# Patient Record
Sex: Male | Born: 1995 | Race: White | Hispanic: No | Marital: Single | Smoking: Never smoker
Health system: Southern US, Community
[De-identification: ages and names within clinical notes are randomized; demographics above are authoritative.]

## PROBLEM LIST (undated history)

## (undated) DIAGNOSIS — F840 Autistic disorder: Secondary | ICD-10-CM

## (undated) DIAGNOSIS — J309 Allergic rhinitis, unspecified: Secondary | ICD-10-CM

## (undated) DIAGNOSIS — E119 Type 2 diabetes mellitus without complications: Secondary | ICD-10-CM

## (undated) DIAGNOSIS — F909 Attention-deficit hyperactivity disorder, unspecified type: Secondary | ICD-10-CM

## (undated) HISTORY — PX: DENTAL REHABILITATION: SHX1449

## (undated) HISTORY — DX: Type 2 diabetes mellitus without complications: E11.9

---
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
## 2017-01-03 ENCOUNTER — Telehealth (INDEPENDENT_AMBULATORY_CARE_PROVIDER_SITE_OTHER): Payer: Self-pay | Admitting: "Endocrinology

## 2017-01-03 NOTE — Telephone Encounter (Signed)
1. I had asked that Ms. Barnes call me this evening between 8:00-9:30 PM. When she had not called by 9:15 I called her. Unfortunately, she was not available.  2. I left a voicemail message asking her to return my call either this evening or tomorrow evening.  Molli KnockMichael Brennan, MD, CDE

## 2017-02-27 ENCOUNTER — Ambulatory Visit (INDEPENDENT_AMBULATORY_CARE_PROVIDER_SITE_OTHER): Payer: Medicaid Other | Admitting: "Endocrinology

## 2017-03-04 ENCOUNTER — Encounter (INDEPENDENT_AMBULATORY_CARE_PROVIDER_SITE_OTHER): Payer: Self-pay | Admitting: "Endocrinology

## 2017-03-04 ENCOUNTER — Ambulatory Visit (INDEPENDENT_AMBULATORY_CARE_PROVIDER_SITE_OTHER): Payer: Medicaid Other | Admitting: "Endocrinology

## 2017-03-04 VITALS — BP 118/76 | HR 90 | Wt 152.4 lb

## 2017-03-04 DIAGNOSIS — IMO0001 Reserved for inherently not codable concepts without codable children: Secondary | ICD-10-CM

## 2017-03-04 DIAGNOSIS — E1065 Type 1 diabetes mellitus with hyperglycemia: Secondary | ICD-10-CM

## 2017-03-04 DIAGNOSIS — E10649 Type 1 diabetes mellitus with hypoglycemia without coma: Secondary | ICD-10-CM | POA: Diagnosis not present

## 2017-03-04 DIAGNOSIS — M25579 Pain in unspecified ankle and joints of unspecified foot: Secondary | ICD-10-CM

## 2017-03-04 DIAGNOSIS — F4320 Adjustment disorder, unspecified: Secondary | ICD-10-CM

## 2017-03-04 DIAGNOSIS — E063 Autoimmune thyroiditis: Secondary | ICD-10-CM

## 2017-03-04 DIAGNOSIS — F840 Autistic disorder: Secondary | ICD-10-CM

## 2017-03-04 DIAGNOSIS — E049 Nontoxic goiter, unspecified: Secondary | ICD-10-CM | POA: Diagnosis not present

## 2017-03-04 LAB — POCT URINALYSIS DIPSTICK
GLUCOSE UA: 1000
Ketones, UA: NEGATIVE

## 2017-03-04 LAB — POCT GLUCOSE (DEVICE FOR HOME USE): POC Glucose: 494 mg/dl — AB (ref 70–99)

## 2017-03-04 NOTE — Progress Notes (Signed)
Subjective:  Patient Name: Johnny [name] Date of Birth: August 05, 1995  MRN: [id number]  Johnny [name]  presents to the office today for follow-up evaluation and management  of his T1DM, hypoglycemia, autism, ADHD, possible mental retardation, goiter, adjustment reaction, and family history of autoimmune thyroid disease.  HISTORY OF PRESENT ILLNESS:   Johnny [name] is a 21 y.o. Caucasian young man.   Johnny [name] was accompanied by his mother and her husband, Johnny [name].  1. Johnny [name] was seen at [facility] in [place], [place], on 06/26/13 for complaints of weight loss, fatigue, personality change, polyuria, polydipsia, and vomiting. New-onset DM and DKA were diagnosed. 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 Significantly abnormal lab results included: Venous pH 7.257, serum potassium 3.0, serum CO2 15, serum glucose 257, hemoglobin A1c 13.7%, C-peptide 0.19 (normal 0.80-3.09), anti-glutamic acid decarboxylase (GAD) antibody > 30 (normal < 1.0), anti-islet cell antibody 80 (normal < 5). TSH was 2.163, free T4 0.82, free T3 1.8 (normal 2.3-4.2). He was treated initially with infusions of insulin and fluids. When his DKA resolved, he was converted to a multiple daily injection (MDI) insulin plan, using Lantus as a basal insulin and Novolog aspart as a bolus insulin at mealtimes, bedtime, and 2 AM if needed. 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   2. Johnny [name] was discharged from [facility] on 07/04/13. He and his mother had our DSSP education program with Ms. Gearldine Bienenstock. 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 When he went into the honeymoon period his Lantus dose was decreased to 6 units at night.  Since then, however, he has exited the honeymoon period and has had a gradual but progressive increase in his exogenous insulin requirement. He started his Dexcom CGM in early 2016. He was converted to an Omnipod pump on 01/16/16.   3. Johnny [name]'s last [facility] visit was on 12/26/16. At that visit I changed several basal rates. Mom says that those changes did help  A. In the interim he has been healthy. His allergies have been active every day. He has not been bothered by headaches.    B. His Omnipod pump and Dexcom have been working well.   C. BGs have been generally lower.    Johnny [name] has become increasingly more interactive with others and has had noticeable improvements in his alertness and his speech, but he remains completely dependent on his mother for all of his basic needs.    4. [name]:  Constitutional: Johnny [name] has been feeling "1 thumb up".   Eyes: Vision seems to be good for distance vision when he wears his new glasses. He is wearing his glasses more often now. He had his last eye appointment in about January 2018. New glasses were prescribed. The doctor did not indicate any other problems with his eyes.  Neck: Johnny [name] has not been touching his neck or complaining of neck pains. There are no other recognized problems of the anterior neck.  Heart: There are no recognized heart problems. The ability to perform physical activities seems normal for his limited  level of activity.  Gastrointestinal: He is still hungry. Bowel movents seem normal. There are no recognized GI problems. Legs: Muscle mass and strength seem normal. He can  perform his usual ADLs and other physical activities without obvious discomfort. He has no numbness, tingling, burning, or pains. No edema is noted.  Feet: He still complains a lot about his feet hurting when he is active. There are no obvious foot problems. No edema is noted.  Neurologic: There are no recognized problems with muscle movement and  strength, sensation, or coordination. Hypoglycemia: Mom says that he has had a few low BGs when he goes too long without eating or is unusually physically active. Mom says that he had a 57, but she can't remember when.      5. BG meter printout: He checks his BGs 5-8 times daily. Average BG was 182, compared with 232 at his last visit. BG range was 73-397, compared with 30-450 at his last visit. Average morning BG was 156, lunch 190, dinner 194, and bedtime 183. He was on vacation last week, was eating out at restaurants most of the time, and his BGs were higher, especially on 02/28/17.   6. Dexcom printout: The sensor shows that his skin glucose values correlate pretty well with his BGs. His BGs were definitely higher while on vacation, but much lower and less variable before vacation.     PAST MEDICAL, FAMILY, AND SOCIAL HISTORY    Past Medical History:  Diagnosis Date  . ADHD (attention deficit hyperactivity disorder)   . Allergic rhinitis   . Autism   . Diabetes mellitus without complication (HCC)     Family History  Problem Relation Age of Onset  . Hypothyroidism Mother   . Kidney disease Mother        Mom unsure what her kidney issues are  . Autism Sister   . Mental retardation Sister   . Cancer Maternal Grandfather   . Diabetes Paternal Grandmother        Died at age 51.  . Other Other        Paternal great aunt with "hypoglycemia" unsure why     Current Outpatient Prescriptions:  .  ACCU-CHEK FASTCLIX LANCETS MISC, CHECK BLOOD SUGAR 6 TIMES DAILY, Disp: 204 each, Rfl: 6 .  clindamycin-benzoyl peroxide (BENZACLIN) gel, Apply 1 application topically at bedtime., Disp: , Rfl:  .  doxycycline (VIBRA-TABS) 100 MG tablet, Take 100 mg by mouth daily. Continuous for acne, Disp: , Rfl:  .  GLUCAGEN HYPOKIT 1 MG SOLR injection, INJECT 1 SYRINGE IN THE MUSCLE IF UNRESPONSIVE, UNABLE TO SWALLOW, UNCONSCIOUS AND/OR HAS SEIZURE FOR SEVERE HYPOGLYCEMIA, Disp: 2 mg, Rfl: 0 .  insulin  aspart (NOVOLOG) 100 UNIT/ML injection, Use 200 units in insulin pump every 48 hours (Patient taking differently: Inject into the skin See admin instructions. Use 200 units in insulin pump every 48 hours), Disp: 40 mL, Rfl: 6 .  montelukast (SINGULAIR) 10 MG tablet, Take 10 mg by mouth daily., Disp: , Rfl:  .  tretinoin (RETIN-A) 0.025 % cream, Apply topically at bedtime. Apply sparingly dor acne. May start twice a week and then increase to daily in 1-2 weeks, Disp: 45 g, Rfl: 1 .  Urine Glucose-Ketones Test STRP, If urine output and extreme thirst increases, collect urine and check with strip as directed. Disp 1 vial, Disp: 20 each, Rfl: 3 .  ACCU-CHEK SMARTVIEW test strip, TEST BLOOD SUGAR 6 TIMES A DAY (Patient not taking: Reported on 12/26/2016), Disp: 200 each, Rfl: 6 .  BD PEN NEEDLE NANO U/F 32G X 4 MM MISC, INJECT INSULIN 7 TIMES DAILY (Patient not taking: Reported on 12/26/2016), Disp: 300 each,  Rfl: 0 .  diphenhydrAMINE (BENADRYL) 25 MG tablet, Take 50 mg by mouth every 6 (six) hours as needed for itching or allergies., Disp: , Rfl:  .  hydrOXYzine (ATARAX/VISTARIL) 25 MG tablet, Take 50 mg by mouth at bedtime as needed for anxiety (sleep). Reported on 12/21/2015, Disp: , Rfl:  .  LANTUS SOLOSTAR 100 UNIT/ML Solostar Pen, INJECT UP TO 50 UNITS UNDER THE SKIN EVERY DAY AS DIRECTED (Patient not taking: Reported on 06/27/2016), Disp: 15 mL, Rfl: 0 .  NOVOLOG FLEXPEN 100 UNIT/ML FlexPen, INJECT UP TO 50 UNITS UNDER THE SKIN EVERYDAY (Patient not taking: Reported on 09/25/2016), Disp: 15 mL, Rfl: 0  Allergies as of 03/04/2017  . (No Known Allergies)     reports that he has never smoked. He has never used smokeless tobacco. He reports that he does not drink alcohol. Pediatric History  Patient Guardian Status  . Mother:  [name]   Other Topics Concern  . Not on file   Social History Narrative   Lives with Mom and Sister (17y).  Mom quit smoking 18 years ago.  She has 2 other grown children.   They have a dog at home.  No recent travel.   1. School and Family: He will in the 12th grade again in a self-contained special education program. He has health insurance through [facility]. Mom has new health insurance as well. She has also lost 70 pounds. Mr. Zachery Dauer is back living with the family.  2. Activities: His play and video games.  3. Primary Care Provider: His medical home is the [facility]. He has a new PCP now, but mom can't remember her name.  4. Psychiatrist: None at present  REVIEW OF SYSTEMS: There are no other significant problems involving Johnny [name]'s other body systems.   Objective:  Vital Signs:  BP 118/76   Pulse 90   Wt 152 lb 6.4 oz (69.1 kg)   BMI 23.87 kg/m    Ht Readings from Last 3 Encounters:  11/11/16 5\' 7"  (1.702 m) (19 %, Z= -0.88)*  09/25/16 5' 6.73" (1.695 m) (17 %, Z= -0.97)*  06/27/16 5' 6.77" (1.696 m) (18 %, Z= -0.93)*   * Growth percentiles are based on CDC 2-20 Years data.   Wt Readings from Last 3 Encounters:  03/04/17 152 lb 6.4 oz (69.1 kg) (50 %, Z= 0.00)*  12/26/16 148 lb (67.1 kg) (44 %, Z= -0.15)*  11/11/16 145 lb (65.8 kg) (40 %, Z= -0.26)*   * Growth percentiles are based on CDC 2-20 Years data.   HC Readings from Last 3 Encounters:  No data found for [facility]   Body surface area is 1.81 meters squared.  No height on file for this encounter. 50 %ile (Z= 0.00) based on CDC 2-20 Years weight-for-age data using vitals from 03/04/2017. No head circumference on file for this encounter.   PHYSICAL EXAM:  Constitutional: The patient appears healthy and well nourished. His height is plateauing. He gained 4 pounds since last visit. He again played with his video game during most of today's clinic visit. He engaged very well with me today. He was very friendly, upbeat, and polite. He continues to improve in terms of being more interactive with others. His speech is better today. His affect was quite normal today, but his insight  remains fairly limited. Head: The head is normocephalic. Face: His pustular acne has improved. There are no obvious dysmorphic features. He has a grade 2-3 mustache and more of a goatee  today.   Eyes: The eyes appear to be normally formed and spaced. Gaze is conjugate. There is no obvious arcus or proptosis. Moisture appears normal. Ears: The ears are normally placed and appear externally normal. Mouth: The oropharynx and tongue appear normal. Dentition appears to be normal for age. Oral moisture is normal. Neck: The neck appears to be visibly normal. No carotid bruits are noted. His strap muscles are larger. The thyroid gland is again enlarged at about 22-23 grams in size. Both lobes are symmetrically enlarged today. The consistency of the thyroid gland is relatively firm.  The thyroid gland is not tender to palpation. Lungs: The lungs are clear to auscultation. Air movement is good. Heart: Heart rate and rhythm are regular. Heart sounds S1 and S2 are normal. I did not appreciate any pathologic cardiac murmurs. Abdomen: The abdomen is normal in size for the patient's age, but larger. Bowel sounds are normal. There is no obvious hepatomegaly, splenomegaly, or other mass effect.  Arms: Muscle size and bulk are normal for age. Hands: There is no obvious tremor. Phalangeal and metacarpophalangeal joints are normal. Palmar muscles are normal for age. Palmar skin is normal. Palmar moisture is also normal. Legs: Muscles appear normal for age. No edema is present. Feet: Feet are normally formed. Dorsalis pedal pulses are 1+ bilaterally.  I do not see any tinea pedis.  Neurologic: Strength is fairly normal for age in both the upper and lower extremities. Muscle tone is normal. Sensation to touch is normal in both the legs and feet.    LAB DATA: Results for orders placed or performed in visit on 03/04/17 (from the past 504 hour(s))  POCT Glucose (Device for Home Use)   Collection Time: 03/04/17 10:30 AM   Result Value Ref Range   Glucose Fasting, POC  70 - 99 mg/dL   POC Glucose 161 (A) 70 - 99 mg/dl    Labs 0/96/04: CBG 540. He had not bolused after breakfast today due to mom putting in a new pod just before they left home to drive to his appointment today.  Labs 12/26/16: HbA1c 10.2%, CBG 289  Labs 09/25/16: HbA1c 9.5%, CBG 261  Labs 06/27/16: CBG 114; TSH 1.54, free T4 1.1, free T3 3.4; CMP normal; urine microalbumin/creatinine ratio 2; cholesterol 108, triglycerides 153, HDL 43, LDL 34  Labs 04/10/16: HbA1c 9.5%, CBG 145  Labs 02/01/16: CBG 391, TSH 1.72  Labs 12/21/15: HbA1c 8.4%  Labs 10/10/15: HbA1c 8.2%.  Labs 08/09/15: HbA1c 8.5%  Labs 1012/16: HbA1c 8.8%; TSH 2.624, free T4 0.99, free T3 3.4; urine microalbumin/creatinine ratio <0.2; CMP normal  Labs 01/27/15: HbA1c 8.8%  Labs 10/25/14: Hemoglobin A1c 8.2%, compared with 7.9% at the last visit and with 8.4% at the prior visit.    Labs 10/22/13: CMP normal; C-peptide 0.65, increased from 0.19 at diagnosis (normal 0.80-3.90); TSH 1.265, free T4 1.01, free T3 3.4, TPO antibody 10.4    Assessment and Plan:   ASSESSMENT:  1. T1DM:   A. Since exiting the honeymoon period, Johnny [name] has required more and more exogenous insulin than he needed before. His BGs had been much more variable and had increased overall through his last visit.    B. In the past two months, however, his BGs have significantly improved, in part due to increases in basal rates, in part due to increased physical activity, and in part due to him cooperating more with mom in terms of what he eats and how much he eats. Mom is also doing  daily food logs that help her understand why BGs can change more at some times than at others. Mom continues to do a wonderful job of supporting and supervising Johnny [name].  2. Hypoglycemia: He has not had any documented low BGs this month, but mom thinks that he did have a 49 since his last visit.  3-4. Goiter/thyroiditis:   A. He has the  personal history of autoimmune T1DM. He also has the family history of autoimmune thyroid disease.  B. He continues to have waxing and waning of thyroid gland size. At his last visit the thyroid gland was larger overall, but is somewhat smaller today.   C. The pattern of waxing and waning of thyroid gland size and thyroid lobe sizes is c/w evolving Hashimoto's thyroiditis. It is almost a certainty that he will become hypothyroid in the future.   D.  He was euthyroid in April 2015 and in October 2016. His TSH was mid-range euthyroid in both July and December 2017.  5. Adjustment reaction: Things have been going pretty well in the past 3 months. Mom and Johnny [name] are working well together.     6. Autism/ADHD/ mental retardation: Kalven's autism continues to improve in that he is more social and outgoing. However, he remains completely dependent upon mom for his meals, shelter, clothing, recreational activities, physical activities, and T1DM care. 7. Dehydration: He looks well hydrated today. It appears that when Johnny [name]'s BGs are high he sometimes does not drink enough liquid to meet his needs, and so becomes dehydrated.  8. Foot pain: Johnny [name] appears to need better arch support than his sandals are providing now.   PLAN:  1. Diagnostic: Call Dr. Fransico MichaelBrennan on Sunday evening in mid-September to discuss BGs.  2. Therapeutic: Continue Small bedtime snack.  Continue current pump settings, to include his current basal rates: MN: 1.25 4 AM: 1.30 8 AM: 1.10 New Noon: 0.90 6 PM: 1.15 3. Patient education: We discussed his elevated BGs, his hypoglycemia, his goiter, and all of the above at length. Mom will call me if Johnny [name] has any BGs during the night or in the mornings that are <80 and unexplained.  4. Follow-up: two months  Level of Service: This visit lasted in excess of 50 minutes. More than 50% of the visit was devoted to counseling.   Molli KnockMichael Brennan, MD, CDE Adult and Pediatric  Endocrinology

## 2017-03-04 NOTE — Patient Instructions (Addendum)
Follow up visit in 2 months. Call on a Sunday evening in mid-September between 8:00-9:30 PM to discuss BGs.

## 2017-04-09 ENCOUNTER — Other Ambulatory Visit (INDEPENDENT_AMBULATORY_CARE_PROVIDER_SITE_OTHER): Payer: Self-pay | Admitting: "Endocrinology

## 2017-04-09 DIAGNOSIS — IMO0001 Reserved for inherently not codable concepts without codable children: Secondary | ICD-10-CM

## 2017-04-09 DIAGNOSIS — E1065 Type 1 diabetes mellitus with hyperglycemia: Principal | ICD-10-CM

## 2017-04-18 ENCOUNTER — Telehealth (INDEPENDENT_AMBULATORY_CARE_PROVIDER_SITE_OTHER): Payer: Self-pay | Admitting: "Endocrinology

## 2017-04-18 NOTE — Telephone Encounter (Signed)
Returned TC to [place] to check on [place], mom said that he is doing better no ketones but his sugars are still up. Advised to continue to check his bg and give boluses.

## 2017-04-18 NOTE — Telephone Encounter (Signed)
Spoke with Ms. Johnny [name], she said his pod stopped working last night and he did not tell her until this morning. She changed his Pod with new insulin and gave him a 15 unit bolus, but was concern because his sugar was reading Hi and had +80 Ketones. Now his Bg is 484, advise to follow DKA protocol, continue to give him fluids and check his urine ketones. Mom ok with information given. Advised that I will check back with her later today to see how he is doing.

## 2017-04-18 NOTE — Telephone Encounter (Signed)
°  Who's calling (name and relationship to patient) : [name], mother Best contact number: [phone] Provider they see: Fransico Michael Reason for call: Stating patient's ketones are 80 and blood sugar is high. Routing phone call to [place].     PRESCRIPTION REFILL ONLY  Name of prescription:  Pharmacy:

## 2017-05-07 ENCOUNTER — Ambulatory Visit (INDEPENDENT_AMBULATORY_CARE_PROVIDER_SITE_OTHER): Payer: Medicaid Other | Admitting: "Endocrinology

## 2017-05-07 ENCOUNTER — Encounter (INDEPENDENT_AMBULATORY_CARE_PROVIDER_SITE_OTHER): Payer: Self-pay | Admitting: "Endocrinology

## 2017-05-07 VITALS — BP 98/68 | HR 80 | Ht 66.93 in | Wt 153.6 lb

## 2017-05-07 DIAGNOSIS — E049 Nontoxic goiter, unspecified: Secondary | ICD-10-CM

## 2017-05-07 DIAGNOSIS — IMO0001 Reserved for inherently not codable concepts without codable children: Secondary | ICD-10-CM

## 2017-05-07 DIAGNOSIS — F4320 Adjustment disorder, unspecified: Secondary | ICD-10-CM

## 2017-05-07 DIAGNOSIS — E860 Dehydration: Secondary | ICD-10-CM | POA: Diagnosis not present

## 2017-05-07 DIAGNOSIS — E10649 Type 1 diabetes mellitus with hypoglycemia without coma: Secondary | ICD-10-CM

## 2017-05-07 DIAGNOSIS — E1065 Type 1 diabetes mellitus with hyperglycemia: Secondary | ICD-10-CM

## 2017-05-07 LAB — POCT GLUCOSE (DEVICE FOR HOME USE): POC Glucose: 246 mg/dl — AB (ref 70–99)

## 2017-05-07 LAB — POCT GLYCOSYLATED HEMOGLOBIN (HGB A1C): Hemoglobin A1C: 9.3

## 2017-05-07 NOTE — Progress Notes (Signed)
Subjective:  Patient Name: Johnny [name] Date of Birth: 1996/02/08  MRN: [id number]  Johnny [name]  presents to the office today for follow-up evaluation and management  of his T1DM, hypoglycemia, autism, ADHD, possible mental retardation, goiter, adjustment reaction, and family history of autoimmune thyroid disease.  HISTORY OF PRESENT ILLNESS:   Johnny [name] is a 21 y.o. Caucasian young man.   Johnny [name] was accompanied by his mother and her husband, Johnny [name].  1. Johnny [name] was seen at [facility] in [place], [place], on 06/26/13 for complaints of weight loss, fatigue, personality change, polyuria, polydipsia, and vomiting. New-onset DM and DKA were diagnosed. 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 Significantly abnormal lab results included: Johnny [name] 7.257, serum potassium 3.0, serum CO2 15, serum glucose 257, hemoglobin A1c 13.7%, C-peptide 0.19 (normal 0.80-3.09), anti-glutamic acid decarboxylase (GAD) antibody > 30 (normal < 1.0), anti-islet cell antibody 80 (normal < 5). TSH was 2.163, free T4 0.82, free T3 1.8 (normal 2.3-4.2). He was treated initially with infusions of insulin and fluids. When his DKA resolved, he was converted to a multiple daily injection (MDI) insulin plan, using Lantus as a basal insulin and Novolog aspart as a bolus insulin at mealtimes, bedtime, and 2 AM if needed. 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   2. [name] was discharged from [facility] on 07/04/13. He and his mother had our DSSP education program with Ms. Gearldine Bienenstock. 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 When he went into the honeymoon period his Lantus dose was decreased to 6 units at night.  Since then, however, he has exited the honeymoon period and has had a gradual but progressive increase in his exogenous insulin requirement. He started his Dexcom CGM in early 2016. He was converted to an Omnipod pump on 01/16/16.   3. [name]'s last [facility] visit was on 03/04/17. At that visit I continued his current insulin pump settings.   A. In the interim he has been healthy. At school he had a few high BGs and the school wanted to call EMS. BGs were High.   B. His allergies have been active every day. He has not been bothered by headaches.    C. His Omnipod pump has been working well. His Dexcom 5 is not working so well. His SGs and BGs can be 50-100 points off. BGs have been generally good, but he has had more higher BGs than low BGs.  [place] [name] has become increasingly more interactive with others and has had noticeable improvements in his alertness and his speech, but he still remains completely dependent on his mother for all of his basic needs.    4. [name]:  Constitutional: Johnny [name] has been feeling "1 thumb up".   Eyes: Vision seems to be good for distance vision when he wears his new glasses. He is wearing his glasses more often now. He had his last eye appointment in about January 2018. New glasses were prescribed. The doctor did not indicate any other problems with his eyes.  Neck: Johnny [name] has not been touching his neck or complaining of neck pains. There are no other recognized problems of the anterior neck.  Heart: There are no recognized heart problems. The ability  to perform physical activities seems normal for his limited  level of activity.  Gastrointestinal: He is still hungry. Bowel movents seem normal. There are no recognized GI problems. Legs: Muscle mass and strength seem normal. He can perform his usual ADLs and other physical activities without obvious discomfort. He has no numbness, tingling, burning, or pains. No edema is noted.  Feet: There are no obvious  foot problems. No edema is noted.  Neurologic: There are no recognized problems with muscle movement and strength, sensation, or coordination. Hypoglycemia: Johnny [name] says that he has had a few low BGs.        5. BG meter printout: He checks his BGs 5-8 times daily. Average BG was 219, compared with 182 at his last visit and with 232 at his prior visit. BG range was 70-High, compared with 73-397 at the last visit and with 30-450 at his prior visit. Mother has noted that certain foods and drinks cause higher BGs, such as chocolate milk.   6. Dexcom printout: The sensor shows that his skin glucose values correlate pretty well with his BGs. His SGs were often higher during the night and pretty good in the afternoons.   PAST MEDICAL, FAMILY, AND SOCIAL HISTORY    Past Medical History:  Diagnosis Date  . ADHD (attention deficit hyperactivity disorder)   . Allergic rhinitis   . Autism   . Diabetes mellitus without complication (HCC)     Family History  Problem Relation Age of Onset  . Hypothyroidism Mother   . Kidney disease Mother        Johnny [name] unsure what her kidney issues are  . Autism Sister   . Mental retardation Sister   . Cancer Maternal Grandfather   . Diabetes Paternal Grandmother        Died at age 66.  . Other Other        Paternal great aunt with "hypoglycemia" unsure why     Current Outpatient Prescriptions:  .  ACCU-CHEK FASTCLIX LANCETS MISC, CHECK BLOOD SUGAR 6 TIMES DAILY, Disp: 204 each, Rfl: 6 .  clindamycin-benzoyl peroxide (BENZACLIN) gel, Apply 1 application topically at bedtime., Disp: , Rfl:  .  diphenhydrAMINE (BENADRYL) 25 MG tablet, Take 50 mg by mouth every 6 (six) hours as needed for itching or allergies., Disp: , Rfl:  .  GLUCAGEN HYPOKIT 1 MG SOLR injection, INJECT 1 SYRINGE IN THE MUSCLE IF UNRESPONSIVE, UNABLE TO SWALLOW, UNCONSCIOUS AND/OR HAS SEIZURE FOR SEVERE HYPOGLYCEMIA, Disp: 2 mg, Rfl: 0 .  hydrOXYzine (ATARAX/VISTARIL) 25 MG tablet, Take 50 mg by  mouth at bedtime as needed for anxiety (sleep). Reported on 12/21/2015, Disp: , Rfl:  .  NOVOLOG 100 UNIT/ML injection, ADMINISTER 200 UNITS VIA INSULIN PUMP EVERY 48 HOURS, Disp: 40 mL, Rfl: 5 .  tretinoin (RETIN-A) 0.025 % cream, Apply topically at bedtime. Apply sparingly dor acne. May start twice a week and then increase to daily in 1-2 weeks, Disp: 45 g, Rfl: 1 .  Urine Glucose-Ketones Test STRP, If urine output and extreme thirst increases, collect urine and check with strip as directed. Disp 1 vial, Disp: 20 each, Rfl: 3 .  doxycycline (VIBRA-TABS) 100 MG tablet, Take 100 mg by mouth daily. Continuous for acne, Disp: , Rfl:   Allergies as of 05/07/2017  . (No Known Allergies)     reports that he has never smoked. He has never used smokeless tobacco. He reports that he does not drink alcohol. Pediatric History  Patient Guardian Status  . Mother:  Barnes,Elaine   Other Topics Concern  . Not on file   Social History Narrative   Lives with Johnny [name] and Sister (17y).  Johnny [name] quit smoking 18 years ago.  She has 2 other grown children.  They have a dog at home.  No recent travel.   1. 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 He has health insurance through [facility]. Johnny [name] has new health insurance as well. Mr. Zachery Dauer is back living with the family.  2. Activities: His play and video games.  3. Primary Care Provider: His medical home is the [facility]. He has a new PCP now, but Johnny [name] can't remember her name.  4. Psychiatrist: None at present  REVIEW OF SYSTEMS: There are no other significant problems involving Areeb's other body systems.   Objective:  Vital Signs:  BP 98/68   Pulse 80   Ht 5' 6.93" (1.7 m)   Wt 153 lb 9.6 oz (69.7 kg)   BMI 24.11 kg/m    Ht Readings from Last 3 Encounters:  05/07/17 5' 6.93" (1.7 m) (18 %, Z= -0.93)*  11/11/16  (1.702 m) (19 %, Z= -0.88)*  09/25/16 5' 6.73" (1.695 m) (17 %, Z= -0.97)*    * Growth percentiles are based on CDC 2-20 Years data.   Wt Readings from Last 3 Encounters:  05/07/17 153 lb 9.6 oz (69.7 kg) (51 %, Z= 0.02)*  03/04/17 152 lb 6.4 oz (69.1 kg) (50 %, Z= 0.00)*  12/26/16 148 lb (67.1 kg) (44 %, Z= -0.15)*   * Growth percentiles are based on CDC 2-20 Years data.   HC Readings from Last 3 Encounters:  No data found for [facility]   Body surface area is 1.81 meters squared.  18 %ile (Z= -0.93) based on CDC 2-20 Years stature-for-age data using vitals from 05/07/2017. 51 %ile (Z= 0.02) based on CDC 2-20 Years weight-for-age data using vitals from 05/07/2017. No head circumference on file for this encounter.   PHYSICAL EXAM:  Constitutional: The patient appears healthy and well nourished. His height is plateauing. He gained 1 pound since last visit. He again played with his video game during most of today's clinic visit. He engaged very well with me today. He was very friendly, upbeat, and polite. He continues to improve in terms of being more interactive with others. His speech is better today. His affect was quite normal today, but his insight remains fairly limited. Head: The head is normocephalic. Face: His pustular acne has improved. There are no obvious dysmorphic features. He has a grade 2-3 mustache and a goatee today.   Eyes: The eyes appear to be normally formed and spaced. Gaze is conjugate. There is no obvious arcus or proptosis. Moisture appears normal. Ears: The ears are normally placed and appear externally normal. Mouth: The oropharynx and tongue appear normal. Dentition appears to be normal for age. Oral moisture is normal. Neck: The neck appears to be visibly normal. No carotid bruits are noted. His strap muscles are larger. The thyroid gland is again enlarged, but smaller, at about 21-22 grams in size. Both lobes are symmetrically enlarged today. The consistency of the thyroid gland is relatively firm.  The thyroid gland is not tender to  palpation. Lungs: The lungs are clear to auscultation. Air movement is good. Heart: Heart rate and rhythm are regular. Heart sounds S1 and S2 are normal. I did not appreciate any pathologic cardiac murmurs. Abdomen: The abdomen is  normal in size for the patient's age, but larger. Bowel sounds are normal. There is no obvious hepatomegaly, splenomegaly, or other mass effect.  Arms: Muscle size and bulk are normal for age. Hands: There is no obvious tremor. Phalangeal and metacarpophalangeal joints are normal. Palmar muscles are normal for age. Palmar skin is normal. Palmar moisture is also normal. Legs: Muscles appear normal for age. No edema is present. Feet: Feet are normally formed. Dorsalis pedal pulses are 1+ bilaterally.  I do not see any tinea pedis.  Neurologic: Strength is fairly normal for age in both the upper and lower extremities. Muscle tone is normal. Sensation to touch is normal in both the legs and feet.    LAB DATA: Results for orders placed or performed in visit on 05/07/17 (from the past 504 hour(s))  POCT Glucose (Device for Home Use)   Collection Time: 05/07/17  1:39 PM  Result Value Ref Range   Glucose Fasting, POC  70 - 99 mg/dL   POC Glucose 161 (A) 70 - 99 mg/dl  POCT HgB W9U   Collection Time: 05/07/17  1:49 PM  Result Value Ref Range   Hemoglobin A1C 9.3     Labs 05/07/17: HbA1c 9.3%, CBG 246  Labs 03/04/17: CBG 494. He had not bolused after breakfast due to Johnny [name] putting in a new pod just before they left home to drive to his appointment today.  Labs 12/26/16: HbA1c 10.2%, CBG 289  Labs 09/25/16: HbA1c 9.5%, CBG 261  Labs 06/27/16: CBG 114; TSH 1.54, free T4 1.1, free T3 3.4; CMP normal; urine microalbumin/creatinine ratio 2; cholesterol 108, triglycerides 153, HDL 43, LDL 34  Labs 04/10/16: HbA1c 9.5%, CBG 145  Labs 02/01/16: CBG 391, TSH 1.72  Labs 12/21/15: HbA1c 8.4%  Labs 10/10/15: HbA1c 8.2%.  Labs 08/09/15: HbA1c 8.5%  Labs 1012/16: HbA1c 8.8%; TSH  2.624, free T4 0.99, free T3 3.4; urine microalbumin/creatinine ratio <0.2; CMP normal  Labs 01/27/15: HbA1c 8.8%  Labs 10/25/14: Hemoglobin A1c 8.2%, compared with 7.9% at the last visit and with 8.4% at the prior visit.    Labs 10/22/13: CMP normal; C-peptide 0.65, increased from 0.19 at diagnosis (normal 0.80-3.90); TSH 1.265, free T4 1.01, free T3 3.4, TPO antibody 10.4    Assessment and Plan:   ASSESSMENT:  1. T1DM:   A. Since exiting the honeymoon period, [place] has required more and more exogenous insulin than he needed before. His BGs had been much more variable and had increased overall through his last visit.    B. In the past two months, however, his HbA1c has improved, but his BGs have been higher. BGs have been higher in part due to some incorrect carb counts.    C. He appears to need a higher basal rate during the night.  2. Hypoglycemia: He has not had any documented low BGs this month, but Johnny [name] thinks that he did have several since his last visit.  3-4. Goiter/thyroiditis:   A. He has the personal history of autoimmune T1DM. He also has the family history of autoimmune thyroid disease.  B. He continues to have waxing and waning of thyroid gland size. At his last visit the thyroid gland was smaller overall, but is somewhat smaller today.   C. The pattern of waxing and waning of thyroid gland size and thyroid lobe sizes is c/w evolving Hashimoto's thyroiditis. It is almost a certainty that he will become hypothyroid in the future.   D.  He was euthyroid in April 2015 and  in October 2016. His TSH was mid-range euthyroid in both July and December 2017.  5. Adjustment reaction: Johnny [name] had a minor meltdown in the office. She had forgotten how to handle hyperglycemia and ketones. She had also forgotten that she has both the Hyperglycemia Protocol and the DKA Protocol in her binder at home. She was so worked up that it took awhile to get her calmed down. Mr. Zachery Dauer was very helpful in the  process. I honestly do not know what caused her to have so much anxiety.  6. Autism/ADHD/ mental retardation: Mirza's autism continues to improve in that he is more social and outgoing. However, he remains completely dependent upon Johnny [name] for his meals, shelter, clothing, recreational activities, physical activities, and T1DM care. 7. Dehydration: He looks well hydrated today. It appears that when Elson's BGs are high he sometimes does not drink enough liquid to meet his needs, and so becomes dehydrated.  8. Foot pain: Not active today   PLAN:  1. Diagnostic: Continue BG checks as planned.  2. Therapeutic: Continue Small bedtime snack.  New basal rates: MN: 1.25 ->1.30 4 AM: 1.30 -> 1.35 8 AM: 1.10 New Noon: 0.90 6 PM: 1.15 3. Patient education: We discussed his elevated BGs, his hypoglycemia, his goiter, and all of the above at length. Johnny [name] will call me if Baylee has any BGs during the night or in the mornings that are <80 and unexplained. Keep a log of unusual high BGs or lower BGs and the causes. 4. Follow-up: two months  Level of Service: This visit lasted in excess of 50 minutes. More than 50% of the visit was devoted to counseling.   [name], MD, CDE Adult and Pediatric Endocrinology

## 2017-05-07 NOTE — Patient Instructions (Signed)
Follow up visit in 2 months.  

## 2017-05-08 ENCOUNTER — Encounter (INDEPENDENT_AMBULATORY_CARE_PROVIDER_SITE_OTHER): Payer: Self-pay | Admitting: "Endocrinology

## 2017-07-24 ENCOUNTER — Encounter (INDEPENDENT_AMBULATORY_CARE_PROVIDER_SITE_OTHER): Payer: Self-pay | Admitting: "Endocrinology

## 2017-07-24 ENCOUNTER — Ambulatory Visit (INDEPENDENT_AMBULATORY_CARE_PROVIDER_SITE_OTHER): Payer: Medicaid Other | Admitting: "Endocrinology

## 2017-07-24 VITALS — BP 108/72 | HR 84 | Ht 67.0 in | Wt 154.2 lb

## 2017-07-24 DIAGNOSIS — E10649 Type 1 diabetes mellitus with hypoglycemia without coma: Secondary | ICD-10-CM | POA: Diagnosis not present

## 2017-07-24 DIAGNOSIS — IMO0001 Reserved for inherently not codable concepts without codable children: Secondary | ICD-10-CM

## 2017-07-24 DIAGNOSIS — E063 Autoimmune thyroiditis: Secondary | ICD-10-CM | POA: Diagnosis not present

## 2017-07-24 DIAGNOSIS — E1065 Type 1 diabetes mellitus with hyperglycemia: Secondary | ICD-10-CM

## 2017-07-24 DIAGNOSIS — E049 Nontoxic goiter, unspecified: Secondary | ICD-10-CM

## 2017-07-24 DIAGNOSIS — E1042 Type 1 diabetes mellitus with diabetic polyneuropathy: Secondary | ICD-10-CM

## 2017-07-24 DIAGNOSIS — Z23 Encounter for immunization: Secondary | ICD-10-CM

## 2017-07-24 DIAGNOSIS — F840 Autistic disorder: Secondary | ICD-10-CM

## 2017-07-24 LAB — POCT GLUCOSE (DEVICE FOR HOME USE): POC Glucose: 263 mg/dl — AB (ref 70–99)

## 2017-07-24 NOTE — Progress Notes (Signed)
Subjective:  Patient Name: Johnny [name] Date of Birth: March 28, 1996  MRN: [id number]  Johnny [name]  presents to the office today for follow-up evaluation and management  of his T1DM, hypoglycemia, autism, ADHD, possible mental retardation, goiter, thyroiditis, adjustment reaction, and family history of autoimmune thyroid disease.  HISTORY OF PRESENT ILLNESS:   [name] is a 22 y.o. Caucasian young man.   [name] was accompanied by his mother.  1. [name] was seen at [facility] in [place], [place], on 06/26/13 for complaints of weight loss, fatigue, personality change, polyuria, polydipsia, and vomiting. New-onset DM and DKA were diagnosed. He received fluids at the ED at [facility], was transferred to [facility], and was admitted to the PICU for evaluation and management of new-onset T1DM, DKA, dehydration, and ketonuria in the setting of pre-existing autism, ADHD, and possible mental retardation. Significantly abnormal lab results included: Venous pH 7.257, serum potassium 3.0, serum CO2 15, serum glucose 257, hemoglobin A1c 13.7%, C-peptide 0.19 (normal 0.80-3.09), anti-glutamic acid decarboxylase (GAD) antibody > 30 (normal < 1.0), anti-islet cell antibody 80 (normal < 5). TSH was 2.163, free T4 0.82, free T3 1.8 (normal 2.3-4.2). He was treated initially with infusions of insulin and fluids. When his DKA resolved, he was converted to a multiple daily injection (MDI) insulin plan, using Lantus as a basal insulin and Novolog aspart as a bolus insulin at mealtimes, bedtime, and 2 AM if needed. 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   2. [name] was discharged from [facility] on 07/04/13. He and his mother participated in our DSSP education program with Ms. Gearldine Bienenstock, RN.. 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 When he went into the honeymoon period his Lantus dose was decreased to 6 units at night.  Since then, however, he has exited the honeymoon period and has had a gradual but progressive increase in his exogenous insulin requirement. He started his Dexcom G5 CGM in early 2016. He was converted to an Omnipod pump on 01/16/16.   3. [name]'s last [facility] visit was on 05/07/17. At that visit I increased his basal rates at midnight and 4 AM.    A. In the interim he has been healthy. His BGs have been better.    B. His allergies/nasal sensitivities have been active with every weather change. He has not been bothered by headaches.    C. His Omnipod pump has been working well. His Dexcom G5 is working well. His SGs and BGs correlate better.   [place] [name] has become increasingly more interactive with others. His speech has improved very much. Unfortunately, however, he still remains completely dependent on his mother for all of his basic needs.    4. [name]:  Constitutional: Kamin feels "good".   Eyes: Vision seems to be good for distance vision when he wears his new glasses. He is wearing his glasses more often now. He had his last eye appointment in about January 2018. The eye doctor did not indicate any other problems with his eyes.  Neck: [name] has not been touching his neck or complaining of neck pains. There are no other recognized problems of the anterior neck.  Heart: There are no recognized heart problems. The ability to perform physical activities seems normal for his limited  level of physical activity.  Gastrointestinal: He is still very hungry. Bowel movents seem normal. There are no recognized GI problems. Legs: Muscle mass and strength  seem normal. He can perform his usual ADLs and other physical activities without obvious discomfort. He has no numbness, tingling, burning, or pains. No edema is noted.  Feet: There are no obvious foot problems. No edema is noted.  Neurologic: There are no recognized problems with muscle movement and strength, sensation, or  coordination. Hypoglycemia: Mom says that he has had a few low BGs.       5. BG meter printout: He checks his BGs 3-7 times daily. Average BG was 180, compared with 219 at his last visit and with 182 at his prior visit. BG range was 73-430, compared with 70-High at his last visit and with 73-397 at his prior visit. His highest BGs occur at lunchtime. His lowest  BGs occur in the evenings after dinner.   6. Dexcom printout: The sensor shows that his skin glucose values correlate well with his BGs. His average SG was also 180. His SGs were often higher during the night and good in the afternoons. Mom says that he often eats late at night. He also sometimes eats without taking insulin in the mornings, so if mom was sleeping in when he ate, his SGs at lunch have been higher.   PAST MEDICAL, FAMILY, AND SOCIAL HISTORY    Past Medical History:  Diagnosis Date  . ADHD (attention deficit hyperactivity disorder)   . Allergic rhinitis   . Autism   . Diabetes mellitus without complication (HCC)     Family History  Problem Relation Age of Onset  . Hypothyroidism Mother   . Kidney disease Mother        Mom unsure what her kidney issues are  . Autism Sister   . Mental retardation Sister   . Cancer Maternal Grandfather   . Diabetes Paternal Grandmother        Died at age 20.  . Other Other        Paternal great aunt with "hypoglycemia" unsure why     Current Outpatient Medications:  .  ACCU-CHEK FASTCLIX LANCETS MISC, CHECK BLOOD SUGAR 6 TIMES DAILY, Disp: 204 each, Rfl: 6 .  clindamycin-benzoyl peroxide (BENZACLIN) gel, Apply 1 application topically at bedtime., Disp: , Rfl:  .  diphenhydrAMINE (BENADRYL) 25 MG tablet, Take 50 mg by mouth every 6 (six) hours as needed for itching or allergies., Disp: , Rfl:  .  doxycycline (VIBRA-TABS) 100 MG tablet, Take 100 mg by mouth daily. Continuous for acne, Disp: , Rfl:  .  GLUCAGEN HYPOKIT 1 MG SOLR injection, INJECT 1 SYRINGE IN THE MUSCLE IF  UNRESPONSIVE, UNABLE TO SWALLOW, UNCONSCIOUS AND/OR HAS SEIZURE FOR SEVERE HYPOGLYCEMIA, Disp: 2 mg, Rfl: 0 .  hydrOXYzine (ATARAX/VISTARIL) 25 MG tablet, Take 50 mg by mouth at bedtime as needed for anxiety (sleep). Reported on 12/21/2015, Disp: , Rfl:  .  NOVOLOG 100 UNIT/ML injection, ADMINISTER 200 UNITS VIA INSULIN PUMP EVERY 48 HOURS, Disp: 40 mL, Rfl: 5 .  tretinoin (RETIN-A) 0.025 % cream, Apply topically at bedtime. Apply sparingly dor acne. May start twice a week and then increase to daily in 1-2 weeks, Disp: 45 g, Rfl: 1 .  Urine Glucose-Ketones Test STRP, If urine output and extreme thirst increases, collect urine and check with strip as directed. Disp 1 vial, Disp: 20 each, Rfl: 3  Allergies as of 07/24/2017  . (No Known Allergies)     reports that  has never smoked. he has never used smokeless tobacco. He reports that he does not drink alcohol. Pediatric History  Patient  Guardian Status  . Mother:  [name]   Other Topics Concern  . Not on file  Social History Narrative   Lives with Mom and Sister (17y).  Mom quit smoking 18 years ago.  She has 2 other grown children.  They have a dog at home.  No recent travel.   1. 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 He has health insurance through [facility]. Mom has new health insurance as well. Mr. Zachery Dauer has had a prolonged bronchitis episode recently.   2. Activities: His play and video games.  3. Primary Care Provider: His new PCP is an NP named Herbert Seta, at the [facility].  4. Psychiatrist: None at present  REVIEW OF SYSTEMS: There are no other significant problems involving Daeshaun's other body systems.   Objective:  Vital Signs:  BP 108/72   Pulse 84   Ht 5\' 7"  (1.702 m)   Wt 154 lb 3.2 oz (69.9 kg)   BMI 24.15 kg/m    Ht Readings from Last 3 Encounters:  07/24/17 5\' 7"  (1.702 m) (18 %, Z= -0.91)*  05/07/17 5' 6.93" (1.7 m) (18 %, Z= -0.93)*   11/11/16 5\' 7"  (1.702 m) (19 %, Z= -0.88)*   * Growth percentiles are based on CDC (Boys, 2-20 Years) data.   Wt Readings from Last 3 Encounters:  07/24/17 154 lb 3.2 oz (69.9 kg) (51 %, Z= 0.02)*  05/07/17 153 lb 9.6 oz (69.7 kg) (51 %, Z= 0.02)*  03/04/17 152 lb 6.4 oz (69.1 kg) (50 %, Z= 0.00)*   * Growth percentiles are based on CDC (Boys, 2-20 Years) data.   HC Readings from Last 3 Encounters:  No data found for [facility]   Body surface area is 1.82 meters squared.  18 %ile (Z= -0.91) based on CDC (Boys, 2-20 Years) Stature-for-age data based on Stature recorded on 07/24/2017. 51 %ile (Z= 0.02) based on CDC (Boys, 2-20 Years) weight-for-age data using vitals from 07/24/2017. No head circumference on file for this encounter.   PHYSICAL EXAM:  Constitutional: The patient appears healthy and well nourished. His height has plateaued. He gained 3/4 pound since last visit. He again played with his video game during most of today's clinic visit. He engaged very well with me today. He was very friendly and polite. He continues to improve in terms of being more interactive with others. His speech was much better today. He talked a lot today. His affect was quite normal today, but his insight remains fairly limited. Head: The head is normocephalic. Face: His pustular acne has improved, but he has many small lesions. There are no obvious dysmorphic features. He has a grade 2-3 mustache and a goatee today.   Eyes: The eyes appear to be normally formed and spaced. Gaze is conjugate. There is no obvious arcus or proptosis. Moisture appears normal. Ears: The ears are normally placed and appear externally normal. Mouth: The oropharynx and tongue appear normal. Dentition appears to be normal for age. Oral moisture is normal. Neck: The neck appears to be visibly normal. No carotid bruits are noted. His strap muscles are large. The thyroid gland is again enlarged at about 21-22 grams in size. Both lobes are  symmetrically enlarged today. The consistency of the thyroid gland is relatively firm.  The thyroid gland is not tender to palpation. Lungs: The lungs are clear to auscultation. Air movement is good. Heart: Heart rate and rhythm are regular. Heart sounds  S1 and S2 are normal. I did not appreciate any pathologic cardiac murmurs. Abdomen: The abdomen is normal in size for the patient's age, but larger. Bowel sounds are normal. There is no obvious hepatomegaly, splenomegaly, or other mass effect.  Arms: Muscle size and bulk are normal for age. Hands: There is no obvious tremor. Phalangeal and metacarpophalangeal joints are normal. Palmar muscles are normal for age. Palmar skin is normal. Palmar moisture is also normal. Legs: Muscles appear normal for age. No edema is present. Feet: Feet are normally formed. Dorsalis pedal pulses are 1+ bilaterally.  I do not see any tinea pedis.  Neurologic: Strength is fairly normal for age in both the upper and lower extremities. Muscle tone is normal. Sensation to touch is normal in both the legs, but slightly decreased in the right heel and left ball.     LAB DATA: Results for orders placed or performed in visit on 07/24/17 (from the past 504 hour(s))  POCT Glucose (Device for Home Use)   Collection Time: 07/24/17  1:43 PM  Result Value Ref Range   Glucose Fasting, POC  70 - 99 mg/dL   POC Glucose 161 (A) 70 - 99 mg/dl     Labs 0/96/04: CBG 540  Labs 05/07/17: HbA1c 9.3%, CBG 246  Labs 03/04/17: CBG 494. He had not bolused after breakfast due to mom putting in a new pod just before they left home to drive to his appointment today.  Labs 12/26/16: HbA1c 10.2%, CBG 289  Labs 09/25/16: HbA1c 9.5%, CBG 261  Labs 06/27/16: CBG 114; TSH 1.54, free T4 1.1, free T3 3.4; CMP normal; urine microalbumin/creatinine ratio 2; cholesterol 108, triglycerides 153, HDL 43, LDL 34  Labs 04/10/16: HbA1c 9.5%, CBG 145  Labs 02/01/16: CBG 391, TSH 1.72  Labs 12/21/15: HbA1c  8.4%  Labs 10/10/15: HbA1c 8.2%.  Labs 08/09/15: HbA1c 8.5%  Labs 1012/16: HbA1c 8.8%; TSH 2.624, free T4 0.99, free T3 3.4; urine microalbumin/creatinine ratio <0.2; CMP normal  Labs 01/27/15: HbA1c 8.8%  Labs 10/25/14: Hemoglobin A1c 8.2%, compared with 7.9% at the last visit and with 8.4% at the prior visit.    Labs 10/22/13: CMP normal; C-peptide 0.65, increased from 0.19 at diagnosis (normal 0.80-3.90); TSH 1.265, free T4 1.01, free T3 3.4, TPO antibody 10.4    Assessment and Plan:   ASSESSMENT:  1. T1DM:   A. Since exiting the honeymoon period, Adric has required more and more exogenous insulin than he needed before. His BGs had been much more variable and had increased overall through his last visit.    B. In the past two months, however, his HbA1c and his BGs have been lower, in part due to the increases in basal rates that we instituted at his last visit. He is still having higher BGs during the night than I would like to see. Ms. Zachery Dauer is doing an excellent job of taking good care of Monish.     C. He appears to need a higher basal rate during the night.  2. Hypoglycemia: He has not had any documented low BGs this month, but mom thinks that he did have several since his last visit. He did have two SGs in the high 60s.  3-4. Goiter/thyroiditis:   A. He has the personal history of autoimmune T1DM. He also has the family history of autoimmune thyroid disease.  B. He continues to have waxing and waning of thyroid gland size. At his last visit the thyroid gland was smaller overall, but is  the same size today as it was at his last visit.    C. The pattern of waxing and waning of thyroid gland size and thyroid lobe sizes is c/w evolving Hashimoto's thyroiditis. It is almost a certainty that he will become hypothyroid in the future.   D.  He was euthyroid in April 2015 and in October 2016. His TSH was mid-range euthyroid in both July and December 2017. He needs follow up TFTs now.  5.  Adjustment reaction: Mom is feeling much better today. She is trying very hard to take good care of [place].  6. Autism/ADHD/ mental retardation: Tatem's autism continues to improve in that he is more social and outgoing. However, he remains completely dependent upon mom for his meals, shelter, clothing, recreational activities, physical activities, and T1DM care. 7. Dehydration: He looks well hydrated today. It appears that when Treyshaun's BGs are high he sometimes does not drink enough liquid to meet his needs, and so becomes dehydrated.  8. Peripheral neuropathy: His neuropathy is mild, but evident today.   PLAN:  1. Diagnostic: Continue BG checks as planned. TFTs, urine microalbumin/creatinine ratio, and CMP today 2. Therapeutic: Continue Small bedtime snack.  New basal rates: MN: 1.30 ->1.35 4 AM: 1.35 -> 1.40 8 AM: 1.10 New Noon: 0.90 6 PM: 1.15 3. Patient education: We discussed his elevated BGs, his hypoglycemia, his goiter, and all of the above at length. Mom feels good about the fact that his BGs have been better recently. I asked mom to call me if Jobanny has any BGs during the night or in the mornings that are <80 and unexplained. I also asked her to keep a log of unusual high BGs or lower BGs and the causes. 4. Follow-up: two months  Level of Service: This visit lasted in excess of 50 minutes. More than 50% of the visit was devoted to counseling.   [name], MD, CDE Adult and Pediatric Endocrinology

## 2017-07-24 NOTE — Patient Instructions (Signed)
Follow up visit in 2 months.  

## 2017-07-25 ENCOUNTER — Encounter (INDEPENDENT_AMBULATORY_CARE_PROVIDER_SITE_OTHER): Payer: Self-pay | Admitting: *Deleted

## 2017-07-25 LAB — COMPREHENSIVE METABOLIC PANEL
AG Ratio: 1.8 (calc) (ref 1.0–2.5)
ALKALINE PHOSPHATASE (APISO): 98 U/L (ref 48–230)
ALT: 22 U/L (ref 8–46)
AST: 18 U/L (ref 12–32)
Albumin: 4.5 g/dL (ref 3.6–5.1)
BILIRUBIN TOTAL: 1 mg/dL (ref 0.2–1.1)
BUN: 14 mg/dL (ref 7–20)
CALCIUM: 9.5 mg/dL (ref 8.9–10.4)
CO2: 32 mmol/L (ref 20–32)
Chloride: 98 mmol/L (ref 98–110)
Creat: 0.91 mg/dL (ref 0.60–1.26)
Globulin: 2.5 g/dL (calc) (ref 2.1–3.5)
Glucose, Bld: 262 mg/dL — ABNORMAL HIGH (ref 65–99)
Potassium: 4.3 mmol/L (ref 3.8–5.1)
Sodium: 137 mmol/L (ref 135–146)
Total Protein: 7 g/dL (ref 6.3–8.2)

## 2017-07-25 LAB — T4, FREE: Free T4: 1.1 ng/dL (ref 0.8–1.4)

## 2017-07-25 LAB — TSH: TSH: 2.18 m[IU]/L (ref 0.50–4.30)

## 2017-07-25 LAB — MICROALBUMIN / CREATININE URINE RATIO
CREATININE, URINE: 130 mg/dL (ref 20–320)
MICROALB UR: 0.3 mg/dL
Microalb Creat Ratio: 2 mcg/mg creat (ref <30)

## 2017-07-25 LAB — T3, FREE: T3, Free: 3.3 pg/mL (ref 3.0–4.7)

## 2017-09-06 ENCOUNTER — Telehealth (INDEPENDENT_AMBULATORY_CARE_PROVIDER_SITE_OTHER): Payer: Self-pay | Admitting: "Endocrinology

## 2017-09-06 NOTE — Telephone Encounter (Signed)
°  Who's calling (name and relationship to patient) : Johnny Pintoemetra [facility] Best contact number: [phone] Provider they see: Dr. Fransico MichaelBrennan Reason for call: [name] called to follow up on a fax she sent to the office. She wanted to confirm that we received it.

## 2017-09-09 NOTE — Telephone Encounter (Signed)
[place]ed [facility] to [facility], to advised that we have not received paperwork. They will re-fax it.

## 2017-09-13 ENCOUNTER — Ambulatory Visit (INDEPENDENT_AMBULATORY_CARE_PROVIDER_SITE_OTHER): Payer: Medicaid Other | Admitting: Nurse Practitioner

## 2017-09-13 ENCOUNTER — Encounter: Payer: Self-pay | Admitting: Nurse Practitioner

## 2017-09-13 VITALS — BP 117/59 | HR 86 | Resp 16 | Ht 67.0 in | Wt 155.8 lb

## 2017-09-13 DIAGNOSIS — IMO0002 Reserved for concepts with insufficient information to code with codable children: Secondary | ICD-10-CM

## 2017-09-13 DIAGNOSIS — L700 Acne vulgaris: Secondary | ICD-10-CM

## 2017-09-13 DIAGNOSIS — E108 Type 1 diabetes mellitus with unspecified complications: Secondary | ICD-10-CM | POA: Diagnosis not present

## 2017-09-13 DIAGNOSIS — E1065 Type 1 diabetes mellitus with hyperglycemia: Secondary | ICD-10-CM

## 2017-09-13 DIAGNOSIS — F840 Autistic disorder: Secondary | ICD-10-CM

## 2017-09-13 NOTE — Progress Notes (Signed)
[facility] [address] [place], [place] [address]  Internal MEDICINE  Office Visit Note  Patient Name: Johnny Munroendrew [name]  [birth year]  [id number]  Date of Service: 09/22/2017  Chief Complaint  Patient presents with  . Referral    for dermatology    The patient is here for routine follow up exam. Has long history of cystic acne on his face. To a lesser degree, he has this on back, shoulders, and chest. He is using topical treatment and has used on oral antibiotics. Helped for a little while, but no longer responding to these treatmens.     Pt is here for routine follow up.    Current Medication: Outpatient Encounter Medications as of 09/13/2017  Medication Sig  . ACCU-CHEK FASTCLIX LANCETS MISC CHECK BLOOD SUGAR 6 TIMES DAILY  . clindamycin-benzoyl peroxide (BENZACLIN) gel Apply 1 application topically at bedtime.  . diphenhydrAMINE (BENADRYL) 25 MG tablet Take 50 mg by mouth every 6 (six) hours as needed for itching or allergies.  Marland Kitchen. doxycycline (VIBRA-TABS) 100 MG tablet Take 100 mg by mouth daily. Continuous for acne  . GLUCAGEN HYPOKIT 1 MG SOLR injection INJECT 1 SYRINGE IN THE MUSCLE IF UNRESPONSIVE, UNABLE TO SWALLOW, UNCONSCIOUS AND/OR HAS SEIZURE FOR SEVERE HYPOGLYCEMIA  . hydrOXYzine (ATARAX/VISTARIL) 25 MG tablet Take 50 mg by mouth at bedtime as needed for anxiety (sleep). Reported on 12/21/2015  . NOVOLOG 100 UNIT/ML injection ADMINISTER 200 UNITS VIA INSULIN PUMP EVERY 48 HOURS  . tretinoin (RETIN-A) 0.025 % cream Apply topically at bedtime. Apply sparingly dor acne. May start twice a week and then increase to daily in 1-2 weeks  . Urine Glucose-Ketones Test STRP If urine output and extreme thirst increases, collect urine and check with strip as directed. Disp 1 vial   No facility-administered encounter medications on file as of 09/13/2017.     Surgical History: Past Surgical History:  Procedure Laterality Date  . DENTAL REHABILITATION      Medical  History: Past Medical History:  Diagnosis Date  . ADHD (attention deficit hyperactivity disorder)   . Allergic rhinitis   . Autism   . Diabetes mellitus without complication (HCC)     Family History: Family History  Problem Relation Age of Onset  . Hypothyroidism Mother   . Kidney disease Mother        Mom unsure what her kidney issues are  . Autism Sister   . Mental retardation Sister   . Cancer Maternal Grandfather   . Diabetes Paternal Grandmother        Died at age 20.  . Other Other        Paternal great aunt with "hypoglycemia" unsure why    Social History   Socioeconomic History  . Marital status: Single    Spouse name: Not on file  . Number of children: Not on file  . Years of education: Not on file  . Highest education level: Not on file  Social Needs  . Financial resource strain: Not on file  . Food insecurity - worry: Not on file  . Food insecurity - inability: Not on file  . Transportation needs - medical: Not on file  . Transportation needs - non-medical: Not on file  Occupational History  . Not on file  Tobacco Use  . Smoking status: Never Smoker  . Smokeless tobacco: Never Used  Substance and Sexual Activity  . Alcohol use: No  . Drug use: Not on file  . Sexual activity: Not on file  Other Topics Concern  . Not on file  Social History Narrative   Lives with Mom and Sister (17y).  Mom quit smoking 18 years ago.  She has 2 other grown children.  They have a dog at home.  No recent travel.      Review of Systems  Constitutional: Negative for chills, fatigue and unexpected weight change.  HENT: Positive for postnasal drip. Negative for congestion, rhinorrhea, sneezing and sore throat.   Eyes: Negative for redness.  Respiratory: Negative for cough, chest tightness and shortness of breath.   Cardiovascular: Negative for chest pain and palpitations.  Gastrointestinal: Negative for abdominal pain, constipation, diarrhea, nausea and vomiting.   Endocrine:       Patient on insulin pump due to type 1 diabetes. Followed by endocrinology.  Genitourinary: Negative for dysuria and frequency.  Musculoskeletal: Negative for arthralgias, back pain, joint swelling and neck pain.  Skin: Negative for rash.       Cystic acne, mostly on his face. Getting more severe and persistent despite treatment with topical acne treatments and oral antibiotics.   Neurological: Negative.  Negative for tremors and numbness.  Hematological: Negative for adenopathy. Does not bruise/bleed easily.  Psychiatric/Behavioral: Negative for behavioral problems (Depression), sleep disturbance and suicidal ideas. The patient is not nervous/anxious.     Today's Vitals   09/13/17 0927  BP: (!) 117/59  Pulse: 86  Resp: 16  SpO2: 95%  Weight: 155 lb 12.8 oz (70.7 kg)  Height: 5\' 7"  (1.702 m)    Physical Exam  Constitutional: He is oriented to person, place, and time. He appears well-developed and well-nourished. No distress.  HENT:  Head: Normocephalic and atraumatic.  Mouth/Throat: Oropharynx is clear and moist. No oropharyngeal exudate.  Eyes: EOM are normal. Pupils are equal, round, and reactive to light.  Neck: Normal range of motion. Neck supple. No JVD present. No tracheal deviation present. No thyromegaly present.  Cardiovascular: Normal rate, regular rhythm and normal heart sounds. Exam reveals no gallop and no friction rub.  No murmur heard. Pulmonary/Chest: Effort normal. No respiratory distress. He has no wheezes. He has no rales. He exhibits no tenderness.  Abdominal: Soft. Bowel sounds are normal.  Musculoskeletal: Normal range of motion.  Lymphadenopathy:    He has no cervical adenopathy.  Neurological: He is alert and oriented to person, place, and time. No cranial nerve deficit.  Patient is at his neurological baseline.   Skin: Skin is warm and dry. He is not diaphoretic.  Moderate cystic acne on the face.   Psychiatric: He has a normal mood  and affect. His behavior is normal. Judgment and thought content normal.  Nursing note and vitals reviewed.  Assessment/Plan:  1. Acne vulgaris - Ambulatory referral to Dermatology Continue to use topical treatments for acne as previously prescribed.   2. Autistic disorder, active Continue regular visits with neurology and psychiatry as scheduled.   3. Type I diabetes mellitus with complication, uncontrolled (HCC) Regular visits with endocrinology as scheduled.   General Counseling: Mozes verbalizes understanding of the findings of todays visit and agrees with plan of treatment. I have discussed any further diagnostic evaluation that may be needed or ordered today. We also reviewed his medications today. he has been encouraged to call the office with any questions or concerns that should arise related to todays visit.   This patient was seen by Vincent Gros, FNP- C in Collaboration with Dr Lyndon Code as a part of collaborative care agreement    Orders  Placed This Encounter  Procedures  . Ambulatory referral to Dermatology      Time spent: 45 Minutes     Dr Lyndon Code Internal medicine

## 2017-09-22 DIAGNOSIS — L700 Acne vulgaris: Secondary | ICD-10-CM | POA: Insufficient documentation

## 2017-09-26 ENCOUNTER — Ambulatory Visit (INDEPENDENT_AMBULATORY_CARE_PROVIDER_SITE_OTHER): Payer: Medicaid Other | Admitting: "Endocrinology

## 2017-09-30 ENCOUNTER — Encounter (INDEPENDENT_AMBULATORY_CARE_PROVIDER_SITE_OTHER): Payer: Self-pay | Admitting: "Endocrinology

## 2017-09-30 ENCOUNTER — Ambulatory Visit (INDEPENDENT_AMBULATORY_CARE_PROVIDER_SITE_OTHER): Payer: Medicaid Other | Admitting: "Endocrinology

## 2017-09-30 VITALS — BP 116/74 | HR 68 | Ht 67.13 in | Wt 155.6 lb

## 2017-09-30 DIAGNOSIS — E063 Autoimmune thyroiditis: Secondary | ICD-10-CM

## 2017-09-30 DIAGNOSIS — E1065 Type 1 diabetes mellitus with hyperglycemia: Secondary | ICD-10-CM | POA: Diagnosis not present

## 2017-09-30 DIAGNOSIS — E1042 Type 1 diabetes mellitus with diabetic polyneuropathy: Secondary | ICD-10-CM

## 2017-09-30 DIAGNOSIS — E049 Nontoxic goiter, unspecified: Secondary | ICD-10-CM

## 2017-09-30 DIAGNOSIS — E10649 Type 1 diabetes mellitus with hypoglycemia without coma: Secondary | ICD-10-CM | POA: Diagnosis not present

## 2017-09-30 DIAGNOSIS — E108 Type 1 diabetes mellitus with unspecified complications: Secondary | ICD-10-CM | POA: Diagnosis not present

## 2017-09-30 DIAGNOSIS — F840 Autistic disorder: Secondary | ICD-10-CM

## 2017-09-30 DIAGNOSIS — IMO0002 Reserved for concepts with insufficient information to code with codable children: Secondary | ICD-10-CM

## 2017-09-30 LAB — POCT GLUCOSE (DEVICE FOR HOME USE): POC GLUCOSE: 253 mg/dL — AB (ref 70–99)

## 2017-09-30 LAB — POCT GLYCOSYLATED HEMOGLOBIN (HGB A1C): Hemoglobin A1C: 8.7

## 2017-09-30 NOTE — Progress Notes (Signed)
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  HISTORY OF PRESENT ILLNESS:   Johnny [name] is a 22 y.o. Caucasian young man.   Johnny [name] was accompanied by his mother.  1. Johnny [name] was seen at [facility] in [place], [place], on 06/26/13 for complaints of weight loss, fatigue, personality change, polyuria, polydipsia, and vomiting. New-onset DM and DKA were diagnosed. He received fluids at the ED at [facility], was transferred to [facility], and was admitted to the PICU for evaluation and management of new-onset T1DM, DKA, dehydration, and ketonuria in the setting of pre-existing autism, ADHD, and possible mental retardation. Significantly abnormal lab results included: Venous pH 7.257, serum potassium 3.0, serum CO2 15, serum glucose 257, hemoglobin A1c 13.7%, C-peptide 0.19 (normal 0.80-3.09), anti-glutamic acid decarboxylase (GAD) antibody > 30 (normal < 1.0), anti-islet cell antibody 80 (normal < 5). TSH was 2.163, free T4 0.82, free T3 1.8 (normal 2.3-4.2). He was treated initially with infusions of insulin and fluids. When his DKA resolved, he was converted to a multiple daily injection (MDI) insulin plan, using Lantus as a basal insulin and Novolog aspart as a bolus insulin at mealtimes, bedtime, and 2 AM if needed. 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   2. Johnny [name] was discharged from [facility] on 07/04/13. He and his mother participated in our DSSP education program with Ms. Gearldine Bienenstock, RN. 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 When he went into the honeymoon period his Lantus dose was decreased to 6 units at night.  Since then, however, he has exited the honeymoon period and has had a gradual but progressive increase in his exogenous insulin requirement. He started his Dexcom G5 CGM in early 2016. He was converted to an Omnipod pump on 01/16/16. He was subsequently converted to a Dexcom G6.  3. Johnny [name]'s last [facility] visit was on 07/24/17. At that visit I again increased his basal rates at midnight and 4 AM.    A. In the interim he has been healthy. His BGs have been better.    B. Mom brought him to a dermatologist, who prescribed Claravis, an isoretinoin derivative.   C. His allergies/nasal sensitivities have been active with every weather change. He has not been bothered by headaches.    D. His Omnipod pump has been working well. His Dexcom G6 is working well. His SGs and BGs correlate better.   Johnny [name] has become increasingly more interactive with others. His speech has improved very much. Unfortunately, however, he still remains completely dependent on his mother for all of his basic needs.   F. When he plays hard his BGs can drop rapidly.   4. [name]:  Constitutional: Johnny [name] feels "1 thumb up".   Eyes: Vision seems to be good for distance vision when he wears his new glasses. He is wearing his glasses more often now. He had his last eye appointment in about January 2018. The eye doctor did not indicate any other problems with his eyes.  Neck: Johnny [name] has not been touching his neck or complaining of neck pains. There are no other recognized problems of the anterior neck.  Heart: There are no recognized heart problems. The ability  to perform physical activities seems normal for his limited  level of physical activity.  Gastrointestinal: He is still very hungry. Bowel movents seem normal. There are no recognized GI problems. Legs: Muscle mass and strength seem normal. He can perform his usual ADLs and other physical activities without obvious discomfort. He has no numbness, tingling, burning,  or pains. No edema is noted.  Feet: There are no obvious foot problems. No edema is noted.  Neurologic: There are no recognized problems with muscle movement and strength, sensation, or coordination. Hypoglycemia: Mom says that he has had some low BGs when he is active.       5. BG meter printout: He checks his BGs 4-8 times daily. Average BG was 226, compared with 174 at his last visit and with 180 at his prior visit. BG range was 56-414, compared with 73-430 at his last visit and with 70-High at his prior visit. His highest BGs occur at lunchtime and in the late evening. He had 8 BGs <80, the lowest being 54 and 68 on 09/05/17 and 60 on 09/09/17. His lowest  BGs occur in the afternoon, evening around dinner, and in the morning.   6. Dexcom printout: The new sensor only has data for the past two weeks. His average SG was 226, compared with 180 at his past visit. His SGs were often higher during the night and again at lunch. Mom says that he often eats late at night, but sometimes does not have insulin then. Or, mom sometimes gives him extra snack at night depending upon his BG at bedtime. He also sometimes eats without taking insulin in the mornings, but mom has tried hard to stop this problem.   PAST MEDICAL, FAMILY, AND SOCIAL HISTORY    Past Medical History:  Diagnosis Date  . ADHD (attention deficit hyperactivity disorder)   . Allergic rhinitis   . Autism   . Diabetes mellitus without complication (HCC)     Family History  Problem Relation Age of Onset  . Hypothyroidism Mother   . Kidney disease Mother        Mom unsure what her kidney issues are  . Autism Sister   . Mental retardation Sister   . Cancer Maternal Grandfather   . Diabetes Paternal Grandmother        Died at age 48.  . Other Other        Paternal great aunt with "hypoglycemia" unsure why     Current Outpatient Medications:  .  diphenhydrAMINE (BENADRYL) 25 MG tablet, Take 50 mg by mouth every 6 (six) hours as  needed for itching or allergies., Disp: , Rfl:  .  GLUCAGEN HYPOKIT 1 MG SOLR injection, INJECT 1 SYRINGE IN THE MUSCLE IF UNRESPONSIVE, UNABLE TO SWALLOW, UNCONSCIOUS AND/OR HAS SEIZURE FOR SEVERE HYPOGLYCEMIA, Disp: 2 mg, Rfl: 0 .  hydrOXYzine (ATARAX/VISTARIL) 25 MG tablet, Take 50 mg by mouth at bedtime as needed for anxiety (sleep). Reported on 12/21/2015, Disp: , Rfl:  .  NOVOLOG 100 UNIT/ML injection, ADMINISTER 200 UNITS VIA INSULIN PUMP EVERY 48 HOURS, Disp: 40 mL, Rfl: 5 .  Urine Glucose-Ketones Test STRP, If urine output and extreme thirst increases, collect urine and check with strip as directed. Disp 1 vial, Disp: 20 each, Rfl: 3 .  ACCU-CHEK FASTCLIX LANCETS MISC, CHECK BLOOD SUGAR 6 TIMES DAILY, Disp: 204 each, Rfl: 6 .  clindamycin-benzoyl peroxide (BENZACLIN) gel, Apply 1 application topically at bedtime., Disp: , Rfl:  .  doxycycline (VIBRA-TABS) 100 MG tablet,  Take 100 mg by mouth daily. Continuous for acne, Disp: , Rfl:  .  tretinoin (RETIN-A) 0.025 % cream, Apply topically at bedtime. Apply sparingly dor acne. May start twice a week and then increase to daily in 1-2 weeks (Patient not taking: Reported on 09/30/2017), Disp: 45 g, Rfl: 1  Allergies as of 09/30/2017  . (No Known Allergies)     reports that  has never smoked. he has never used smokeless tobacco. He reports that he does not drink alcohol. Pediatric History  Patient Guardian Status  . Mother:  [name]   Other Topics Concern  . Not on file  Social History Narrative   Lives with Mom and Sister (17y).  Mom quit smoking 18 years ago.  She has 2 other grown children.  They have a dog at home.  No recent travel.   1. 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 He has health insurance through [facility]. Mom has new health insurance as well.  2. Activities: His play and video games.  3. Primary Care Provider: His new PCP is an NP named Herbert Seta, at the [facility].  4. Psychiatrist: None at present  REVIEW OF SYSTEMS: There are no other significant problems involving Izaiah's other body systems.   Objective:  Vital Signs:  BP 116/74 (BP Location: Right Arm, Patient Position: Sitting, Cuff Size: Large)   Pulse 68   Ht 5' 7.13" (1.705 m)   Wt 155 lb 9.6 oz (70.6 kg)   BMI 24.28 kg/m    Ht Readings from Last 3 Encounters:  09/30/17 5' 7.13" (1.705 m) (19 %, Z= -0.87)*  09/13/17 5\' 7"  (1.702 m) (18 %, Z= -0.92)*  07/24/17 5\' 7"  (1.702 m) (18 %, Z= -0.91)*   * Growth percentiles are based on CDC (Boys, 2-20 Years) data.   Wt Readings from Last 3 Encounters:  09/30/17 155 lb 9.6 oz (70.6 kg) (52 %, Z= 0.05)*  09/13/17 155 lb 12.8 oz (70.7 kg) (53 %, Z= 0.06)*  07/24/17 154 lb 3.2 oz (69.9 kg) (51 %, Z= 0.02)*   * Growth percentiles are based on CDC (Boys, 2-20 Years) data.   HC Readings from Last 3 Encounters:  No data found for [facility]   Body surface area is 1.83 meters squared.  19 %ile (Z= -0.87) based on CDC (Boys, 2-20 Years) Stature-for-age data based on Stature recorded on 09/30/2017. 52 %ile (Z= 0.05) based on CDC (Boys, 2-20 Years) weight-for-age data using vitals from 09/30/2017. No head circumference on file for this encounter.   PHYSICAL EXAM:  Constitutional: The patient appears healthy and well nourished. His height has plateaued. He gained 1-1/2 pounds since last visit. He again played with his video game during most of today's clinic visit. He engaged very well with me today. He was very friendly and polite. He continues to improve in terms of being more interactive with others. His speech was fairly good today. He talked some today. His affect was quite normal today, but his insight remains fairly limited. Head: The head is normocephalic. Face: His pustular acne has improved, but he has many small lesions. There are no obvious dysmorphic features. He has a grade 2-3 mustache and a goatee today.   Eyes: The eyes  appear to be normally formed and spaced. Gaze is conjugate. There is no obvious arcus or proptosis. Moisture appears normal. Ears: The ears are normally placed and appear externally normal. Mouth: The oropharynx  and tongue appear normal. Dentition appears to be normal for age. Oral moisture is normal. Neck: The neck appears to be visibly normal. No carotid bruits are noted. His strap muscles are large. The thyroid gland is again enlarged at about 21-22 grams in size. Today the right lobe is smaller and the left larger. The consistency of the thyroid gland is relatively firm.  The thyroid gland is not tender to palpation. Lungs: The lungs are clear to auscultation. Air movement is good. Heart: Heart rate and rhythm are regular. Heart sounds S1 and S2 are normal. I did not appreciate any pathologic cardiac murmurs. Abdomen: The abdomen is normal in size for the patient's age, but larger. Bowel sounds are normal. There is no obvious hepatomegaly, splenomegaly, or other mass effect.  Arms: Muscle size and bulk are normal for age. Hands: There is no obvious tremor. Phalangeal and metacarpophalangeal joints are normal. Palmar muscles are normal for age. Palmar skin is normal. Palmar moisture is also normal. Legs: Muscles appear normal for age. No edema is present. Feet: Feet are normally formed. Dorsalis pedal pulses are 1+ bilaterally.  I do not see any tinea pedis.  Neurologic: Strength is fairly normal for age in both the upper and lower extremities. Muscle tone is normal. Sensation to touch is normal in both legs and both feet.      LAB DATA: Results for orders placed or performed in visit on 09/30/17 (from the past 504 hour(s))  POCT Glucose (Device for Home Use)   Collection Time: 09/30/17  1:04 PM  Result Value Ref Range   Glucose Fasting, POC  70 - 99 mg/dL   POC Glucose 409 (A) 70 - 99 mg/dl  POCT HgB W1X   Collection Time: 09/30/17  1:08 PM  Result Value Ref Range   Hemoglobin A1C 8.7      Labs 09/30/17: HbA1c 8.7%, CBG 254  Labs 07/24/17: CBG 263; TSH 2.18,, free T4 1.1, free T3 3.3; urine microalbumin/creatinine ration 2; CMP normal except glucose 263  Labs 05/07/17: HbA1c 9.3%, CBG 246  Labs 03/04/17: CBG 494. He had not bolused after breakfast due to mom putting in a new pod just before they left home to drive to his appointment today.  Labs 12/26/16: HbA1c 10.2%, CBG 289  Labs 09/25/16: HbA1c 9.5%, CBG 261  Labs 06/27/16: CBG 114; TSH 1.54, free T4 1.1, free T3 3.4; CMP normal; urine microalbumin/creatinine ratio 2; cholesterol 108, triglycerides 153, HDL 43, LDL 34  Labs 04/10/16: HbA1c 9.5%, CBG 145  Labs 02/01/16: CBG 391, TSH 1.72  Labs 12/21/15: HbA1c 8.4%  Labs 10/10/15: HbA1c 8.2%.  Labs 08/09/15: HbA1c 8.5%  Labs 1012/16: HbA1c 8.8%; TSH 2.624, free T4 0.99, free T3 3.4; urine microalbumin/creatinine ratio <0.2; CMP normal  Labs 01/27/15: HbA1c 8.8%  Labs 10/25/14: Hemoglobin A1c 8.2%, compared with 7.9% at the last visit and with 8.4% at the prior visit.    Labs 10/22/13: CMP normal; C-peptide 0.65, increased from 0.19 at diagnosis (normal 0.80-3.90); TSH 1.265, free T4 1.01, free T3 3.4, TPO antibody 10.4    Assessment and Plan:   ASSESSMENT:  1. T1DM:   A. Since exiting the honeymoon period, Johnny [name] has required more and more exogenous insulin than he needed before. His BGs had been much more variable and had increased overall through his last visit.   B. In the past month his BGs and SGs have been higher, but his HbA1c is lower. The most likely reason for this discrepancy is that he  probably had lower BGs and SGs 2-3 months ago. He is still having higher BGs during the night and even higher BGs at lunch than I would like to see. He appears to need a higher ICR at breakfast.   C. Ms. Zachery DauerBarnes is doing an excellent job of taking good care of Johnny [name].    2. Hypoglycemia: He has had 4 BGs <70 and another 4 BGs from 70-79. This amount of hypoglycemia is too high  for a person who has trouble identifying and responding to hypoglycemia, especially during the night.  3-4. Goiter/thyroiditis:   A. He has the personal history of autoimmune T1DM. He also has the family history of autoimmune thyroid disease.  B. He continues to have waxing and waning of thyroid gland size. At his last visit the thyroid gland was smaller overall, but is the same size today as it was at his last visit.    C. The pattern of waxing and waning of thyroid gland size and thyroid lobe sizes is c/w evolving Hashimoto's thyroiditis. It is almost a certainty that he will become hypothyroid in the future.   D.  He was euthyroid in April 2015, in October 2016, and in January 2019. Marland Kitchen. His TSH was mid-range euthyroid in both July and December 2017. He needs follow up TFTs now.  5. Adjustment reaction: Mom is feeling much better today. She is trying very hard to take good care of Johnny [name].  6. Autism/ADHD/ mental retardation: Cadarius's autism continues to improve in that he is more social and outgoing. However, he remains completely dependent upon mom for his meals, shelter, clothing, recreational activities, physical activities, and T1DM care. 7. Dehydration: He looks well hydrated today. It appears that when Shirley's BGs are high he sometimes does not drink enough liquid to meet his needs, and so becomes dehydrated.  8. Peripheral neuropathy: His neuropathy is not evident today, paralleling his decrease in HbA1c.   PLAN:  1. Diagnostic: Continue BG checks as planned. TFTs, urine microalbumin/creatinine ratio, and CMP today  2. Therapeutic: Continue Small bedtime snack.  Continue basal rates: MN: 1.35 4 AM: 1.40 8 AM: 1.10 Noon: 0.90 6 PM: 1.15 Continue ISF: 50 Continue BG targets: MN: 180 6 AM: 150 9 PM: 180 New ICRs: MN: 15: 6 AM:15 ->12 Noon: 15  3. Patient education: We discussed his elevated BGs, his hypoglycemia, his goiter, and all of the above at length. Mom feels good about  the fact that his HbA1c is lower. I asked mom to call me if Johnny [name] has any BGs during the night or in the mornings that are <80 and unexplained. I also asked her to keep a log of unusual high BGs or lower BGs and the causes.  4. Follow-up: two months  Level of Service: This visit lasted in excess of 60 minutes. More than 50% of the visit was devoted to counseling.   Molli KnockMichael Brennan, MD, CDE Adult and Pediatric Endocrinology

## 2017-09-30 NOTE — Patient Instructions (Signed)
Follow up visit in 2 months.  

## 2017-10-01 ENCOUNTER — Encounter (INDEPENDENT_AMBULATORY_CARE_PROVIDER_SITE_OTHER): Payer: Self-pay | Admitting: *Deleted

## 2017-10-15 ENCOUNTER — Encounter: Payer: Self-pay | Admitting: Internal Medicine

## 2017-10-15 ENCOUNTER — Ambulatory Visit: Payer: Medicaid Other | Admitting: Internal Medicine

## 2017-10-15 ENCOUNTER — Ambulatory Visit
Admission: RE | Admit: 2017-10-15 | Discharge: 2017-10-15 | Disposition: A | Discharge status: Home / Self Care | Payer: Medicaid Other | Source: Ambulatory Visit | Attending: Internal Medicine | Admitting: Internal Medicine

## 2017-10-15 VITALS — BP 102/68 | HR 86 | Temp 98.7°F | Resp 16 | Ht 67.0 in | Wt 152.2 lb

## 2017-10-15 DIAGNOSIS — E1065 Type 1 diabetes mellitus with hyperglycemia: Secondary | ICD-10-CM | POA: Diagnosis not present

## 2017-10-15 DIAGNOSIS — R05 Cough: Secondary | ICD-10-CM

## 2017-10-15 DIAGNOSIS — J029 Acute pharyngitis, unspecified: Secondary | ICD-10-CM | POA: Diagnosis not present

## 2017-10-15 DIAGNOSIS — R059 Cough, unspecified: Secondary | ICD-10-CM

## 2017-10-15 LAB — POCT RAPID STREP A (OFFICE): RAPID STREP A SCREEN: NEGATIVE

## 2017-10-15 NOTE — Progress Notes (Addendum)
[facility] [address] [place], [place] [address]  Internal MEDICINE  Office Visit Note  Patient Name: Johnny [name]  [id number]  [id number]  Date of Service: 10/31/2017  Chief Complaint  Patient presents with  . Sore Throat    since last monday , whites spots   . Cough  . Fever    for two days     HPI Pt is here for a sick visit. He was started o  Oral Retin- A for cystic acne. Pt is having sore throat and fever. He is also diabetic with insulin pump. Pt has severe autism and hard to communicate, mom is concerned. OTC meds did improve his sore throat. He has Type 1 DM with insulin pump and follow up with endocrinology .  Current Medication:  Outpatient Encounter Medications as of 10/15/2017  Medication Sig  . ACCU-CHEK FASTCLIX LANCETS MISC CHECK BLOOD SUGAR 6 TIMES DAILY  . CLARAVIS 40 MG capsule TK ONE C PO D  . NOVOLOG 100 UNIT/ML injection ADMINISTER 200 UNITS VIA INSULIN PUMP EVERY 48 HOURS  . clindamycin-benzoyl peroxide (BENZACLIN) gel Apply 1 application topically at bedtime.  . diphenhydrAMINE (BENADRYL) 25 MG tablet Take 50 mg by mouth every 6 (six) hours as needed for itching or allergies.  Marland Kitchen doxycycline (VIBRA-TABS) 100 MG tablet Take 100 mg by mouth daily. Continuous for acne  . GLUCAGEN HYPOKIT 1 MG SOLR injection INJECT 1 SYRINGE IN THE MUSCLE IF UNRESPONSIVE, UNABLE TO SWALLOW, UNCONSCIOUS AND/OR HAS SEIZURE FOR SEVERE HYPOGLYCEMIA (Patient not taking: Reported on 10/15/2017)  . hydrOXYzine (ATARAX/VISTARIL) 25 MG tablet Take 50 mg by mouth at bedtime as needed for anxiety (sleep). Reported on 12/21/2015  . tretinoin (RETIN-A) 0.025 % cream Apply topically at bedtime. Apply sparingly dor acne. May start twice a week and then increase to daily in 1-2 weeks (Patient not taking: Reported on 09/30/2017)  . Urine Glucose-Ketones Test STRP If urine output and extreme thirst increases, collect urine and check with strip as directed. Disp 1 vial   No  facility-administered encounter medications on file as of 10/15/2017.    Medical History: Past Medical History:  Diagnosis Date  . ADHD (attention deficit hyperactivity disorder)   . Allergic rhinitis   . Autism   . Diabetes mellitus without complication (HCC)      Vital Signs: BP 102/68 (BP Location: Right Arm, Patient Position: Sitting, Cuff Size: Small)   Pulse 86   Temp 98.7 F (37.1 C)   Resp 16   Ht 5\' 7"  (1.702 m)   Wt 152 lb 3.2 oz (69 kg)   SpO2 97%   BMI 23.84 kg/m    Review of Systems  Constitutional: Negative for chills, fatigue and unexpected weight change.  HENT: Positive for postnasal drip and sore throat. Negative for congestion, rhinorrhea and sneezing.   Eyes: Negative for redness.  Respiratory: Positive for cough. Negative for chest tightness and shortness of breath.   Cardiovascular: Negative for chest pain and palpitations.  Gastrointestinal: Negative for abdominal pain, constipation, diarrhea, nausea and vomiting.  Genitourinary: Negative for dysuria and frequency.  Musculoskeletal: Negative for arthralgias, back pain, joint swelling and neck pain.  Skin: Negative for rash.  Neurological: Negative.  Negative for tremors and numbness.  Hematological: Negative for adenopathy. Does not bruise/bleed easily.  Psychiatric/Behavioral: Negative for behavioral problems (Depression), sleep disturbance and suicidal ideas. The patient is not nervous/anxious.     Physical Exam  Constitutional: He is oriented to person, place, and time. He appears well-developed  and well-nourished. No distress.  HENT:  Head: Normocephalic and atraumatic.  Mouth/Throat: Oropharynx is clear and moist. No oropharyngeal exudate.  tonsils are enlarged with no erythema  Eyes: Pupils are equal, round, and reactive to light. EOM are normal.  Neck: Normal range of motion. Neck supple. No JVD present. No tracheal deviation present. No thyromegaly present.  Cardiovascular: Normal rate,  regular rhythm and normal heart sounds. Exam reveals no gallop and no friction rub.  No murmur heard. Pulmonary/Chest: Effort normal. No respiratory distress. He has no wheezes. He has no rales. He exhibits no tenderness.  Abdominal: Soft. Bowel sounds are normal.  Musculoskeletal: Normal range of motion.  Lymphadenopathy:    He has no cervical adenopathy.  Neurological: He is alert and oriented to person, place, and time. No cranial nerve deficit.  Skin: Skin is warm and dry. He is not diaphoretic.  Psychiatric: He has a normal mood and affect. His behavior is normal. Judgment and thought content normal.    Assessment/Plan: 1. Sore throat - POCT rapid strep A negative   2. Cough - DG Chest 2 View; Future, seems to be viral in origin, will await to prescribe abx   3. Uncontrolled type 1 diabetes mellitus with hyperglycemia [facility](HCC) - Per Endocrinology - Ambulatory referral to Ophthalmology - Ambulatory referral to Podiatry   General Counseling: Johnny [name] verbalizes understanding of the findings of todays visit and agrees with plan of treatment. I have discussed any further diagnostic evaluation that may be needed or ordered today. We also reviewed his medications today. he has been encouraged to call the office with any questions or concerns that should arise related to todays visit.   Orders Placed This Encounter  Procedures  . DG Chest 2 View  . Ambulatory referral to Ophthalmology  . Ambulatory referral to Podiatry  . POCT rapid strep A    Time spent:20 Minutes

## 2017-10-18 ENCOUNTER — Telehealth: Payer: Self-pay

## 2017-10-18 NOTE — Telephone Encounter (Signed)
PT ADVISED FOR XRAYS RESULT IS NORMAL AND HE DON'T HAVE NO FEVER AS PER DFK HE DON'T NEED ANTIBIOTIC SHE CAN CONSULT WITH DERMALOGY ABOUT HIS MED

## 2017-10-22 ENCOUNTER — Telehealth: Payer: Self-pay

## 2017-10-22 NOTE — Telephone Encounter (Signed)
Sent message to heather 

## 2017-10-23 NOTE — Telephone Encounter (Signed)
Everything I have in visit summary and patient summary shows type 1

## 2017-11-04 ENCOUNTER — Telehealth (INDEPENDENT_AMBULATORY_CARE_PROVIDER_SITE_OTHER): Payer: Self-pay | Admitting: "Endocrinology

## 2017-11-04 NOTE — Telephone Encounter (Signed)
1. Mother had me paged. 2. Mom is in a dilemma with Greig CastillaAndrew. He had a large dinner with over 291 grams of carbs, an unusually high amount. His BG before the meal was 150. Mom gave him his dinner insulin dose of 18.45 units at 9:14 PM. His BG at 10:30 PM was 190. He has another 11. 6 units on board. She gave him 48 grams of carbs of soda at 10:30 PM. Now, at 11 PM he has 8.45 units of insulin still on board. His current BG is 169.  3. Since his ICR is 1 unit for every 15 grams of carbs, he needs to take an additional 130 grams of carbs now to offset the 8.45 units of insulin on board.  Molli KnockMichael Brennan, MD, CDE

## 2017-11-05 ENCOUNTER — Telehealth (INDEPENDENT_AMBULATORY_CARE_PROVIDER_SITE_OTHER): Payer: Self-pay | Admitting: "Endocrinology

## 2017-11-05 NOTE — Telephone Encounter (Signed)
Who's calling (name and relationship to patient) : Mother/Elaine  Best contact number: [phone]  Provider they see: Dr Fransico MichaelBrennan  Reason for call: caller states she is calling her son is due more insulin at bedtime and believes this will throw his blood sugars really low, asking what to do.   Call ID: [phone]  Result/outcome- Doctor opened message, Team[employer] notification received

## 2017-11-12 ENCOUNTER — Telehealth (INDEPENDENT_AMBULATORY_CARE_PROVIDER_SITE_OTHER): Payer: Self-pay | Admitting: "Endocrinology

## 2017-11-12 NOTE — Telephone Encounter (Signed)
Returned TC to Edison Internationalmom Johnny [name]. She states that his Bg's have been low since Sunday.  Sunday Dinner 89 HS-68  Monday am 87  Lunch 234 Dinner 89  Tuesday am 65  Spoke with Dr Fransico MichaelBrennan and suggested to change his Basal settings as listed below and he agreed to change them: Time U/hr New 12 1.35 1.25 4a 1.40 1.30 8a 1.10 1.00 12p 0.90 0.80 6p 1.15 1.05 Total 27.70   25.30   Mom made the changes, advised that if he continues to be low to call me again tomorrow.  Johnny [name] is also requesting a letter of Medical Necessity for Diabetes supplies, which Medicaid is asking to continue to cover the insulin pump, and Dexcom sensors. She will send us a letter form to include all of the requested items.

## 2017-11-12 NOTE — Telephone Encounter (Signed)
Routed to providers

## 2017-11-12 NOTE — Telephone Encounter (Signed)
°  Who's calling (name and relationship to patient) : Consuella Loselaine (Mother) Best contact number: [phone] Provider they see: Dr. Fransico MichaelBrennan Reason for call: Mom stated that pt's blood sugar was 65 at 9:57am this morning. Pt ate 53 carbs and his current blood sugar is 258. Mom stated pt has not had any insulin today. Mom wants to know what she should do. Please advise.

## 2017-11-15 ENCOUNTER — Encounter (INDEPENDENT_AMBULATORY_CARE_PROVIDER_SITE_OTHER): Payer: Self-pay | Admitting: *Deleted

## 2017-11-18 ENCOUNTER — Telehealth (INDEPENDENT_AMBULATORY_CARE_PROVIDER_SITE_OTHER): Payer: Self-pay | Admitting: *Deleted

## 2017-11-18 ENCOUNTER — Encounter (INDEPENDENT_AMBULATORY_CARE_PROVIDER_SITE_OTHER): Payer: Self-pay | Admitting: *Deleted

## 2017-11-18 NOTE — Telephone Encounter (Signed)
LVM to advise that letter is ready to be picked up at the front desk. You can also see on Mychart.

## 2017-11-25 ENCOUNTER — Other Ambulatory Visit: Payer: Self-pay | Admitting: Pediatrics

## 2017-11-25 LAB — HM DIABETES EYE EXAM

## 2017-11-25 MED ORDER — GLUCAGON HCL (RDNA) 1 MG IJ SOLR: INTRAMUSCULAR | 5 refills | Status: DC

## 2017-11-25 NOTE — Telephone Encounter (Signed)
Sent MyChart message stating medication was refilled and sent to pharmacy.

## 2017-12-03 ENCOUNTER — Encounter (INDEPENDENT_AMBULATORY_CARE_PROVIDER_SITE_OTHER): Payer: Self-pay | Admitting: "Endocrinology

## 2017-12-03 ENCOUNTER — Ambulatory Visit (INDEPENDENT_AMBULATORY_CARE_PROVIDER_SITE_OTHER): Payer: Medicaid Other | Admitting: "Endocrinology

## 2017-12-03 VITALS — BP 94/58 | HR 82 | Ht 66.85 in | Wt 161.0 lb

## 2017-12-03 DIAGNOSIS — E063 Autoimmune thyroiditis: Secondary | ICD-10-CM

## 2017-12-03 DIAGNOSIS — E10649 Type 1 diabetes mellitus with hypoglycemia without coma: Secondary | ICD-10-CM

## 2017-12-03 DIAGNOSIS — E1065 Type 1 diabetes mellitus with hyperglycemia: Secondary | ICD-10-CM

## 2017-12-03 DIAGNOSIS — IMO0001 Reserved for inherently not codable concepts without codable children: Secondary | ICD-10-CM

## 2017-12-03 DIAGNOSIS — F4320 Adjustment disorder, unspecified: Secondary | ICD-10-CM

## 2017-12-03 DIAGNOSIS — F840 Autistic disorder: Secondary | ICD-10-CM

## 2017-12-03 DIAGNOSIS — E049 Nontoxic goiter, unspecified: Secondary | ICD-10-CM

## 2017-12-03 LAB — POCT GLUCOSE (DEVICE FOR HOME USE): POC GLUCOSE: 334 mg/dL — AB (ref 70–99)

## 2017-12-03 NOTE — Patient Instructions (Signed)
Follow up visit in 2 months.  

## 2017-12-03 NOTE — Progress Notes (Signed)
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  HISTORY OF PRESENT ILLNESS:   Johnny [name] is a 22 y.o. Caucasian young man.   Johnny [name] was accompanied by his mother.  1. Johnny [name] was seen at [facility] in [place], [place], on 06/26/13 for complaints of weight loss, fatigue, personality change, polyuria, polydipsia, and vomiting. New-onset DM and DKA were diagnosed. 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 Significantly abnormal lab results included: Venous pH 7.257, serum potassium 3.0, serum CO2 15, serum glucose 257, hemoglobin A1c 13.7%, C-peptide 0.19 (normal 0.80-3.09), anti-glutamic acid decarboxylase (GAD) antibody > 30 (normal < 1.0), anti-islet cell antibody 80 (normal < 5). TSH was 2.163, free T4 0.82, free T3 1.8 (normal 2.3-4.2). He was treated initially with infusions of insulin and fluids. When his DKA resolved, he was converted to a multiple daily injection (MDI) insulin plan, using Lantus as a basal insulin and Novolog aspart as a bolus insulin at mealtimes, bedtime, and 2 AM if needed. 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   2. Johnny [name] was discharged from [facility] on 07/04/13. He and his mother participated in our DSSP education program with Ms. Gearldine Bienenstock, RN. 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 When he went into the honeymoon period his Lantus dose was decreased to 6 units at night.  Since then, however, he has exited the honeymoon period and has had a gradual but progressive increase in his exogenous insulin requirement. He started his Dexcom G5 CGM in early 2016. He was converted to an Omnipod pump on 01/16/16. He was subsequently converted to a Dexcom G6.  3. Johnny [name]'s last [facility] visit was on 09/30/17. At that visit I again increased his ICR from 6 AM to noon. When Johnny [name] had more low BGs in April, we changed his basal rates on 11/12/17. Since then he has had more high BGs.   A. In the interim he has been healthy.   B. His pharmacist told mom that Claravis was not available and converted him to Amnesteem, both forms of isoretinoin.    C. His allergies/nasal sensitivities have not been quite as bad this year. Mom still gives him Benadryl when she thinks that he needs it.     D. His Omnipod pump has been working well. His Dexcom G6 is working well. His SGs and BGs correlate better.   Johnny [name] has become increasingly more interactive with others. His speech has improved, but varies from day to day. Unfortunately, however, he still remains completely dependent on his mother for all of his basic needs.   F. When he plays hard his BGs can drop rapidly.  G. His appetite for french fries, chocolate, and other carbs has increased. His BGs are also higher. Mom is "freaked out" by the large amounts of insulin that he takes now. I told her that these are not really large amounts of insulin for an adult, but she still doesn't want her son to need as much insulin as he needs  now. His appetite is larger and his favorite food now are chocolate and french fries.    4. [name]:  Constitutional: Johnny [name] feels "good - 2 thumbs up".   Eyes: Vision seems to be good when he wears his new glasses. He had his last eye appointment earlier this month, May 2019. The eye doctor did not see any signs of DM damage.   Neck: Johnny [name] has not been touching his neck or complaining of neck pains.  There are no other recognized problems of the anterior neck.  Heart: There are no recognized heart problems. The ability to perform physical activities seems normal for his limited  level of physical activity.  Gastrointestinal: He is still very hungry. Bowel movents seem normal. There are no recognized GI problems. Legs: Muscle mass and strength seem normal. He can perform his usual ADLs and other physical activities without obvious discomfort. He has no numbness, tingling, burning, or pains. No edema is noted.  Feet: The skin on his feet is dry. The dermatologist diagnosed psoriasis. He is now applying a prescription lotion. There are no other obvious foot problems. No edema is noted.  Neurologic: There are no recognized problems with muscle movement and strength, sensation, or coordination. Hypoglycemia: Mom says that he has had some low BGs when he is active.   5. BG meter printout: He checks his BGs 4-6 times daily. Average BG was 222, compared with 226 at his last visit and with 174 at his prior visit. BG range was 89-457, compared with 56-414 at his last visit and with 73-430 at his prior visit. His highest BGs occur during the night at 3 AM. His lowest BGs occurred in the evenings. He had no BGs <80.   6. Dexcom printout: The new sensor only has data for the past two weeks. His average SG was 247, compared with 226 at his last visit and with 180 at his prior visit. His SGs were often higher during the night and again in the evenings. The SG pattern from noon to midnight changes almost every day depending upon what he eats, how much he eats, and how active he is.   PAST MEDICAL, FAMILY, AND SOCIAL HISTORY    Past Medical History:  Diagnosis Date  . ADHD (attention deficit hyperactivity disorder)   . Allergic rhinitis   . Autism   . Diabetes mellitus without complication (HCC)     Family History  Problem Relation Age of Onset  . Hypothyroidism Mother   . Kidney disease Mother         Mom unsure what her kidney issues are  . Autism Sister   . Mental retardation Sister   . Cancer Maternal Grandfather   . Diabetes Paternal Grandmother        Died at age 35.  . Other Other        Paternal great aunt with "hypoglycemia" unsure why     Current Outpatient Medications:  .  ACCU-CHEK FASTCLIX LANCETS MISC, CHECK BLOOD SUGAR 6 TIMES DAILY, Disp: 204 each, Rfl: 6 .  CLARAVIS 40 MG capsule, TK ONE C PO D, Disp: , Rfl: 0 .  clindamycin-benzoyl peroxide (BENZACLIN) gel, Apply 1 application topically at bedtime., Disp: , Rfl:  .  diphenhydrAMINE (BENADRYL) 25 MG tablet, Take 50 mg by mouth every 6 (six) hours as needed for itching or allergies., Disp: , Rfl:  .  doxycycline (VIBRA-TABS) 100 MG tablet, Take 100 mg by mouth daily. Continuous for acne, Disp: ,  Rfl:  .  glucagon (GLUCAGEN HYPOKIT) 1 MG SOLR injection, Inject  IM for extreme hypoglycemia., Disp: 2 each, Rfl: 5 .  hydrOXYzine (ATARAX/VISTARIL) 25 MG tablet, Take 50 mg by mouth at bedtime as needed for anxiety (sleep). Reported on 12/21/2015, Disp: , Rfl:  .  NOVOLOG 100 UNIT/ML injection, ADMINISTER 200 UNITS VIA INSULIN PUMP EVERY 48 HOURS, Disp: 40 mL, Rfl: 5 .  tretinoin (RETIN-A) 0.025 % cream, Apply topically at bedtime. Apply sparingly dor acne. May start twice a week and then increase to daily in 1-2 weeks (Patient not taking: Reported on 09/30/2017), Disp: 45 g, Rfl: 1 .  Urine Glucose-Ketones Test STRP, If urine output and extreme thirst increases, collect urine and check with strip as directed. Disp 1 vial, Disp: 20 each, Rfl: 3  Allergies as of 12/03/2017  . (No Known Allergies)     reports that he has never smoked. He has never used smokeless tobacco. He reports that he does not drink alcohol. Pediatric History  Patient Guardian Status  . Mother:  [name]   Other Topics Concern  . Not on file  Social History Narrative   Lives with Mom and Sister (17y).  Mom quit smoking 18 years ago.  She has 2  other grown children.  They have a dog at home.  No recent travel.   1. 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 He has health insurance through [name] [place]. Mom has health insurance as well.  2. Activities: His play and video games.  3. Primary Care Provider: His new PCP is an NP named Johnny [name], at the [name].  4. Psychiatrist: None at present  REVIEW OF SYSTEMS: There are no other significant problems involving Johnny [name]'s other body systems.   Objective:  Vital Signs:  BP (!) 94/58   Pulse 82   Ht 5' 6.85" (1.698 m)   Wt 161 lb (73 kg)   BMI 25.33 kg/m    Ht Readings from Last 3 Encounters:  12/03/17 5' 6.85" (1.698 m) (16 %, Z= -0.98)*  10/15/17  (1.702 m) (18 %, Z= -0.92)*  09/30/17 5' 7.13" (1.705 m) (19 %, Z= -0.87)*   * Growth percentiles are based on CDC (Boys, 2-20 Years) data.   Wt Readings from Last 3 Encounters:  12/03/17 161 lb (73 kg) (59 %, Z= 0.24)*  10/15/17 152 lb 3.2 oz (69 kg) (46 %, Z= -0.10)*  09/30/17 155 lb 9.6 oz (70.6 kg) (52 %, Z= 0.05)*   * Growth percentiles are based on CDC (Boys, 2-20 Years) data.   HC Readings from Last 3 Encounters:  No data found for Johnny [name]   Body surface area is 1.86 meters squared.  16 %ile (Z= -0.98) based on CDC (Boys, 2-20 Years) Stature-for-age data based on Stature recorded on 12/03/2017. 59 %ile (Z= 0.24) based on CDC (Boys, 2-20 Years) weight-for-age data using vitals from 12/03/2017. No head circumference on file for this encounter.   PHYSICAL EXAM:  Constitutional: The patient appears healthy, well nourished, but tired. His height has plateaued at the 16%. He gained 6 pounds since last visit and has increased to the 59.30%. His BMI has increased to the 75.56%. He again played with his video game during most of today's clinic visit. He engaged pretty well with me today. When mom and I began to talk about his food and weight, he sat down on the floor  and looked down. He was otherwise  very friendly and polite. He talked some today. His affect was normal today, but his insight remains fairly limited. Head: The head is normocephalic. Face: His pustular acne has improved, but he has many small lesions. There are no obvious dysmorphic features. He has a grade 2-3 mustache and a goatee today.   Eyes: The eyes appear to be normally formed and spaced. Gaze is conjugate. There is no obvious arcus or proptosis. Moisture appears normal. Ears: The ears are normally placed and appear externally normal. Mouth: The oropharynx and tongue appear normal. Dentition appears to be normal for age. Oral moisture is normal. Neck: The neck appears to be visibly normal. No carotid bruits are noted. His strap muscles are large. The thyroid gland is again enlarged at about 21-22 grams in size. Today the right lobe is smaller and the left larger. The consistency of the thyroid gland is relatively firm.  The thyroid gland is not tender to palpation. Lungs: The lungs are clear to auscultation. Air movement is good. Heart: Heart rate and rhythm are regular. Heart sounds S1 and S2 are normal. I did not appreciate any pathologic cardiac murmurs. Abdomen: The abdomen is normal in size for the patient's age, but larger. Bowel sounds are normal. There is no obvious hepatomegaly, splenomegaly, or other mass effect.  Arms: Muscle size and bulk are normal for age. Hands: There is no obvious tremor. Phalangeal and metacarpophalangeal joints are normal. Palmar muscles are normal for age. Palmar skin is normal. Palmar moisture is also normal. Legs: Muscles appear normal for age. No edema is present. Feet: Feet are normally formed. Dorsalis pedal pulses are faint 1+ on the right and 1+ on the left.  I do not see any tinea pedis.  Neurologic: Strength is fairly normal for age in both the upper and lower extremities. Muscle tone is normal. Sensation to touch is normal in both legs and both  feet.      LAB DATA: No results found for this or any previous visit (from the past 504 hour(s)).   Labs 12/03/17: CBG 334  Labs 09/30/17: HbA1c 8.7%, CBG 254  Labs 07/24/17: CBG 263; TSH 2.18, free T4 1.1, free T3 3.3; urine microalbumin/creatinine ration 2; CMP normal except glucose 263  Labs 05/07/17: HbA1c 9.3%, CBG 246  Labs 03/04/17: CBG 494. He had not bolused after breakfast due to mom putting in a new pod just before they left home to drive to his appointment today.  Labs 12/26/16: HbA1c 10.2%, CBG 289  Labs 09/25/16: HbA1c 9.5%, CBG 261  Labs 06/27/16: CBG 114; TSH 1.54, free T4 1.1, free T3 3.4; CMP normal; urine microalbumin/creatinine ratio 2; cholesterol 108, triglycerides 153, HDL 43, LDL 34  Labs 04/10/16: HbA1c 9.5%, CBG 145  Labs 02/01/16: CBG 391, TSH 1.72  Labs 12/21/15: HbA1c 8.4%  Labs 10/10/15: HbA1c 8.2%.  Labs 08/09/15: HbA1c 8.5%  Labs 1012/16: HbA1c 8.8%; TSH 2.624, free T4 0.99, free T3 3.4; urine microalbumin/creatinine ratio <0.2; CMP normal  Labs 01/27/15: HbA1c 8.8%  Labs 10/25/14: Hemoglobin A1c 8.2%, compared with 7.9% at the last visit and with 8.4% at the prior visit.    Labs 10/22/13: CMP normal; C-peptide 0.65, increased from 0.19 at diagnosis (normal 0.80-3.90); TSH 1.265, free T4 1.01, free T3 3.4, TPO antibody 10.4    Assessment and Plan:   ASSESSMENT:  1. T1DM:   A. Since exiting the honeymoon period, Johnny [name] has required more and more exogenous insulin than he needed before. His BGs had been much more  variable and had increased overall through his last visit.   B. In the past month his BGs and SGs have been higher, c/w him eating more carbs, but not being physically active.   C. He needs a higher basal rate during the night. His carb intake also needs to be reduced.   D. Ms. Zachery Dauer is doing an excellent job of taking care of Johnny [name].    2. Hypoglycemia: He has had no BGs or SGs <80 since adjusting his insulin pump settings.   3-4.  Goiter/thyroiditis:   A. He has the personal history of autoimmune T1DM. He also has the family history of autoimmune thyroid disease.  B. He continues to have waxing and waning of thyroid gland size. At his last visit the thyroid gland was smaller overall, but is the same size today as it was at his last visit.    C. The pattern of waxing and waning of thyroid gland size and thyroid lobe sizes is c/w evolving Hashimoto's thyroiditis. It is almost a certainty that he will become hypothyroid in the future.   D.  He was euthyroid in April 2015, in October 2016, and in January 2019. His TSH was mid-range euthyroid in both July and December 2017, but somewhat higher in January 2019.   5. Adjustment reaction: Mom is feeling frustrated today by Johnny [name]'s increased appetite for carbs and his higher BGs. She is trying very hard to take good care of Johnny [name]. We discussed options for reducing his carb intake. 6. Autism/ADHD/ mental retardation: Johnny [name]'s autism has improved somewhat in that he is more social and outgoing. However, he remains completely dependent upon mom for his meals, shelter, clothing, recreational activities, physical activities, and T1DM care. 7. Dehydration: He looks well hydrated today. It appears that when Johnny [name]'s BGs are high he sometimes does not drink enough liquid to meet his needs, and so becomes dehydrated.  8. Peripheral neuropathy: His neuropathy is not evident today.   PLAN:  1. Diagnostic: Continue BG checks as planned. Call on Sunday, May 26th.  Call earlier if having problems.   2. Therapeutic: Continue Small bedtime snack.   New basal rates: MN: 1.25 -> 1.30 4 AM: 1.30 -> 1.35 8 AM: 1.00 -> 1.05 Noon: 0.800 06 PM: 1.050  Continue ISF: 50  Continue BG targets: MN: 180 6 AM: 150 9 PM: 180  Continue ICRs: MN: 15: 6 AM:12 Noon: 15  3. Patient education: We discussed his elevated BGs, his hypoglycemia, his goiter, and all of the above at length. Mom is  concerned that his BGs and insulin doses are higher.  I told her that the larger he becomes and the less insulin he produces on his own, the more insulin he needs. I asked mom to call me if Johnny [name] has any BGs during the night or in the mornings that are <80 and unexplained. I also asked her to keep a log of unusual high BGs or lower BGs and the causes.  4. Follow-up: two months  Level of Service: This visit lasted in excess of 60 minutes. More than 50% of the visit was devoted to counseling.   [name], MD, CDE Adult and Pediatric Endocrinology

## 2017-12-13 ENCOUNTER — Encounter (INDEPENDENT_AMBULATORY_CARE_PROVIDER_SITE_OTHER): Payer: Self-pay | Admitting: "Endocrinology

## 2017-12-13 NOTE — Telephone Encounter (Signed)
error 

## 2017-12-20 ENCOUNTER — Telehealth (INDEPENDENT_AMBULATORY_CARE_PROVIDER_SITE_OTHER): Payer: Self-pay | Admitting: "Endocrinology

## 2017-12-20 NOTE — Telephone Encounter (Signed)
°  Who's calling (name and relationship to patient) : Shanda BumpsJessica ([facility] [place]) Best contact number: [phone] Provider they see: Dr. Fransico MichaelBrennan Reason for call: Shanda BumpsJessica following up on fax regarding pt's rxs that Dr. Fransico MichaelBrennan would need to sign off on. She stated she sent them last Friday. She called to see if we have received them.

## 2017-12-20 NOTE — Telephone Encounter (Signed)
Left message for Johnny [name] that [facility] staff will call her back on Monday with update on prescriptions.

## 2017-12-23 NOTE — Telephone Encounter (Signed)
Spoke to GunnisonJessica advised we never received paperwork, she advises she will email it.

## 2018-01-29 ENCOUNTER — Other Ambulatory Visit (INDEPENDENT_AMBULATORY_CARE_PROVIDER_SITE_OTHER): Payer: Self-pay | Admitting: "Endocrinology

## 2018-01-29 DIAGNOSIS — E1065 Type 1 diabetes mellitus with hyperglycemia: Principal | ICD-10-CM

## 2018-01-29 DIAGNOSIS — IMO0001 Reserved for inherently not codable concepts without codable children: Secondary | ICD-10-CM

## 2018-02-05 ENCOUNTER — Encounter (INDEPENDENT_AMBULATORY_CARE_PROVIDER_SITE_OTHER): Payer: Self-pay | Admitting: "Endocrinology

## 2018-02-05 ENCOUNTER — Ambulatory Visit (INDEPENDENT_AMBULATORY_CARE_PROVIDER_SITE_OTHER): Payer: Medicaid Other | Admitting: "Endocrinology

## 2018-02-05 VITALS — BP 106/64 | HR 100 | Ht 66.73 in | Wt 153.6 lb

## 2018-02-05 DIAGNOSIS — E063 Autoimmune thyroiditis: Secondary | ICD-10-CM | POA: Diagnosis not present

## 2018-02-05 DIAGNOSIS — E049 Nontoxic goiter, unspecified: Secondary | ICD-10-CM | POA: Diagnosis not present

## 2018-02-05 DIAGNOSIS — E10649 Type 1 diabetes mellitus with hypoglycemia without coma: Secondary | ICD-10-CM

## 2018-02-05 DIAGNOSIS — E1065 Type 1 diabetes mellitus with hyperglycemia: Secondary | ICD-10-CM

## 2018-02-05 DIAGNOSIS — E1042 Type 1 diabetes mellitus with diabetic polyneuropathy: Secondary | ICD-10-CM

## 2018-02-05 DIAGNOSIS — F840 Autistic disorder: Secondary | ICD-10-CM

## 2018-02-05 DIAGNOSIS — IMO0001 Reserved for inherently not codable concepts without codable children: Secondary | ICD-10-CM

## 2018-02-05 DIAGNOSIS — F4320 Adjustment disorder, unspecified: Secondary | ICD-10-CM

## 2018-02-05 LAB — POCT GLUCOSE (DEVICE FOR HOME USE): POC Glucose: 251 mg/dl — AB (ref 70–99)

## 2018-02-05 NOTE — Progress Notes (Signed)
02/05/2018 *This diabetes plan serves as a healthcare provider order, transcribe onto school form.  The nurse will teach school staff procedures as needed for diabetic care in the school.Johnny [name]* Johnny [name]   DOB: December 24, 1995  School: _______________________________________________________________  Parent/Guardian: Johnny LoseElaine Barnes__________________________phone #: __[phone]_________________  Parent/Guardian: ___________________________phone #: _____________________  Diabetes Diagnosis: Type 1 Diabetes  ______________________________________________________________________ Blood Glucose Monitoring  Target range for blood glucose is: 80-180 Times to check blood glucose level: Before meals and As needed for signs/symptoms  Student has an CGM: Yes-Dexcom Patient may not use blood sugar reading from continuous glucose monitoring for correction.  Hypoglycemia Treatment (Low Blood Sugar) Johnny MunroeAndrew [name] usual symptoms of hypoglycemia:  shaky, fast heart beat, sweating, anxious, hungry, weakness/fatigue, headache, dizzy, blurry vision, irritable/grouchy.  Self treats mild hypoglycemia: No   If showing signs of hypoglycemia, OR blood glucose is less than 80 mg/dl, give a quick acting glucose product equal to 15 grams of carbohydrate. Recheck blood sugar in 15 minutes & repeat treatment if blood glucose is less than 80 mg/dl.   If Johnny [name] is hypoglycemic, unconscious, or unable to take glucose by mouth, or is having seizure activity, give 1 MG (1 CC) Glucagon intramuscular (IM) in the buttocks or thigh. Turn Johnny MunroeAndrew [name] on side to prevent choking. Call 911 & the student's parents/guardians. Reference medication authorization form for details.  Hyperglycemia Treatment (High Blood Sugar) Check urine ketones every 3 hours when blood glucose levels are 400 mg/dl or if vomiting. For blood glucose greater than 300 mg/dl AND at least 3 hours since last insulin dose, give correction dose of insulin.    Notify parents of blood glucose if over 300 mg/dl & moderate to large ketones.  Allow  unrestricted access to bathroom. Give extra water or non sugar containing drinks.  If Johnny [name] has symptoms of hyperglycemia emergency, call 911.  Symptoms of hyperglycemia emergency include:  high blood sugar & vomiting, severe abdominal pain, shortness of breath, chest pain, increased sleepiness & or decreased level of consciousness.  Physical Activity & Sports A quick acting source of carbohydrate such as glucose tabs or juice must be available at the site of physical education activities or sports. Johnny [name] is encouraged to participate in all exercise, sports and activities.  Do not withhold exercise for high blood glucose that has no, trace or small ketones. Johnny [name] may participate in sports, exercise if blood glucose is above 100. For blood glucose below 100 before exercise, give 15 grams carbohydrate snack without insulin. Johnny [name] should not exercise if their blood glucose is greater than 300 mg/dl with moderate to large ketones.   Diabetes Medication Plan  Student has an insulin pump:  Yes-Omnipod  When to give insulin Breakfast: 1 unit per 12 grams of carbs  and 1 unit per 50 point above 150 glucose Lunch: 1 unit per 15 grams of carbs  and 1 unit per 50 point above 150 glucose Snack: 1 unit per 15 grams of carbs , 1 unit per 50 point above 150 glucose and see plan  Student's Self Care for Glucose Monitoring: Needs supervision  Student's Self Care Insulin Administration Skills: Needs supervision  Parents/Guardians Authorization to Adjust Insulin Dose Yes:  Parents/guardians are authorized to increase or decrease insulin doses plus or minus 3 units.  SPECIAL INSTRUCTIONS:   I give permission to the school nurse, trained diabetes personnel, and other designated staff members of _________________________school to perform and carry out the diabetes care tasks as outlined by  Johnny [name] Diabetes  Management Plan.  I also consent to the release of the information contained in this Diabetes Medical Management Plan to all staff members and other adults who have custodial care of Johnny [name] and who may need to know this information to maintain [employer] health and safety.    Physician Signature: Johnny Stall, MD,CDE             Date: 02/05/2018

## 2018-02-05 NOTE — Patient Instructions (Signed)
Follow up visit in two months. Please call Dr. Fransico Yuvin Bussiere on Sunday, 02/16/18 between 8:00-9:30 PM to discuss BGs.

## 2018-02-05 NOTE — Progress Notes (Signed)
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  HISTORY OF PRESENT ILLNESS:   [name] is a 22 y.o. Caucasian young man.   Johnny [name] was accompanied by his mother.  1. [name] was seen at [facility] in [place], [place], on 06/26/13 for complaints of weight loss, fatigue, personality change, polyuria, polydipsia, and vomiting. New-onset DM and DKA were diagnosed. 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 Significantly abnormal lab results included: Venous pH 7.257, serum potassium 3.0, serum CO2 15, serum glucose 257, hemoglobin A1c 13.7%, C-peptide 0.19 (normal 0.80-3.09), anti-glutamic acid decarboxylase (GAD) antibody > 30 (normal < 1.0), anti-islet cell antibody 80 (normal < 5). TSH was 2.163, free T4 0.82, free T3 1.8 (normal 2.3-4.2). He was treated initially with infusions of insulin and fluids. When his DKA resolved, he was converted to a multiple daily injection (MDI) insulin plan, using Lantus as a basal insulin and Novolog aspart as a bolus insulin at mealtimes, bedtime, and 2 AM if needed. 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   2. Johnny [name] was discharged from [facility] on 07/04/13. He and his mother participated in our DSSP education program with Ms. Gearldine Bienenstock, RN. 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 When he went into the honeymoon period his Lantus dose was decreased to 6 units at night.  Since then, however, he has exited the honeymoon period and has had a gradual but progressive increase in his exogenous insulin requirement. He started his Dexcom G5 CGM in early 2016. He was converted to an Omnipod pump on 01/16/16. He was subsequently converted to a Dexcom G6.  3. Armstead's last P[facility] visit was on 12/03/17. At that visit I again increased his basal rates from MN through noon.   A. In the interim he has been healthy, but his allergies are really bothering him now.   B. His Omnipod pump has been working well. His Dexcom G6 was working well, but he had several bad sensors, so has been off the sensor for 2-3 weeks. His SGs and BGs correlate well.   Johnny [name] has become increasingly more interactive with others. His speech has improved, but varies from day to day. He is much more socially interactive. Unfortunately, however, he still remains completely dependent on his mother for all of his basic needs.   D. When he plays hard his BGs can drop rapidly.  E. His appetite for french fries, chocolate, and other carbs has increased. Mom has reduced his sugary carb intake.   4. [name]:  Constitutional: Johnny [name] feels "good - 2 thumbs up".   Eyes: Vision seems to be good when he wears his new glasses. He had his last eye appointment in May 2019. The eye doctor did not see any signs of DM damage.   Neck: Johnny [name] has not been touching his neck or complaining of neck pains. There are no other recognized problems of the anterior neck.  Heart: There are no recognized heart problems. The ability to  perform physical activities seems normal for his limited  level of physical activity.  Gastrointestinal: He is still very hungry. Bowel movents seem normal. There are no recognized GI problems. Legs: Muscle mass and strength seem normal. He can perform his usual ADLs and other physical activities without obvious discomfort. He has no numbness, tingling, burning, or pains. No edema is noted.   Feet: The skin on his feet is dry. The dermatologist diagnosed psoriasis. He is now applying a prescription lotion. There are no other obvious foot problems. No edema is noted.  Neurologic: There are no recognized problems with muscle movement and strength, sensation, or coordination. Hypoglycemia: Mom says that he has had some low BGs when he is active.   5. BG meter printout: He checks his BGs 4-6 times daily. Average BG was 227, compared with 222 at his last visit and with 226 at his prior visit. BG range was 120-333, compared with 89-457 at his last visit and with 56-414 at his prior visit. His highest BGs occur in the late evening. His lowest BGs occur in the afternoons.. He had no BGs <80.   6. Dexcom printout: The new sensor only has data for the past two weeks. His average SG was 209, compared with 247 at his last visit and with 226 at his prior visit. His SGs were often higher during the night and again in the evenings. The SG pattern from noon to midnight changes every day depending upon what he eats, how much he eats, and how active he is.   PAST MEDICAL, FAMILY, AND SOCIAL HISTORY    Past Medical History:  Diagnosis Date  . ADHD (attention deficit hyperactivity disorder)   . Allergic rhinitis   . Autism   . Diabetes mellitus without complication (HCC)     Family History  Problem Relation Age of Onset  . Hypothyroidism Mother   . Kidney disease Mother        Mom unsure what her kidney issues are  . Autism Sister   . Mental retardation Sister   . Cancer Maternal Grandfather   . Diabetes Paternal Grandmother        Died at age 104.  . Other Other        Paternal great aunt with "hypoglycemia" unsure why     Current Outpatient Medications:  .  CLARAVIS 40 MG capsule, TK ONE C PO D, Disp: , Rfl: 0 .  hydrOXYzine (ATARAX/VISTARIL) 25 MG tablet, Take 50 mg by mouth at bedtime as needed for anxiety (sleep). Reported on 12/21/2015, Disp: , Rfl:  .  NOVOLOG 100 UNIT/ML  injection, ADMINISTER 200 UNITS VIA INSULIN PUMP EVERY 48 HOURS, Disp: 40 mL, Rfl: 1 .  Urine Glucose-Ketones Test STRP, If urine output and extreme thirst increases, collect urine and check with strip as directed. Disp 1 vial, Disp: 20 each, Rfl: 3 .  ACCU-CHEK FASTCLIX LANCETS MISC, CHECK BLOOD SUGAR 6 TIMES DAILY, Disp: 204 each, Rfl: 6 .  clindamycin-benzoyl peroxide (BENZACLIN) gel, Apply 1 application topically at bedtime., Disp: , Rfl:  .  diphenhydrAMINE (BENADRYL) 25 MG tablet, Take 50 mg by mouth every 6 (six) hours as needed for itching or allergies., Disp: , Rfl:  .  doxycycline (VIBRA-TABS) 100 MG tablet, Take 100 mg by mouth daily. Continuous for acne, Disp: , Rfl:  .  glucagon (GLUCAGEN HYPOKIT) 1 MG SOLR injection, Inject 1mg  IM for extreme hypoglycemia. (Patient not taking: Reported on 02/05/2018), Disp: 2 each, Rfl: 5 .  isotretinoin (ACCUTANE)  10 MG capsule, Take 10 mg by mouth 2 (two) times daily., Disp: , Rfl:  .  tretinoin (RETIN-A) 0.025 % cream, Apply topically at bedtime. Apply sparingly dor acne. May start twice a week and then increase to daily in 1-2 weeks (Patient not taking: Reported on 09/30/2017), Disp: 45 g, Rfl: 1  Allergies as of 02/05/2018  . (No Known Allergies)     reports that he has never smoked. He has never used smokeless tobacco. He reports that he does not drink alcohol. Pediatric History  Patient Guardian Status  . Mother:  [name]   Other Topics Concern  . Not on file  Social History Narrative   Lives with Mom and Sister (17y).  Mom quit smoking 18 years ago.  She has 2 other grown children.  They have a dog at home.  No recent travel.   1. School and Family: He will have another year in the 12th grade in a self-contained special education program. He has health insurance through [facility]. Mom has health insurance as well.  2. Activities: His play and video games.  3. Primary Care Provider: His new PCP is a NP named Herbert Seta, at the [facility].  4. Psychiatrist: None at present  REVIEW OF SYSTEMS: There are no other significant problems involving Edem's other body systems.   Objective:  Vital Signs:  BP 106/64   Pulse 100   Ht 5' 6.73" (1.695 m)   Wt 153 lb 9.6 oz (69.7 kg)   BMI 24.25 kg/m    Ht Readings from Last 3 Encounters:  02/05/18 5' 6.73" (1.695 m) (15 %, Z= -1.02)*  12/03/17 5' 6.85" (1.698 m) (16 %, Z= -0.98)*  10/15/17 5\' 7"  (1.702 m) (18 %, Z= -0.92)*   * Growth percentiles are based on CDC (Boys, 2-20 Years) data.   Wt Readings from Last 3 Encounters:  02/05/18 153 lb 9.6 oz (69.7 kg) (47 %, Z= -0.07)*  12/03/17 161 lb (73 kg) (59 %, Z= 0.24)*  10/15/17 152 lb 3.2 oz (69 kg) (46 %, Z= -0.10)*   * Growth percentiles are based on CDC (Boys, 2-20 Years) data.   HC Readings from Last 3 Encounters:  No data found for [facility]   Body surface area is 1.81 meters squared.  15 %ile (Z= -1.02) based on CDC (Boys, 2-20 Years) Stature-for-age data based on Stature recorded on 02/05/2018. 47 %ile (Z= -0.07) based on CDC (Boys, 2-20 Years) weight-for-age data using vitals from 02/05/2018. No head circumference on file for this encounter.   PHYSICAL EXAM:  Constitutional: The patient appears healthy, well nourished, but tired. His height has plateaued at the 15.30%. He lost 8 pounds since last visit and his weight has decreased to the 47.06%. His BMI has decreased to the 64.91%. He again played with his video game during most of today's clinic visit. He engaged pretty well with me today. He was very friendly and polite. He talked more today and his answers were 1-2 words in length, but fairly appropriate. His affect was normal today, but his insight remains fairly limited. Head: The head is normocephalic. Face: His pustular acne has improved, but he has many small lesions. There are no obvious dysmorphic features. He has a grade 2-3 mustache and a goatee today.   Eyes: The eyes appear to be normally  formed and spaced. Gaze is conjugate. There is no obvious arcus or proptosis. Moisture appears normal. Ears: The ears are normally placed and appear externally normal.  Mouth: The oropharynx and tongue appear normal. Dentition appears to be normal for age. Oral moisture is normal. Neck: The neck appears to be visibly normal. No carotid bruits are noted. His strap muscles are large. The thyroid gland is more enlarged at about 22 grams in size. Today both lobes are enlarged, with the left lobe being larger. The consistency of the thyroid gland is relatively firm.  The thyroid gland is not tender to palpation. Lungs: The lungs are clear to auscultation. Air movement is good. Heart: Heart rate and rhythm are regular. Heart sounds S1 and S2 are normal. I did not appreciate any pathologic cardiac murmurs. Abdomen: The abdomen is normal in size for the patient's age, but larger. Bowel sounds are normal. There is no obvious hepatomegaly, splenomegaly, or other mass effect.  Arms: Muscle size and bulk are normal for age. Hands: There is no obvious tremor. Phalangeal and metacarpophalangeal joints are normal. Palmar muscles are normal for age. Palmar skin is normal. Palmar moisture is also normal. Legs: Muscles appear normal for age. No edema is present. Feet: Feet are normally formed. Dorsalis pedal pulses are faint 1+ on the right and 1+ on the left.  I do not see any tinea pedis.  Neurologic: Strength is fairly normal for age in both the upper and lower extremities. Muscle tone is normal. Sensation to touch is normal in both legs and both feet.      LAB DATA: Results for orders placed or performed in visit on 02/05/18 (from the past 504 hour(s))  POCT Glucose (Device for Home Use)   Collection Time: 02/05/18  2:06 PM  Result Value Ref Range   Glucose Fasting, POC  70 - 99 mg/dL   POC Glucose 161251 (A) 70 - 99 mg/dl    Labs 0/96/047/17/19: [id number] 10.0%, CBG 251  Labs 12/03/17: CBG 334  Labs 09/30/17: HbA1c 8.7%,  CBG 254  Labs 07/24/17: CBG 263; TSH 2.18, free T4 1.1, free T3 3.3; urine microalbumin/creatinine ration 2; CMP normal except glucose 263  Labs 05/07/17: HbA1c 9.3%, CBG 246  Labs 03/04/17: CBG 494. He had not bolused after breakfast due to mom putting in a new pod just before they left home to drive to his appointment today.  Labs 12/26/16: HbA1c 10.2%, CBG 289  Labs 09/25/16: HbA1c 9.5%, CBG 261  Labs 06/27/16: CBG 114; TSH 1.54, free T4 1.1, free T3 3.4; CMP normal; urine microalbumin/creatinine ratio 2; cholesterol 108, triglycerides 153, HDL 43, LDL 34  Labs 04/10/16: HbA1c 9.5%, CBG 145  Labs 02/01/16: CBG 391, TSH 1.72  Labs 12/21/15: HbA1c 8.4%  Labs 10/10/15: HbA1c 8.2%.  Labs 08/09/15: HbA1c 8.5%  Labs 1012/16: HbA1c 8.8%; TSH 2.624, free T4 0.99, free T3 3.4; urine microalbumin/creatinine ratio <0.2; CMP normal  Labs 01/27/15: HbA1c 8.8%  Labs 10/25/14: Hemoglobin A1c 8.2%, compared with 7.9% at the last visit and with 8.4% at the prior visit.    Labs 10/22/13: CMP normal; C-peptide 0.65, increased from 0.19 at diagnosis (normal 0.80-3.90); TSH 1.265, free T4 1.01, free T3 3.4, TPO antibody 10.4    Assessment and Plan:   ASSESSMENT:  1. T1DM:   A. Since exiting the honeymoon period, [name] has required more and more exogenous insulin than he needed before. His BGs had been much more variable and have increased even more since his last visit. His SGs, however, have been a bit lower.   B. He needs a higher basal rate during the night. His carb intake also needs to be  reduced.   C. Ms. Zachery Dauer is doing an excellent job of taking care of Barak.    2. Hypoglycemia: He has had no BGs or SGs <80 since adjusting his insulin pump settings.   3-4. Goiter/thyroiditis:   A. He has the personal history of autoimmune T1DM. He also has the family history of autoimmune thyroid disease.  B. He continues to have waxing and waning of thyroid gland size. At his last visit the thyroid gland  was smaller overall, but is the same size today as it was at his last visit.    C. The pattern of waxing and waning of thyroid gland size and thyroid lobe sizes is c/w evolving Hashimoto's thyroiditis. It is almost a certainty that he will become hypothyroid in the future.   D.  He was euthyroid in April 2015, in October 2016, and in January 2019. His TSH was mid-range euthyroid in both July and December 2017, but somewhat higher in January 2019.   5. Adjustment reaction: Things are going pretty well. Mom is trying very hard to take good care of Ramell. We discussed options for reducing his carb intake. 6. Autism/ADHD/ mental retardation: Dmauri's autism has improved somewhat in that he is more social and outgoing. However, he remains completely dependent upon mom for his meals, shelter, clothing, recreational activities, physical activities, and T1DM care. 7. Dehydration: He looks well hydrated today. It appears that when Tywaun's BGs are high he sometimes does not drink enough liquid to meet his needs, and so becomes dehydrated.  8. Peripheral neuropathy: His neuropathy is not evident today.   PLAN:  1. Diagnostic: Continue BG checks as planned. Call on Sunday, July 28th.  Call earlier if having problems.   2. Therapeutic: Continue Small bedtime snack.   New basal rates: MN: 1.30 -> 1.40 4 AM: 1.35 -> 1.45 8 AM: 1.05 Noon: 0.800 06 PM: 1.050 -> 1.15  Continue ISF: 50  Continue BG targets: MN: 180 6 AM: 150 9 PM: 180  Continue ICRs: MN: 15: 6 AM:12 Noon: 15  3. Patient education: We discussed his elevated BGs, his hypoglycemia, his goiter, and all of the above at length. Mom is concerned that his BGs and insulin doses are higher, but is also worried about his BGs going too low. I told her that we will make tiny changes in his basal rates.  I told her that the larger he becomes and the less insulin he produces on his own, the more insulin he needs. I asked mom to call me if Darnelle  has any BGs during the night or in the mornings that are <80 and unexplained. I also asked her to keep a log of unusual high BGs or lower BGs and the causes.  4. Follow-up: three months  Level of Service: This visit lasted in excess of 70 minutes. More than 50% of the visit was devoted to counseling.   [name], MD, CDE Adult and Pediatric Endocrinology

## 2018-02-07 LAB — POCT GLYCOSYLATED HEMOGLOBIN (HGB A1C): HbA1c, POC (controlled diabetic range): 10 % — AB (ref 0.0–7.0)

## 2018-02-16 ENCOUNTER — Telehealth (INDEPENDENT_AMBULATORY_CARE_PROVIDER_SITE_OTHER): Payer: Self-pay | Admitting: "Endocrinology

## 2018-02-16 NOTE — Telephone Encounter (Signed)
Received telephone call from mother. 1. Overall status: Most BGs are doing fairly well.  2. New problems: He had BGs in the mornings of 84-120 in the mornings and he had an 80 one morning at 2 AM. 3. Last pod change: 02/13/18 4. Rapid-acting insulin: Novolog in his Omnipod pump 5. BG log: 2 AM, Breakfast, Lunch, Supper, Bedtime 7/26 xxx 94 xxx 196 120 - 28 gram snack 7/27 80 159 259 176 xxx - Snack at 2 AM 7/28 xxx 120 244 162 pending 6. Assessment: He needs lower basal rates from dinner to 4 AM. 7. Plan:  New Basal rates: MN: 1.40 -> 1.20 4 AM: 1.45 8 AM: 1.05 12 PM: 0.80 6 PM: 1.15 -> 1.05 8. FU call: Wednesday evening, or earlier if having problems Molli KnockMichael Brennan, MD, CDE

## 2018-02-17 ENCOUNTER — Other Ambulatory Visit (INDEPENDENT_AMBULATORY_CARE_PROVIDER_SITE_OTHER): Payer: Self-pay | Admitting: *Deleted

## 2018-02-19 ENCOUNTER — Telehealth (INDEPENDENT_AMBULATORY_CARE_PROVIDER_SITE_OTHER): Payer: Self-pay | Admitting: "Endocrinology

## 2018-02-19 NOTE — Telephone Encounter (Signed)
Received telephone call from mother. 1. Overall status: Most BGs are doing fairly well.  2. New problems: He has higher BGs when he eats more carbs.  3. Last pod change: 02/13/18 4. Rapid-acting insulin: Novolog in his Omnipod pump 5. BG log: 2 AM, Breakfast, Lunch, Supper, Bedtime 7/26 xxx 94 xxx 196 120 - 28 gram snack 7/27 80 159 259 176 xxx - Snack at 2 AM 7/28 xxx 120 244 162 128 7/29 Xxx 105 191 217 283 7/30 Xxx 163 132 324 239 - He had a large lunch and pizza at supper. 7/31 Xxx 234 186 241 pending 6. Assessment: The reductions in basal rates at MN and at 6 PM corrected the hypoglycemia that he was having. Unfortunately, when [name] eats too many carbs, mom often undercounts the carbs.  7. Plan:  Continuee Basal rates: MN: 1.20 4 AM: 1.45 8 AM: 1.05 12 PM: 0.80 6 PM: 1.05 8. FU call: Sunday evening 03/02/18, or earlier if having problems Molli KnockMichael Brennan, MD, CDE

## 2018-03-02 ENCOUNTER — Telehealth (INDEPENDENT_AMBULATORY_CARE_PROVIDER_SITE_OTHER): Payer: Self-pay | Admitting: "Endocrinology

## 2018-03-02 NOTE — Telephone Encounter (Signed)
Received telephone call from mother. 1. Overall status: Most BGs are doing fairly well.  2. New problems: He has higher BGs when he eats more carbs.  3. Last pod change: 02/13/18 4. Rapid-acting insulin: Novolog in his Omnipod pump 5. BG log: 2 AM, Breakfast, Lunch, Supper, Bedtime 7/26 xxx 94 xxx 196 120 - 28 gram snack 7/27 80 159 259 176 xxx - Snack at 2 AM 7/28 xxx 120 244 162 128 7/29 xxx 105 191 217 283 7/30 Xxx 163 132 324 239 - He had a large lunch and pizza at supper. 7/31 xxx 234 186 241 Pending  8/09 234 158 284 130 159 - High carb cereal for breakfast 8/10 xxx 158 145 196 196 - Beef noodles at lunch 6/11 xxx 135 151 189 Pending - Chicken alfredo at lunch 6. Assessment: Overall Johnny [name]'s BGs are good for him because he does not have the intellectual capacity to recognize and self-treat hypoglycemia. Unfortunately, when Johnny [name] eats too many carbs, mom often undercounts the carbs.  7. Plan:  Continue Basal rates: MN: 1.20 4 AM: 1.45 8 AM: 1.05 12 PM: 0.80 6 PM: 1.05 8. FU call: Next Sunday evening 03/02/18, or earlier if having problems Molli KnockMichael Cambelle Suchecki, MD, CDE

## 2018-03-09 ENCOUNTER — Telehealth (INDEPENDENT_AMBULATORY_CARE_PROVIDER_SITE_OTHER): Payer: Self-pay | Admitting: "Endocrinology

## 2018-03-09 NOTE — Telephone Encounter (Signed)
Received telephone call from mother. 1. Overall status: Most BGs are doing okay, but his schedule has been variable and his BGs have been variable as well. He has been eating more french fries. 2. New problems: He has higher BGs when he eats more carbs.  3. Last pod change: 03/06/18 4. Rapid-acting insulin: Novolog in his Omnipod pump 5. BG log: 2 AM, Breakfast, Lunch, Supper, Bedtime 8/16 xxx 188 200 260 310 8/17 xxx 188 196 233 232 8/18 xxx 192 458/289 437/400 388 - He has been very excited knowing that he was going to have a birthday party at about 3:30 PM. 6. Assessment: Zacariah's BGs are much higher. He has been eating more carbs. Today he was very excited, and had more adrenaline than usual. His pod may have been going bad. Mom does not have any more Novolog Flexpens.  7. Plan:  New Basal rates: MN: 1.20 -> 1.35 4 AM: 1.45 -> 1.60 8 AM: 1.05 -> 1.15 12 PM: 0.80 -> 0.90 6 PM: 1.05 -> 1.15 8. FU call: Call Gearldine BienenstockLorena Ibarra on Wednesday August 28th, or earlier if having problems. Call Evorn GongKassina Wyrick to morrow to order more Novolog Flexpens and Lantus insulin pens Molli KnockMichael Kyonna Frier, MD, CDE

## 2018-03-10 ENCOUNTER — Other Ambulatory Visit (INDEPENDENT_AMBULATORY_CARE_PROVIDER_SITE_OTHER): Payer: Self-pay | Admitting: *Deleted

## 2018-03-10 ENCOUNTER — Telehealth (INDEPENDENT_AMBULATORY_CARE_PROVIDER_SITE_OTHER): Payer: Self-pay | Admitting: *Deleted

## 2018-03-10 ENCOUNTER — Telehealth (INDEPENDENT_AMBULATORY_CARE_PROVIDER_SITE_OTHER): Payer: Self-pay | Admitting: "Endocrinology

## 2018-03-10 MED ORDER — INSULIN ASPART 100 UNIT/ML FLEXPEN: PEN_INJECTOR | SUBCUTANEOUS | 5 refills | Status: DC

## 2018-03-10 MED ORDER — INSULIN GLARGINE 100 UNIT/ML SOLOSTAR PEN: PEN_INJECTOR | SUBCUTANEOUS | 5 refills | Status: DC

## 2018-03-10 NOTE — Telephone Encounter (Signed)
Who's calling (name and relationship to patient) : Mom/Elaine B.  Best contact number: [phone]  Provider they see: Dr Fransico MichaelBrennan   Reason for call: caller states that she is calling in blood sugar numbers 03/09/18 855pm - spoke with on call - general  Call ID:  [id number]

## 2018-03-10 NOTE — Telephone Encounter (Signed)
Per Dr. Juluis MireBrennan's note from last night I placed a script this morning for Lantus and Novolog in case of pump failure.

## 2018-03-18 ENCOUNTER — Ambulatory Visit: Payer: Medicaid Other | Admitting: Nurse Practitioner

## 2018-03-18 ENCOUNTER — Encounter: Payer: Self-pay | Admitting: Nurse Practitioner

## 2018-03-18 ENCOUNTER — Telehealth: Payer: Self-pay

## 2018-03-18 VITALS — BP 96/60 | HR 90 | Resp 16 | Ht 67.0 in | Wt 155.2 lb

## 2018-03-18 DIAGNOSIS — F840 Autistic disorder: Secondary | ICD-10-CM

## 2018-03-18 DIAGNOSIS — L700 Acne vulgaris: Secondary | ICD-10-CM

## 2018-03-18 DIAGNOSIS — J309 Allergic rhinitis, unspecified: Secondary | ICD-10-CM | POA: Diagnosis not present

## 2018-03-18 DIAGNOSIS — E1065 Type 1 diabetes mellitus with hyperglycemia: Secondary | ICD-10-CM

## 2018-03-18 NOTE — Progress Notes (Signed)
[facility] [address] [place], [place] [id number]  Internal MEDICINE  Office Visit Note  Patient Name: Johnny [name]  1997  [id number]  Date of Service: 03/26/2018  Chief Complaint  Patient presents with  . Medical Management of Chronic Issues    6 month follow up  . Letter for School/Work    pt mother has paparwork   . Allergic Rhinitis     The patient is here for routine follow up exam. Has long history of cystic acne on his face. To a lesser degree, he has this on back, shoulders, and chest. He is using topical treatment and has used on oral antibiotics. Helped for a little while, but no longer responding to these treatmens. Recently saw dermatology and was started on new medication which has helped tremendously. He does need new referral to the dermatologist as his insurance requires a new referral every 6 months.  Has history of moderate allergic rhinitis. Very sensitive to environmental triggers as well as foods. His usual medications to help his allergies aren't helping as much as they used to.       Current Medication: Outpatient Encounter Medications as of 03/18/2018  Medication Sig  . ACCU-CHEK FASTCLIX LANCETS MISC CHECK BLOOD SUGAR 6 TIMES DAILY  . CLARAVIS 40 MG capsule TK ONE C PO D  . clindamycin-benzoyl peroxide (BENZACLIN) gel Apply 1 application topically at bedtime.  . diphenhydrAMINE (BENADRYL) 25 MG tablet Take 50 mg by mouth every 6 (six) hours as needed for itching or allergies.  Marland Kitchen. doxycycline (VIBRA-TABS) 100 MG tablet Take 100 mg by mouth daily. Continuous for acne  . hydrOXYzine (ATARAX/VISTARIL) 25 MG tablet Take 50 mg by mouth at bedtime as needed for anxiety (sleep). Reported on 12/21/2015  . insulin aspart (NOVOLOG FLEXPEN) 100 UNIT/ML FlexPen Use up to 50 units daily in the event pump fails  . Insulin Glargine (LANTUS SOLOSTAR) 100 UNIT/ML Solostar Pen Use up to 50 units daily in the event pump fails  . NOVOLOG 100 UNIT/ML injection  ADMINISTER 200 UNITS VIA INSULIN PUMP EVERY 48 HOURS  . Urine Glucose-Ketones Test STRP If urine output and extreme thirst increases, collect urine and check with strip as directed. Disp 1 vial  . [DISCONTINUED] isotretinoin (ACCUTANE) 10 MG capsule Take 10 mg by mouth 2 (two) times daily.  Marland Kitchen. glucagon (GLUCAGEN HYPOKIT) 1 MG SOLR injection Inject 1mg  IM for extreme hypoglycemia.  Marland Kitchen. tretinoin (RETIN-A) 0.025 % cream Apply topically at bedtime. Apply sparingly dor acne. May start twice a week and then increase to daily in 1-2 weeks   No facility-administered encounter medications on file as of 03/18/2018.     Surgical History: Past Surgical History:  Procedure Laterality Date  . DENTAL REHABILITATION      Medical History: Past Medical History:  Diagnosis Date  . ADHD (attention deficit hyperactivity disorder)   . Allergic rhinitis   . Autism   . Diabetes mellitus without complication (HCC)     Family History: Family History  Problem Relation Age of Onset  . Hypothyroidism Mother   . Kidney disease Mother        Mom unsure what her kidney issues are  . Autism Sister   . Mental retardation Sister   . Cancer Maternal Grandfather   . Diabetes Paternal Grandmother        Died at age 734.  . Other Other        Paternal great aunt with "hypoglycemia" unsure why    Social History  Socioeconomic History  . Marital status: Single    Spouse name: Not on file  . Number of children: Not on file  . Years of education: Not on file  . Highest education level: Not on file  Occupational History  . Not on file  Social Needs  . Financial resource strain: Not on file  . Food insecurity:    Worry: Not on file    Inability: Not on file  . Transportation needs:    Medical: Not on file    Non-medical: Not on file  Tobacco Use  . Smoking status: Never Smoker  . Smokeless tobacco: Never Used  Substance and Sexual Activity  . Alcohol use: No  . Drug use: Not on file  . Sexual  activity: Not on file  Lifestyle  . Physical activity:    Days per week: Not on file    Minutes per session: Not on file  . Stress: Not on file  Relationships  . Social connections:    Talks on phone: Not on file    Gets together: Not on file    Attends religious service: Not on file    Active member of club or organization: Not on file    Attends meetings of clubs or organizations: Not on file    Relationship status: Not on file  . Intimate partner violence:    Fear of current or ex partner: Not on file    Emotionally abused: Not on file    Physically abused: Not on file    Forced sexual activity: Not on file  Other Topics Concern  . Not on file  Social History Narrative   Lives with Mom and Sister (17y).  Mom quit smoking 18 years ago.  She has 2 other grown children.  They have a dog at home.  No recent travel.      Review of Systems  Constitutional: Negative for chills, fatigue and unexpected weight change.  HENT: Positive for postnasal drip. Negative for congestion, rhinorrhea, sneezing and sore throat.   Eyes: Negative for redness.  Respiratory: Negative for cough, chest tightness and shortness of breath.   Cardiovascular: Negative for chest pain and palpitations.  Gastrointestinal: Negative for abdominal pain, constipation, diarrhea, nausea and vomiting.  Endocrine:       Patient on insulin pump due to type 1 diabetes. Followed by endocrinology.  Genitourinary: Negative.  Negative for dysuria and frequency.  Musculoskeletal: Negative for arthralgias, back pain, joint swelling and neck pain.  Skin: Negative for rash.       Cystic acne, mostly on his face. Improving with new treatment.   Allergic/Immunologic: Positive for environmental allergies and food allergies.  Neurological: Negative for dizziness, tremors, numbness and headaches.  Hematological: Negative for adenopathy. Does not bruise/bleed easily.  Psychiatric/Behavioral: Negative for behavioral problems  (Depression), sleep disturbance and suicidal ideas. The patient is nervous/anxious.     Today's Vitals   03/18/18 1538  BP: 96/60  Pulse: 90  Resp: 16  SpO2: 98%  Weight: 155 lb 3.2 oz (70.4 kg)  Height: 5\' 7"  (1.702 m)   Physical Exam  Constitutional: He is oriented to person, place, and time. He appears well-developed and well-nourished. No distress.  HENT:  Head: Normocephalic and atraumatic.  Nose: Rhinorrhea present. Right sinus exhibits no maxillary sinus tenderness and no frontal sinus tenderness. Left sinus exhibits no maxillary sinus tenderness and no frontal sinus tenderness.  Mouth/Throat: Oropharynx is clear and moist. No oropharyngeal exudate.  Eyes: Pupils are equal, round,  and reactive to light. EOM are normal.  Neck: Normal range of motion. Neck supple. No JVD present. No tracheal deviation present. No thyromegaly present.  Cardiovascular: Normal rate, regular rhythm and normal heart sounds. Exam reveals no gallop and no friction rub.  No murmur heard. Pulmonary/Chest: Effort normal. No respiratory distress. He has no wheezes. He has no rales. He exhibits no tenderness.  Abdominal: Soft. Bowel sounds are normal.  Musculoskeletal: Normal range of motion.  Lymphadenopathy:    He has no cervical adenopathy.  Neurological: He is alert and oriented to person, place, and time. No cranial nerve deficit.  Patient is at his neurological baseline.   Skin: Skin is warm and dry. He is not diaphoretic.  Improved since recent visit to dermatology.  Psychiatric: His speech is normal and behavior is normal. Judgment and thought content normal. His mood appears anxious.  Nursing note and vitals reviewed.  Assessment/Plan: 1. Chronic allergic rhinitis Continue current allergy medication. Refer to Dr. Freda Munro for further evaluation of chronic allergies.  - Ambulatory referral to Allergy  2. Acne vulgaris Refer back to dermatology for continued treatment. - Ambulatory  referral to Dermatology  3. Uncontrolled type 1 diabetes mellitus with hyperglycemia (HCC) Regular visits with endocrinology as scheduled.   4. Autistic disorder, active Filled out school paperwork and returned to guardian.   General Counseling: Tyrique verbalizes understanding of the findings of todays visit and agrees with plan of treatment. I have discussed any further diagnostic evaluation that may be needed or ordered today. We also reviewed his medications today. he has been encouraged to call the office with any questions or concerns that should arise related to todays visit.  This patient was seen by Vincent Gros FNP Collaboration with Dr Lyndon Code as a part of collaborative care agreement  Orders Placed This Encounter  Procedures  . Ambulatory referral to Allergy  . Ambulatory referral to Dermatology    Time spent: 25 Minutes      Dr Lyndon Code Internal medicine

## 2018-03-18 NOTE — Telephone Encounter (Signed)
FAXED RECENT NOTE AND MEDICATION LIST TO THE [facility] [phone]

## 2018-03-25 ENCOUNTER — Ambulatory Visit (INDEPENDENT_AMBULATORY_CARE_PROVIDER_SITE_OTHER): Payer: Medicaid Other | Admitting: Internal Medicine

## 2018-03-25 ENCOUNTER — Encounter: Payer: Self-pay | Admitting: Internal Medicine

## 2018-03-25 VITALS — BP 104/70 | HR 77 | Resp 16 | Ht 67.0 in | Wt 156.0 lb

## 2018-03-25 DIAGNOSIS — R067 Sneezing: Secondary | ICD-10-CM

## 2018-03-25 DIAGNOSIS — H04209 Unspecified epiphora, unspecified lacrimal gland: Secondary | ICD-10-CM

## 2018-03-25 DIAGNOSIS — J309 Allergic rhinitis, unspecified: Secondary | ICD-10-CM

## 2018-03-25 DIAGNOSIS — F840 Autistic disorder: Secondary | ICD-10-CM

## 2018-03-25 NOTE — Progress Notes (Signed)
[facility] [address] [place], [place] [address]  Pulmonary Sleep Medicine   Office Visit Note  Patient Name: Johnny [name] DOB: Aug 13, 1995 MRN [id number]  Date of Service: 03/25/2018  Complaints/HPI:  Pt is new patient. Mother in exam room.  Pt has terrible sneezing, watery eyes, rhinorhea, and coughing are noted seasonally. She reports she has tried OTC Zyrtec, Flonase, claritin etc with no relief. The only thing that helps is benadryl and that makes him too sleep to take while he's in school. Mom reports they live in an older house.  She describes their yard as lots of trees, and the yard is moist most of the time.  She denies seeing mold in their home. She denies episodes of SOB.  She denies a history of asthma, and only reports a history of DM1. His endocrinologist suggested allergy testing so the patients PCP referred him to [place] for pulmonology/allergy testing.     ROS  General: (-) fever, (-) chills, (-) night sweats, (-) weakness Skin: (-) rashes, (-) itching,. Eyes: (-) visual changes, (-) redness, (-) itching. Nose and Sinuses: (-) nasal stuffiness or itchiness, (-) postnasal drip, (-) nosebleeds, (-) sinus trouble. Mouth and Throat: (-) sore throat, (-) hoarseness. Neck: (-) swollen glands, (-) enlarged thyroid, (-) neck pain. Respiratory: + cough, (-) bloody sputum, + shortness of breath, + wheezing. Cardiovascular: - ankle swelling, (-) chest pain. Lymphatic: (-) lymph node enlargement. Neurologic: (-) numbness, (-) tingling. Psychiatric: (-) anxiety, (-) depression   Current Medication: Outpatient Encounter Medications as of 03/25/2018  Medication Sig  . ACCU-CHEK FASTCLIX LANCETS MISC CHECK BLOOD SUGAR 6 TIMES DAILY  . CLARAVIS 40 MG capsule TK ONE C PO D  . clindamycin-benzoyl peroxide (BENZACLIN) gel Apply 1 application topically at bedtime.  . diphenhydrAMINE (BENADRYL) 25 MG tablet Take 50 mg by mouth every 6 (six) hours as needed for itching or  allergies.  Marland Kitchen doxycycline (VIBRA-TABS) 100 MG tablet Take 100 mg by mouth daily. Continuous for acne  . glucagon (GLUCAGEN HYPOKIT) 1 MG SOLR injection Inject 1mg  IM for extreme hypoglycemia.  . hydrOXYzine (ATARAX/VISTARIL) 25 MG tablet Take 50 mg by mouth at bedtime as needed for anxiety (sleep). Reported on 12/21/2015  . insulin aspart (NOVOLOG FLEXPEN) 100 UNIT/ML FlexPen Use up to 50 units daily in the event pump fails  . Insulin Glargine (LANTUS SOLOSTAR) 100 UNIT/ML Solostar Pen Use up to 50 units daily in the event pump fails  . NOVOLOG 100 UNIT/ML injection ADMINISTER 200 UNITS VIA INSULIN PUMP EVERY 48 HOURS  . tretinoin (RETIN-A) 0.025 % cream Apply topically at bedtime. Apply sparingly dor acne. May start twice a week and then increase to daily in 1-2 weeks  . Urine Glucose-Ketones Test STRP If urine output and extreme thirst increases, collect urine and check with strip as directed. Disp 1 vial   No facility-administered encounter medications on file as of 03/25/2018.     Surgical History: Past Surgical History:  Procedure Laterality Date  . DENTAL REHABILITATION      Medical History: Past Medical History:  Diagnosis Date  . ADHD (attention deficit hyperactivity disorder)   . Allergic rhinitis   . Autism   . Diabetes mellitus without complication (HCC)     Family History: Family History  Problem Relation Age of Onset  . Hypothyroidism Mother   . Kidney disease Mother        Mom unsure what her kidney issues are  . Autism Sister   . Mental retardation Sister   .  Cancer Maternal Grandfather   . Diabetes Paternal Grandmother        Died at age 61.  . Other Other        Paternal great aunt with "hypoglycemia" unsure why    Social History: Social History   Socioeconomic History  . Marital status: Single    Spouse name: Not on file  . Number of children: Not on file  . Years of education: Not on file  . Highest education level: Not on file  Occupational History   . Not on file  Social Needs  . Financial resource strain: Not on file  . Food insecurity:    Worry: Not on file    Inability: Not on file  . Transportation needs:    Medical: Not on file    Non-medical: Not on file  Tobacco Use  . Smoking status: Never Smoker  . Smokeless tobacco: Never Used  Substance and Sexual Activity  . Alcohol use: No  . Drug use: Not on file  . Sexual activity: Not on file  Lifestyle  . Physical activity:    Days per week: Not on file    Minutes per session: Not on file  . Stress: Not on file  Relationships  . Social connections:    Talks on phone: Not on file    Gets together: Not on file    Attends religious service: Not on file    Active member of club or organization: Not on file    Attends meetings of clubs or organizations: Not on file    Relationship status: Not on file  . Intimate partner violence:    Fear of current or ex partner: Not on file    Emotionally abused: Not on file    Physically abused: Not on file    Forced sexual activity: Not on file  Other Topics Concern  . Not on file  Social History Narrative   Lives with Mom and Sister (17y).  Mom quit smoking 18 years ago.  She has 2 other grown children.  They have a dog at home.  No recent travel.    Vital Signs: Blood pressure 104/70, pulse 77, resp. rate 16, height 5\' 7"  (1.702 m), weight 156 lb (70.8 kg), SpO2 98 %.  Examination: General Appearance: The patient is well-developed, well-nourished, and in no distress. Skin: Gross inspection of skin unremarkable. Head: normocephalic, no gross deformities. Eyes: no gross deformities noted. ENT: ears appear grossly normal no exudates. Neck: Supple. No thyromegaly. No LAD. Respiratory: o rhonchi noted. Cardiovascular: Normal S1 and S2 without murmur or rub. Extremities: No cyanosis. pulses are equal. Neurologic: Alert and oriented. No involuntary movements.  LABS: Recent Results (from the past 2160 hour(s))  POCT Glucose  (Device for Home Use)     Status: Abnormal   Collection Time: 02/05/18  2:06 PM  Result Value Ref Range   Glucose Fasting, POC  70 - 99 mg/dL   POC Glucose 031 (A) 70 - 99 mg/dl    Comment: @1045  toast, 2 cups milk, eggs and cheese  POCT glycosylated hemoglobin (Hb A1C)     Status: Abnormal   Collection Time: 02/07/18  8:58 AM  Result Value Ref Range   Hemoglobin A1C  4.0 - 5.6 %   HbA1c POC (<> result, manual entry)  4.0 - 5.6 %   HbA1c, POC (prediabetic range)  5.7 - 6.4 %   HbA1c, POC (controlled diabetic range) 10.0 (A) 0.0 - 7.0 %    Radiology:  Dg Chest 2 View  Result Date: 10/15/2017 CLINICAL DATA:  Cough, fever, sore throat EXAM: CHEST - 2 VIEW COMPARISON:  None. FINDINGS: No active infiltrate or effusion is seen. Mediastinal and hilar contours are unremarkable. The heart is within normal limits in size. No bony abnormality is seen. IMPRESSION: No active cardiopulmonary disease. Electronically Signed   By: Dwyane Dee M.D.   On: 10/15/2017 17:21    No results found.  No results found.    Assessment and Plan: Patient Active Problem List   Diagnosis Date Noted  . Acne vulgaris 09/22/2017  . Diabetic peripheral neuropathy associated with type 1 diabetes mellitus (HCC) 07/24/2017  . Mental retardation 12/21/2015  . Dehydration 04/14/2014  . Hypoglycemia associated with diabetes (HCC) 10/23/2013  . ADHD (attention deficit hyperactivity disorder) 08/24/2013  . Autistic disorder, active 07/04/2013  . Adjustment reaction 07/03/2013  . Goiter 07/03/2013  . Type I diabetes mellitus with complication, uncontrolled (HCC) 06/26/2013    1. Chronic allergic rhinitis Discussed allergy testing with patient and mother.  She is agreeable and testing will be scheduled.   2. Sneezing with watery eyes Rhinorrhea, and watery eyes with sneezing refractory to OTC allergy medications.   3. Autistic disorder, active   General Counseling: I have discussed the findings of the evaluation  and examination with Greig Castilla.  I have also discussed any further diagnostic evaluation thatmay be needed or ordered today. Mikko verbalizes understanding of the findings of todays visit. We also reviewed his medications today and discussed drug interactions and side effects including but not limited excessive drowsiness and altered mental states. We also discussed that there is always a risk not just to him but also people around him. he has been encouraged to call the office with any questions or concerns that should arise related to todays visit.   Time spent: 25 This patient was seen by Blima Ledger AGNP-C in Collaboration with Dr. Freda Munro as a part of collaborative care agreement.   I have personally obtained a history, examined the patient, evaluated laboratory and imaging results, formulated the assessment and plan and placed orders.    Yevonne Pax, MD [facility] Pulmonary and Critical Care Sleep medicine

## 2018-03-25 NOTE — Patient Instructions (Signed)
Allergies An allergy is when your body reacts to a substance in a way that is not normal. An allergic reaction can happen after you:  Eat something.  Breathe in something.  Touch something.  You can be allergic to:  Things that are only around during certain seasons, like molds and pollens.  Foods.  Drugs.  Insects.  Animal dander.  What are the signs or symptoms?  Puffiness (swelling). This may happen on the lips, face, tongue, mouth, or throat.  Sneezing.  Coughing.  Breathing loudly (wheezing).  Stuffy nose.  Tingling in the mouth.  A rash.  Itching.  Itchy, red, puffy areas of skin (hives).  Watery eyes.  Throwing up (vomiting).  Watery poop (diarrhea).  Dizziness.  Feeling faint or fainting.  Trouble breathing or swallowing.  A tight feeling in the chest.  A fast heartbeat. How is this diagnosed? Allergies can be diagnosed with:  A medical and family history.  Skin tests.  Blood tests.  A food diary. A food diary is a record of all the foods, drinks, and symptoms you have each day.  The results of an elimination diet. This diet involves making sure not to eat certain foods and then seeing what happens when you start eating them again.  How is this treated? There is no cure for allergies, but allergic reactions can be treated with medicine. Severe reactions usually need to be treated at a hospital. How is this prevented? The best way to prevent an allergic reaction is to avoid the thing you are allergic to. Allergy shots and medicines can also help prevent reactions in some cases. This information is not intended to replace advice given to you by your health care provider. Make sure you discuss any questions you have with your health care provider. Document Released: 11/03/2012 Document Revised: 03/05/2016 Document Reviewed: 04/20/2014 Elsevier Interactive Patient Education  2018 [employer]  

## 2018-03-26 DIAGNOSIS — J309 Allergic rhinitis, unspecified: Secondary | ICD-10-CM | POA: Insufficient documentation

## 2018-03-26 DIAGNOSIS — E1065 Type 1 diabetes mellitus with hyperglycemia: Secondary | ICD-10-CM | POA: Insufficient documentation

## 2018-04-02 ENCOUNTER — Other Ambulatory Visit (INDEPENDENT_AMBULATORY_CARE_PROVIDER_SITE_OTHER): Payer: Self-pay | Admitting: *Deleted

## 2018-04-08 ENCOUNTER — Ambulatory Visit: Payer: Self-pay | Admitting: Internal Medicine

## 2018-04-15 ENCOUNTER — Inpatient Hospital Stay
Admission: EM | Admit: 2018-04-15 | Discharge: 2018-04-18 | DRG: 638 | Disposition: A | Discharge status: Home / Self Care | Payer: Medicaid Other | Attending: Internal Medicine | Admitting: Internal Medicine

## 2018-04-15 DIAGNOSIS — F840 Autistic disorder: Secondary | ICD-10-CM | POA: Diagnosis present

## 2018-04-15 DIAGNOSIS — N289 Disorder of kidney and ureter, unspecified: Secondary | ICD-10-CM

## 2018-04-15 DIAGNOSIS — F909 Attention-deficit hyperactivity disorder, unspecified type: Secondary | ICD-10-CM | POA: Diagnosis present

## 2018-04-15 DIAGNOSIS — E875 Hyperkalemia: Secondary | ICD-10-CM | POA: Diagnosis present

## 2018-04-15 DIAGNOSIS — R0602 Shortness of breath: Secondary | ICD-10-CM

## 2018-04-15 DIAGNOSIS — Z79899 Other long term (current) drug therapy: Secondary | ICD-10-CM

## 2018-04-15 DIAGNOSIS — E1010 Type 1 diabetes mellitus with ketoacidosis without coma: Principal | ICD-10-CM | POA: Diagnosis present

## 2018-04-15 DIAGNOSIS — D72829 Elevated white blood cell count, unspecified: Secondary | ICD-10-CM

## 2018-04-15 DIAGNOSIS — N179 Acute kidney failure, unspecified: Secondary | ICD-10-CM | POA: Diagnosis present

## 2018-04-15 DIAGNOSIS — E1042 Type 1 diabetes mellitus with diabetic polyneuropathy: Secondary | ICD-10-CM | POA: Diagnosis present

## 2018-04-15 DIAGNOSIS — Z810 Family history of intellectual disabilities: Secondary | ICD-10-CM

## 2018-04-15 DIAGNOSIS — Z833 Family history of diabetes mellitus: Secondary | ICD-10-CM

## 2018-04-15 DIAGNOSIS — Z794 Long term (current) use of insulin: Secondary | ICD-10-CM

## 2018-04-15 LAB — CBC WITH DIFFERENTIAL/PLATELET
BASOS PCT: 0 %
Basophils Absolute: 0 10*3/uL (ref 0–0.1)
Eosinophils Absolute: 0 10*3/uL (ref 0–0.7)
Eosinophils Relative: 0 %
HCT: 48 % (ref 40.0–52.0)
Hemoglobin: 15.7 g/dL (ref 13.0–18.0)
Lymphocytes Relative: 9 %
Lymphs Abs: 2.4 10*3/uL (ref 1.0–3.6)
MCH: 29 pg (ref 26.0–34.0)
MCHC: 32.8 g/dL (ref 32.0–36.0)
MCV: 88.5 fL (ref 80.0–100.0)
MONO ABS: 2.1 10*3/uL — AB (ref 0.2–1.0)
Monocytes Relative: 8 %
NEUTROS ABS: 22.1 10*3/uL — AB (ref 1.4–6.5)
Neutrophils Relative %: 83 %
PLATELETS: 352 10*3/uL (ref 150–440)
RBC: 5.42 MIL/uL (ref 4.40–5.90)
RDW: 13.6 % (ref 11.5–14.5)
WBC: 26.6 10*3/uL — ABNORMAL HIGH (ref 3.8–10.6)

## 2018-04-15 LAB — GLUCOSE, CAPILLARY
GLUCOSE-CAPILLARY: 513 mg/dL — AB (ref 70–99)
GLUCOSE-CAPILLARY: 548 mg/dL — AB (ref 70–99)

## 2018-04-15 LAB — URINALYSIS, COMPLETE (UACMP) WITH MICROSCOPIC
BACTERIA UA: NONE SEEN
BILIRUBIN URINE: NEGATIVE
Glucose, UA: >=500 mg/dL — AB
KETONES UR: 80 mg/dL — AB
LEUKOCYTES UA: NEGATIVE
NITRITE: NEGATIVE
Protein, ur: NEGATIVE mg/dL
SQUAMOUS EPITHELIAL / LPF: NONE SEEN (ref 0–5)
Specific Gravity, Urine: 1.02 (ref 1.005–1.030)
pH: 5 (ref 5.0–8.0)

## 2018-04-15 LAB — LIPASE, BLOOD: LIPASE: 17 U/L (ref 11–51)

## 2018-04-15 MED ORDER — SODIUM CHLORIDE 0.9 % IV SOLN
INTRAVENOUS | Status: DC
  Administered 2018-04-15: 4.9 [IU]/h via INTRAVENOUS
  Filled 2018-04-15: qty 1

## 2018-04-15 MED ORDER — ONDANSETRON HCL 4 MG/2ML IJ SOLN
4.0000 mg | Freq: Once | INTRAMUSCULAR | Status: AC
Start: 2018-04-15 — End: 2018-04-15
  Administered 2018-04-15: 4 mg via INTRAVENOUS
  Filled 2018-04-15: qty 2

## 2018-04-15 MED ORDER — DEXTROSE-NACL 5-0.45 % IV SOLN
INTRAVENOUS | Status: DC
Start: 2018-04-15 — End: 2018-04-16

## 2018-04-15 MED ORDER — ALBUTEROL SULFATE (2.5 MG/3ML) 0.083% IN NEBU
2.5000 mg | INHALATION_SOLUTION | Freq: Once | RESPIRATORY_TRACT | Status: AC
  Administered 2018-04-15: 2.5 mg via RESPIRATORY_TRACT
  Filled 2018-04-15: qty 3

## 2018-04-15 MED ORDER — SODIUM CHLORIDE 0.9 % IV BOLUS
1000.0000 mL | Freq: Once | INTRAVENOUS | Status: AC
  Administered 2018-04-15: 1000 mL via INTRAVENOUS

## 2018-04-15 MED ORDER — SODIUM CHLORIDE 0.9 % IV SOLN
1.0000 g | Freq: Once | INTRAVENOUS | Status: AC
  Administered 2018-04-16: 1 g via INTRAVENOUS
  Filled 2018-04-15: qty 10

## 2018-04-15 NOTE — ED Provider Notes (Signed)
[facility] Emergency Department Provider Note ___________________________________________   First MD Initiated Contact with Patient 04/15/18 2207     (approximate)  I have reviewed the triage vital signs and the nursing notes.   HISTORY  Chief Complaint Hyperglycemia  HPI Johnny [name] is a 22 y.o. male with a history of autism as well as diabetes who was presented to the emergency department with vomiting as well as elevated urine ketones.  Glucose found to be greater than 500 at home.  Mother called ambulance and patient transferred to the emergency department.  Due to patient's autism he is not able to give a detailed history.  However, he states that his mouth is dry.  Past Medical History:  Diagnosis Date  . ADHD (attention deficit hyperactivity disorder)   . Allergic rhinitis   . Autism   . Diabetes mellitus without complication [facility](HCC)     Patient Active Problem List   Diagnosis Date Noted  . Chronic allergic rhinitis 03/26/2018  . Uncontrolled type 1 diabetes mellitus with hyperglycemia (HCC) 03/26/2018  . Acne vulgaris 09/22/2017  . Diabetic peripheral neuropathy associated with type 1 diabetes mellitus (HCC) 07/24/2017  . Mental retardation 12/21/2015  . Dehydration 04/14/2014  . Hypoglycemia associated with diabetes (HCC) 10/23/2013  . ADHD (attention deficit hyperactivity disorder) 08/24/2013  . Autistic disorder, active 07/04/2013  . Adjustment reaction 07/03/2013  . Goiter 07/03/2013  . Type I diabetes mellitus with complication, uncontrolled (HCC) 06/26/2013    Past Surgical History:  Procedure Laterality Date  . DENTAL REHABILITATION      Prior to Admission medications   Medication Sig Start Date End Date Taking? Authorizing Provider  ACCU-CHEK FASTCLIX LANCETS MISC CHECK BLOOD SUGAR 6 TIMES DAILY 02/29/16   Verneda SkillHacker, Caroline T, FNP  CLARAVIS 40 MG capsule TK ONE C PO D 09/30/17   [provider]  clindamycin-benzoyl  peroxide (BENZACLIN) gel Apply 1 application topically at bedtime.    [provider]  diphenhydrAMINE (BENADRYL) 25 MG tablet Take 50 mg by mouth every 6 (six) hours as needed for itching or allergies.    [provider]  doxycycline (VIBRA-TABS) 100 MG tablet Take 100 mg by mouth daily. Continuous for acne    [provider]  glucagon (GLUCAGEN HYPOKIT) 1 MG SOLR injection Inject 1mg  IM for extreme hypoglycemia. 11/25/17   Dilan Novosad StallBrennan, Michael J, MD  hydrOXYzine (ATARAX/VISTARIL) 25 MG tablet Take 50 mg by mouth at bedtime as needed for anxiety (sleep). Reported on 12/21/2015    [provider]  insulin aspart (NOVOLOG FLEXPEN) 100 UNIT/ML FlexPen Use up to 50 units daily in the event pump fails 03/10/18   Laveyah Oriol StallBrennan, Michael J, MD  Insulin Glargine (LANTUS SOLOSTAR) 100 UNIT/ML Solostar Pen Use up to 50 units daily in the event pump fails 03/10/18   Anthonymichael Munday StallBrennan, Michael J, MD  NOVOLOG 100 UNIT/ML injection ADMINISTER 200 UNITS VIA INSULIN PUMP EVERY 48 HOURS 01/30/18   Cruzito Standre StallBrennan, Michael J, MD  tretinoin (RETIN-A) 0.025 % cream Apply topically at bedtime. Apply sparingly dor acne. May start twice a week and then increase to daily in 1-2 weeks 06/29/13   Payton Emeraldawluk, Kaitlin, MD  Urine Glucose-Ketones Test STRP If urine output and extreme thirst increases, collect urine and check with strip as directed. Disp 1 vial 06/30/13   Rawluk, Waldo LaineKaitlin, MD    Allergies Patient has no known allergies.  Family History  Problem Relation Age of Onset  . Hypothyroidism Mother   . Kidney disease Mother  Mom unsure what her kidney issues are  . Autism Sister   . Mental retardation Sister   . Cancer Maternal Grandfather   . Diabetes Paternal Grandmother        Died at age 56.  . Other Other        Paternal great aunt with "hypoglycemia" unsure why    Social History Social History   Tobacco Use  . Smoking status: Never Smoker  . Smokeless tobacco: Never Used  Substance Use Topics  .  Alcohol use: No  . Drug use: Not on file    Review of Systems  Constitutional: No fever/chills Eyes: No visual changes. ENT: No sore throat. Cardiovascular: Denies chest pain. Respiratory: Denies shortness of breath. Gastrointestinal: No abdominal pain. Genitourinary: Negative for dysuria. Musculoskeletal: Negative for back pain. Skin: Negative for rash.  Able to asked the patient several yes or no questions however, the history is confounded by his autism.  ____________________________________________   PHYSICAL EXAM:  VITAL SIGNS: ED Triage Vitals  Enc Vitals Group     BP 04/15/18 2219 133/66     Pulse Rate 04/15/18 2219 (!) 123     Resp 04/15/18 2219 18     Temp 04/15/18 2219 97.9 F (36.6 C)     Temp Source 04/15/18 2219 Oral     SpO2 04/15/18 2213 99 %     Weight 04/15/18 2222 145 lb (65.8 kg)     Height 04/15/18 2222 5\' 7"  (1.702 m)     Head Circumference --      Peak Flow --      Pain Score 04/15/18 2219 0     Pain Loc --      Pain Edu? --      Excl. in GC? --     Constitutional: Alert and in no acute distress. Eyes: Conjunctivae are normal.  Head: Atraumatic. Nose: No congestion/rhinnorhea. Mouth/Throat: Mucous membranes are dry Neck: No stridor.   Cardiovascular: Tachycardic, regular rhythm. Grossly normal heart sounds.   Respiratory: Normal respiratory effort.  No retractions. Lungs CTAB. Gastrointestinal: Soft and nontender. No distention.  Musculoskeletal: No lower extremity tenderness nor edema.  No joint effusions. Neurologic:  No gross focal neurologic deficits are appreciated. Skin:  Skin is warm, dry and intact. No rash noted. Psychiatric: Mood and affect are normal. Speech and behavior are normal.  ____________________________________________   LABS (all labs ordered are listed, but only abnormal results are displayed)  Labs Reviewed  GLUCOSE, CAPILLARY - Abnormal; Notable for the following components:      Result Value    Glucose-Capillary 513 (*)    All other components within normal limits  CBC WITH DIFFERENTIAL/PLATELET - Abnormal; Notable for the following components:   WBC 26.6 (*)    Neutro Abs 22.1 (*)    Monocytes Absolute 2.1 (*)    All other components within normal limits  COMPREHENSIVE METABOLIC PANEL - Abnormal; Notable for the following components:   Potassium 6.5 (*)    CO2 9 (*)    Glucose, Bld 571 (*)    BUN 29 (*)    Creatinine, Ser 1.49 (*)    Total Protein 8.4 (*)    Total Bilirubin 1.7 (*)    Anion gap 25 (*)    All other components within normal limits  URINALYSIS, COMPLETE (UACMP) WITH MICROSCOPIC - Abnormal; Notable for the following components:   Color, Urine STRAW (*)    APPearance CLEAR (*)    Glucose, UA >=500 (*)    Hgb  urine dipstick SMALL (*)    Ketones, ur 80 (*)    All other components within normal limits  GLUCOSE, CAPILLARY - Abnormal; Notable for the following components:   Glucose-Capillary 548 (*)    All other components within normal limits  LIPASE, BLOOD  BETA-HYDROXYBUTYRIC ACID  BLOOD GAS, VENOUS   ____________________________________________  EKG  ED ECG REPORT I, Arelia Longest, the attending physician, personally viewed and interpreted this ECG.   Date: 04/15/2018  EKG Time: 2320  Rate: 121  Rhythm: sinus tachycardia  Axis: Normal  Intervals:nonspecific intraventricular conduction delay  ST&T Change: No ST segment elevation or depression.  However there is T wave peaking consistent with hyperkalemia.  Also with mild intraventricular conduction delay.  ____________________________________________  RADIOLOGY   ____________________________________________   PROCEDURES  Procedure(s) performed:   .Critical Care Performed by: Myrna Blazer, MD Authorized by: Myrna Blazer, MD   Critical care provider statement:    Critical care time (minutes):  35   Critical care time was exclusive of:  Separately billable  procedures and treating other patients   Critical care was necessary to treat or prevent imminent or life-threatening deterioration of the following conditions:  Metabolic crisis   Critical care was time spent personally by me on the following activities:  Development of treatment plan with patient or surrogate, discussions with consultants, evaluation of patient's response to treatment, examination of patient, obtaining history from patient or surrogate, ordering and performing treatments and interventions, ordering and review of laboratory studies, ordering and review of radiographic studies, pulse oximetry, re-evaluation of patient's condition and review of old charts    Critical Care performed:   ____________________________________________   INITIAL IMPRESSION / ASSESSMENT AND PLAN / ED COURSE  Pertinent labs & imaging results that were available during my care of the patient were reviewed by me and considered in my medical decision making (see chart for details).  DDX: DKA, hyperglycemia, nausea vomiting, gastroenteritis, electrolyte abnormality, hyperkalemia, acute renal failure As part of my medical decision making, I reviewed the following data within the electronic MEDICAL RECORD NUMBER Notes from prior ED visits  ----------------------------------------- 11:45 PM on 04/15/2018 -----------------------------------------  Patient found to be in DKA with elevated potassium and renal insufficiency.  IV fluids ordered.  Patient also ordered insulin as well as albuterol and calcium for cardiac membrane stabilization.  Signed out to Dr. Anne Hahn. ____________________________________________   FINAL CLINICAL IMPRESSION(S) / ED DIAGNOSES  DKA.  Hyperkalemia.  Renal insufficiency.    NEW MEDICATIONS STARTED DURING THIS VISIT:  New Prescriptions   No medications on file     Note:  This document was prepared using Dragon voice recognition software and may include unintentional  dictation errors.     Myrna Blazer, MD 04/15/18 [phone]

## 2018-04-15 NOTE — ED Notes (Signed)
EDP notified about potassium 6.5 and CBG 571.

## 2018-04-15 NOTE — ED Triage Notes (Addendum)
Pt BIB [facility] from home for hyperglycemia. Per EMS initial glucose reading at home was 547. Pt is on an insulin pump - L deltoid/stomach. Pt endorses N/V and back pain as well as frequent urination.  Pt has hx autism & DM. EMS inserted PIV and gave of NS. Pt is alert. ABCs intact. NAD.

## 2018-04-16 ENCOUNTER — Inpatient Hospital Stay
Admit: 2018-04-16 | Discharge: 2018-04-16 | Disposition: A | Discharge status: Home / Self Care | Payer: Medicaid Other | Attending: Pulmonary Disease | Admitting: Pulmonary Disease

## 2018-04-16 ENCOUNTER — Inpatient Hospital Stay: Payer: Medicaid Other

## 2018-04-16 DIAGNOSIS — Z833 Family history of diabetes mellitus: Secondary | ICD-10-CM | POA: Diagnosis not present

## 2018-04-16 DIAGNOSIS — F909 Attention-deficit hyperactivity disorder, unspecified type: Secondary | ICD-10-CM | POA: Diagnosis present

## 2018-04-16 DIAGNOSIS — F840 Autistic disorder: Secondary | ICD-10-CM | POA: Diagnosis present

## 2018-04-16 DIAGNOSIS — E875 Hyperkalemia: Secondary | ICD-10-CM | POA: Diagnosis not present

## 2018-04-16 DIAGNOSIS — E1042 Type 1 diabetes mellitus with diabetic polyneuropathy: Secondary | ICD-10-CM | POA: Diagnosis present

## 2018-04-16 DIAGNOSIS — Z794 Long term (current) use of insulin: Secondary | ICD-10-CM | POA: Diagnosis not present

## 2018-04-16 DIAGNOSIS — Z810 Family history of intellectual disabilities: Secondary | ICD-10-CM | POA: Diagnosis not present

## 2018-04-16 DIAGNOSIS — Z79899 Other long term (current) drug therapy: Secondary | ICD-10-CM | POA: Diagnosis not present

## 2018-04-16 DIAGNOSIS — E1010 Type 1 diabetes mellitus with ketoacidosis without coma: Secondary | ICD-10-CM | POA: Diagnosis not present

## 2018-04-16 DIAGNOSIS — N179 Acute kidney failure, unspecified: Secondary | ICD-10-CM | POA: Diagnosis present

## 2018-04-16 LAB — CBC
HEMATOCRIT: 43.3 % (ref 40.0–52.0)
HEMOGLOBIN: 14.5 g/dL (ref 13.0–18.0)
MCH: 29 pg (ref 26.0–34.0)
MCHC: 33.5 g/dL (ref 32.0–36.0)
MCV: 86.4 fL (ref 80.0–100.0)
Platelets: 309 10*3/uL (ref 150–440)
RBC: 5.02 MIL/uL (ref 4.40–5.90)
RDW: 13.3 % (ref 11.5–14.5)
WBC: 23.3 10*3/uL — AB (ref 3.8–10.6)

## 2018-04-16 LAB — GLUCOSE, CAPILLARY
GLUCOSE-CAPILLARY: 420 mg/dL — AB (ref 70–99)
GLUCOSE-CAPILLARY: 315 mg/dL — AB (ref 70–99)
GLUCOSE-CAPILLARY: 200 mg/dL — AB (ref 70–99)
GLUCOSE-CAPILLARY: 219 mg/dL — AB (ref 70–99)
GLUCOSE-CAPILLARY: 190 mg/dL — AB (ref 70–99)
GLUCOSE-CAPILLARY: 161 mg/dL — AB (ref 70–99)
GLUCOSE-CAPILLARY: 188 mg/dL — AB (ref 70–99)
GLUCOSE-CAPILLARY: 205 mg/dL — AB (ref 70–99)
GLUCOSE-CAPILLARY: 150 mg/dL — AB (ref 70–99)
Glucose-Capillary: 365 mg/dL — ABNORMAL HIGH (ref 70–99)
Glucose-Capillary: 251 mg/dL — ABNORMAL HIGH (ref 70–99)
Glucose-Capillary: 124 mg/dL — ABNORMAL HIGH (ref 70–99)
Glucose-Capillary: 101 mg/dL — ABNORMAL HIGH (ref 70–99)
Glucose-Capillary: 96 mg/dL (ref 70–99)
Glucose-Capillary: 132 mg/dL — ABNORMAL HIGH (ref 70–99)
Glucose-Capillary: 355 mg/dL — ABNORMAL HIGH (ref 70–99)
Glucose-Capillary: 298 mg/dL — ABNORMAL HIGH (ref 70–99)

## 2018-04-16 LAB — BASIC METABOLIC PANEL
ANION GAP: 9 (ref 5–15)
ANION GAP: 8 (ref 5–15)
ANION GAP: 12 (ref 5–15)
Anion gap: 14 (ref 5–15)
Anion gap: 7 (ref 5–15)
BUN: 27 mg/dL — ABNORMAL HIGH (ref 6–20)
BUN: 23 mg/dL — ABNORMAL HIGH (ref 6–20)
BUN: 19 mg/dL (ref 6–20)
BUN: 19 mg/dL (ref 6–20)
BUN: 17 mg/dL (ref 6–20)
CALCIUM: 8.6 mg/dL — AB (ref 8.9–10.3)
CALCIUM: 8.1 mg/dL — AB (ref 8.9–10.3)
CALCIUM: 8.1 mg/dL — AB (ref 8.9–10.3)
CALCIUM: 8.1 mg/dL — AB (ref 8.9–10.3)
CHLORIDE: 108 mmol/L (ref 98–111)
CHLORIDE: 112 mmol/L — AB (ref 98–111)
CHLORIDE: 110 mmol/L (ref 98–111)
CHLORIDE: 106 mmol/L (ref 98–111)
CHLORIDE: 104 mmol/L (ref 98–111)
CO2: 13 mmol/L — ABNORMAL LOW (ref 22–32)
CO2: 16 mmol/L — ABNORMAL LOW (ref 22–32)
CO2: 18 mmol/L — ABNORMAL LOW (ref 22–32)
CO2: 17 mmol/L — AB (ref 22–32)
CO2: 21 mmol/L — AB (ref 22–32)
CREATININE: 1.25 mg/dL — AB (ref 0.61–1.24)
CREATININE: 1.06 mg/dL (ref 0.61–1.24)
CREATININE: 0.95 mg/dL (ref 0.61–1.24)
Calcium: 8.1 mg/dL — ABNORMAL LOW (ref 8.9–10.3)
Creatinine, Ser: 0.89 mg/dL (ref 0.61–1.24)
Creatinine, Ser: 1.06 mg/dL (ref 0.61–1.24)
GFR calc non Af Amer: >60 mL/min (ref >60)
GFR calc non Af Amer: >60 mL/min (ref >60)
GFR calc non Af Amer: >60 mL/min (ref >60)
GFR calc non Af Amer: >60 mL/min (ref >60)
GFR calc non Af Amer: >60 mL/min (ref >60)
GLUCOSE: 314 mg/dL — AB (ref 70–99)
GLUCOSE: 219 mg/dL — AB (ref 70–99)
GLUCOSE: 130 mg/dL — AB (ref 70–99)
GLUCOSE: 198 mg/dL — AB (ref 70–99)
Glucose, Bld: 287 mg/dL — ABNORMAL HIGH (ref 70–99)
Potassium: 4.6 mmol/L (ref 3.5–5.1)
Potassium: 4.4 mmol/L (ref 3.5–5.1)
Potassium: 4 mmol/L (ref 3.5–5.1)
Potassium: 4.1 mmol/L (ref 3.5–5.1)
Potassium: 3.7 mmol/L (ref 3.5–5.1)
Sodium: 135 mmol/L (ref 135–145)
Sodium: 137 mmol/L (ref 135–145)
Sodium: 136 mmol/L (ref 135–145)
Sodium: 135 mmol/L (ref 135–145)
Sodium: 132 mmol/L — ABNORMAL LOW (ref 135–145)

## 2018-04-16 LAB — BLOOD GAS, VENOUS
ACID-BASE DEFICIT: 21 mmol/L — AB (ref 0.0–2.0)
BICARBONATE: 7.8 mmol/L — AB (ref 20.0–28.0)
O2 SAT: 80.4 %
Patient temperature: 37
pCO2, Ven: 27 mmHg — ABNORMAL LOW (ref 44.0–60.0)
pH, Ven: 7.07 — CL (ref 7.250–7.430)
pO2, Ven: 64 mmHg — ABNORMAL HIGH (ref 32.0–45.0)

## 2018-04-16 LAB — MRSA PCR SCREENING: MRSA BY PCR: NEGATIVE

## 2018-04-16 LAB — COMPREHENSIVE METABOLIC PANEL
ALT: 28 U/L (ref 0–44)
ANION GAP: 25 — AB (ref 5–15)
AST: 32 U/L (ref 15–41)
Albumin: 4.7 g/dL (ref 3.5–5.0)
Alkaline Phosphatase: 119 U/L (ref 38–126)
BUN: 29 mg/dL — ABNORMAL HIGH (ref 6–20)
CHLORIDE: 99 mmol/L (ref 98–111)
CO2: 9 mmol/L — AB (ref 22–32)
Calcium: 9 mg/dL (ref 8.9–10.3)
Creatinine, Ser: 1.49 mg/dL — ABNORMAL HIGH (ref 0.61–1.24)
GFR calc non Af Amer: >60 mL/min (ref >60)
Glucose, Bld: 571 mg/dL (ref 70–99)
POTASSIUM: 6.5 mmol/L — AB (ref 3.5–5.1)
SODIUM: 133 mmol/L — AB (ref 135–145)
Total Bilirubin: 1.7 mg/dL — ABNORMAL HIGH (ref 0.3–1.2)
Total Protein: 8.4 g/dL — ABNORMAL HIGH (ref 6.5–8.1)

## 2018-04-16 LAB — TSH: TSH: 2.081 u[IU]/mL (ref 0.350–4.500)

## 2018-04-16 LAB — BETA-HYDROXYBUTYRIC ACID: Beta-Hydroxybutyric Acid: 7.05 mmol/L — ABNORMAL HIGH (ref 0.05–0.27)

## 2018-04-16 MED ORDER — DEXTROSE-NACL 5-0.45 % IV SOLN
INTRAVENOUS | Status: DC
  Administered 2018-04-16 (×2): via INTRAVENOUS

## 2018-04-16 MED ORDER — INFLUENZA VAC SPLIT QUAD 0.5 ML IM SUSY: 0.5000 mL | PREFILLED_SYRINGE | INTRAMUSCULAR | Status: DC

## 2018-04-16 MED ORDER — SODIUM CHLORIDE 0.45 % IV SOLN
INTRAVENOUS | Status: DC
  Administered 2018-04-16: 09:00:00 via INTRAVENOUS

## 2018-04-16 MED ORDER — INSULIN PUMP
Freq: Three times a day (TID) | SUBCUTANEOUS | Status: DC
  Administered 2018-04-16: 3.3 via SUBCUTANEOUS
  Administered 2018-04-16: 0.6 via SUBCUTANEOUS
  Administered 2018-04-17 – 2018-04-18 (×3): via SUBCUTANEOUS
  Filled 2018-04-16: qty 1

## 2018-04-16 MED ORDER — INSULIN ASPART 100 UNIT/ML ~~LOC~~ SOLN
SUBCUTANEOUS | Status: AC
  Filled 2018-04-16: qty 1

## 2018-04-16 MED ORDER — SODIUM CHLORIDE 0.9 % IV SOLN: INTRAVENOUS | Status: DC

## 2018-04-16 MED ORDER — SODIUM CHLORIDE 0.9 % IV SOLN
INTRAVENOUS | Status: DC
  Administered 2018-04-16 – 2018-04-17 (×3): via INTRAVENOUS

## 2018-04-16 MED ORDER — ENOXAPARIN SODIUM 40 MG/0.4ML ~~LOC~~ SOLN
40.0000 mg | SUBCUTANEOUS | Status: DC
  Administered 2018-04-17 – 2018-04-18 (×2): 40 mg via SUBCUTANEOUS
  Filled 2018-04-16 (×2): qty 0.4

## 2018-04-16 MED ORDER — INSULIN ASPART 100 UNIT/ML ~~LOC~~ SOLN: 0.0000 [IU] | Freq: Every day | SUBCUTANEOUS | Status: DC

## 2018-04-16 MED ORDER — SODIUM CHLORIDE 0.9 % IV BOLUS
1000.0000 mL | Freq: Once | INTRAVENOUS | Status: AC
  Administered 2018-04-16: 1000 mL via INTRAVENOUS

## 2018-04-16 MED ORDER — SODIUM CHLORIDE 0.9 % IV SOLN
INTRAVENOUS | Status: DC
  Administered 2018-04-16: 150 mL/h via INTRAVENOUS

## 2018-04-16 MED ORDER — SODIUM CHLORIDE 0.9 % IV SOLN
INTRAVENOUS | Status: AC
  Administered 2018-04-16: 999 mL/h via INTRAVENOUS

## 2018-04-16 MED ORDER — ENOXAPARIN SODIUM 40 MG/0.4ML ~~LOC~~ SOLN
40.0000 mg | SUBCUTANEOUS | Status: DC
  Administered 2018-04-16: 40 mg via SUBCUTANEOUS
  Filled 2018-04-16: qty 0.4

## 2018-04-16 MED ORDER — SODIUM CHLORIDE 0.45 % IV SOLN
INTRAVENOUS | Status: DC
  Administered 2018-04-16: 19:00:00 via INTRAVENOUS

## 2018-04-16 MED ORDER — INSULIN ASPART 100 UNIT/ML ~~LOC~~ SOLN
0.0000 [IU] | Freq: Three times a day (TID) | SUBCUTANEOUS | Status: DC
  Administered 2018-04-16: 15 [IU] via SUBCUTANEOUS

## 2018-04-16 NOTE — Progress Notes (Addendum)
Inpatient Diabetes Program Recommendations  AACE/ADA: New Consensus Statement on Inpatient Glycemic Control (2019)  Target Ranges:  Prepandial:   less than 140 mg/dL      Peak postprandial:   less than 180 mg/dL (1-2 hours)      Critically ill patients:  140 - 180 mg/dL  Results for Johnny MunroeMICCO, Jostin (MRN [id number]) as of 04/16/2018 07:54  Ref. Range 04/16/2018 01:38 04/16/2018 02:26 04/16/2018 03:37 04/16/2018 04:36 04/16/2018 05:39 04/16/2018 06:37 04/16/2018 07:31  Glucose-Capillary Latest Ref Range: 70 - 99 mg/dL 846420 (H) 962365 (H) 952315 (H) 251 (H) 200 (H) 219 (H) 190 (H)   Results for Johnny MunroeMICCO, Keath (MRN [id number]) as of 04/16/2018 07:54  Ref. Range 04/15/2018 22:25  Beta-Hydroxybutyric Acid Latest Ref Range: 0.05 - 0.27 mmol/L 7.05 (H)  Glucose Latest Ref Range: 70 - 99 mg/dL [id number] [facility])  Results for Johnny MunroeMICCO, Kolston (MRN [id number]) as of 04/16/2018 07:54  Ref. Range 04/15/2018 22:25 04/16/2018 03:29 04/16/2018 06:12  CO2 Latest Ref Range: 22 - 32 mmol/L 9 (L) 13 (L) 16 (L)  Results for Johnny MunroeMICCO, Kolbi (MRN [id number]) as of 04/16/2018 07:54  Ref. Range 04/15/2018 22:25 04/16/2018 03:29 04/16/2018 06:12  Anion gap Latest Ref Range: 5 - 15  25 (H) 14 9   Review of Glycemic Control  Diabetes history: DM1 (makes NO insulin; requires basal, correction, and carbohydrate coverage) Outpatient Diabetes medications: Novolog in OmniPod insulin pump with Dexcom G6 CGM Current orders for Inpatient glycemic control: IV insulin drip per DKA  NOTE: In reviewing chart, noted patient has Type 1 DM and is followed by Dr. Fransico MichaelBrennan (Endocrinologist). Also noted history of MR, ADHD, and Ausistic disorder. Patient uses an OmniPod insulin pump with Novolog insulin and Dexcom G6 continuous glucose monitor. Per home medication list, patient has Lantus listed which would be used if patient is not wearing insulin pump.  Per telephone encounter by Dr. Fransico MichaelBrennan on 03/09/18 and office note on 12/03/17 the following should be insulin pump  settings:  New basal rates: MN: 1.35 4 AM: 1.60 8 AM: 1.15 Noon: 0.90 06 PM: 1.15  Continue ISF: 50  Continue BG targets: MN: 180 6 AM: 150 9 PM: 180  Continue ICRs: MN: 15: 6 AM:12 Noon: 15  Per labs and chart review, CO2 16 and AG 9 this morning at 6:12 am and glucose is improving. Insulin drip should be continued until acidosis is cleared as determined by MD. If MD will plan for patient to resume his insulin pump, patient will need to have new insulin pump pod to insert and use for transitioning from IV to insulin pump.  Will plan to talk with patient and his mother today.  Addendum 04/16/18@11 :00-Spoke with patient and his mother regarding DM and DKA. Patient lying in bed watching cartoons and shook head to indicate "yes" when asked if he feels better and gave thumps up when asked how he was doing. Patient's mother reports that she does everything for patient with his insulin pump. She confirms that patient sees Dr. Fransico MichaelBrennan and pump changes were made 4-5 weeks ago per Dr. Fransico MichaelBrennan.  She reports patient's glucose was over 300 mg/dl on Tuesday morning before school and patient ate breakfast and went to school. The school called her reporting that patient was sick and vomiting so she picked him up from school. She reports that his glucose was still over 300 mg/dl when she got him home around 12:30 pm and she done a correction using insulin pump. She reports that she continued to check his  glucose and do corrections with the pump but glucose did not improve. She states that she placed a new OmniPod on patient around 8:45 Tuesday evening and decided to bring him to the hospital. She noted that when she removed the previous pod she is fairly certain that the insulin canula was lying on top of the skin and not inserted.  Requested to view insulin pump settings and when patient's mother provided personal data device for the OmniPod it was noted to be on and showing insulin basal rate still being  delivered. Questioned patient's mother if OnmiPod was currently on and she stated "No". Explained that OmniPod appears to be On and delivering basal insulin (hourly rate). Patient's mother then stated yes it was on and she had forgotten that the pump was delivering an hourly rate.  Had patient's mother suspend the OmniPod and recommended she remove the current OmniPod on patient's upper left arm.  Patient's mother does not have another insulin pump pod with her at the hospital but she will call her husband to bring one. Discussed DKA process and our protocol for DKA treatment and transition back to insulin pump when appropriate. Discussed hyperglycemia and explained to patient's mother then when she has done corrections on the insulin pump and the glucose is not coming down or continuing to increase, it is very possible there could be an issue with the insulin pump pod and that should be changed out then in case the pod is not working properly or not delivering insulin because the infusion cannula is not inserted correctly. Patient's mother verbalized understanding of information discussed and states that she will have her husband bring a new OmniPod so that patient can transition back to his insulin pump once the pod is brought to the hospital. Discussed situation with Vinetta Bergamo, RN and she will call once pod is brought in by patient's father.  Thanks, Orlando Penner, RN, MSN, CDE Diabetes Coordinator Inpatient Diabetes Program [phone] (Team Pager from 8am to 5pm)

## 2018-04-16 NOTE — Progress Notes (Signed)
Patients blood sugar is slowly going up. Will cover with sliding scale.  Will continue 1/2 NS at 75 Will recheck BMP at 8 Worry about insulin pump not working properly and patient going back in DKA Plan discussed with nursing  Roseanne Reno Pulmonary Critical Care [facility] Sleep Medicine

## 2018-04-16 NOTE — Progress Notes (Signed)
[employer] [facility] [facility] [place] [facility]   PATIENT NAME: Johnny [name]    MR#:  [id number]  DATE OF BIRTH:  26-Apr-1996  SUBJECTIVE:  CHIEF COMPLAINT:   Chief Complaint  Patient presents with  . Hyperglycemia   Came with DKA. Still had Bicarb low in morning, was on Insuline drip.  REVIEW OF SYSTEMS:   Can not give ROS as have Autism.  ROS  DRUG ALLERGIES:  No Known Allergies  VITALS:  Blood pressure (!) 119/59, pulse (!) 108, temperature 98.9 F (37.2 C), temperature source Oral, resp. rate 16, height 5\' 7"  (1.702 m), weight 67.8 kg, SpO2 100 %.  PHYSICAL EXAMINATION:  GENERAL:  22 y.o.-year-old patient lying in the bed with no acute distress.  EYES: Pupils equal, round, reactive to light and accommodation. No scleral icterus. Extraocular muscles intact.  HEENT: Head atraumatic, normocephalic. Oropharynx and nasopharynx clear.  NECK:  Supple, no jugular venous distention. No thyroid enlargement, no tenderness.  LUNGS: Normal breath sounds bilaterally, no wheezing, rales,rhonchi or crepitation. No use of accessory muscles of respiration.  CARDIOVASCULAR: S1, S2 normal. No murmurs, rubs, or gallops.  ABDOMEN: Soft, nontender, nondistended. Bowel sounds present. No organomegaly or mass.  EXTREMITIES: No pedal edema, cyanosis, or clubbing.  NEUROLOGIC: Cranial nerves II through XII are intact. Muscle strength 5/5 in all extremities. Sensation intact. Gait not checked.  PSYCHIATRIC: The patient is alert and oriented x 0.  SKIN: No obvious rash, lesion, or ulcer.   Physical Exam LABORATORY PANEL:   CBC Recent Labs  Lab 04/16/18 0329  WBC 23.3*  HGB 14.5  HCT 43.3  PLT 309   ------------------------------------------------------------------------------------------------------------------  Chemistries  Recent Labs  Lab 04/15/18 2225  04/16/18 1947  NA 133*   < > 132*  K 6.5*   < > 3.7  CL 99   < > 104  CO2 9*   < > 21*  GLUCOSE 571*   < > 287*  BUN  29*   < > 17  CREATININE 1.49*   < > 0.95  CALCIUM 9.0   < > 8.1*  AST 32  --   --   ALT 28  --   --   ALKPHOS 119  --   --   BILITOT 1.7*  --   --    < > = values in this interval not displayed.   ------------------------------------------------------------------------------------------------------------------  Cardiac Enzymes No results for input(s): TROPONINI in the last 168 hours. ------------------------------------------------------------------------------------------------------------------  RADIOLOGY:  Dg Chest Port 1 View  Result Date: 04/16/2018 CLINICAL DATA:  Leukocytosis. EXAM: PORTABLE CHEST 1 VIEW COMPARISON:  10/15/2017. FINDINGS: Interval enlargement of the cardiac silhouette. The lungs remain clear with normal vascularity. Unremarkable bones. IMPRESSION: Interval cardiomegaly, magnified by the portable AP technique. The increase in size could be due to an interval pericardial effusion. Otherwise, normal examination. Electronically Signed   By: Beckie Salts M.D.   On: 04/16/2018 10:47    ASSESSMENT AND PLAN:   Principal Problem:   DKA, type 1 (HCC) Active Problems:   Autistic disorder, active   Hyperkalemia  *  DKA, type 1 (HCC) -IV insulin, aggressive IV fluids, admit to ICU, serial metabolic panels Manage per ICU tem. *  Hyperkalemia -IV insulin above should treat, serial metabolic panels as above *  Autistic disorder, active      All the records are reviewed and case discussed with Care Management/Social Workerr. Management plans discussed with the patient, family and they are in agreement.  CODE STATUS: Full.  TOTAL TIME TAKING CARE OF THIS PATIENT: 35 minutes.    POSSIBLE D/C IN 1-2 DAYS, DEPENDING ON CLINICAL CONDITION.   Altamese Dilling M.D on 04/16/2018   Between 7am to 6pm - Pager - [phone]  After 6pm go to [email or web address] - password [email or web address]  [facility]  Office  [phone]  CC: Primary care physician;  [facility]  Note: This dictation was prepared with Dragon dictation along with smaller phrase technology. Any transcriptional errors that result from this process are unintentional.

## 2018-04-16 NOTE — Progress Notes (Signed)
   04/16/18 1535  Clinical Encounter Type  Visited With Patient and family together  Visit Type Follow-up  Spiritual Encounters  Spiritual Needs Emotional   Chaplain followed up with patient and family as per earlier conversation.  Patient remained focused on television.  Chaplain engaged parents, discussion of supports and coping styles available to them.  Mother identified focusing on joys in the midst of many concerns.  Family declined prayer at this time, but open to follow up visit tomorrow (Thursday).

## 2018-04-16 NOTE — Progress Notes (Signed)
04/16/18 1345  Clinical Encounter Type  Visited With Patient and family together  Visit Type Initial (order for prayer)  Referral From Nurse  Consult/Referral To Chaplain   Chaplain met with patient and parents.  Patient's focus remained on the television.  Patient's mom reported that patient was tired and that she needed to eat.  Family requested that chaplain return later today.

## 2018-04-16 NOTE — H&P (Signed)
[facility] - [place] at [facility]   PATIENT NAME: Johnny [name]    MR#:  [id number]  DATE OF BIRTH:  August 20, 1995  DATE OF ADMISSION:  04/15/2018  PRIMARY CARE PHYSICIAN: [facility]   REQUESTING/REFERRING PHYSICIAN: Pershing Proud, MD  CHIEF COMPLAINT:   Chief Complaint  Patient presents with  . Hyperglycemia    HISTORY OF PRESENT ILLNESS:  Johnny [name]  is a 22 y.o. male who presents with chief complaint as above.  Patient has autism and is unable to contribute much information to his history.  His mother is at bedside and provides details about the history of present illness.  She states that he came home from school feeling sick, with nausea and some vomiting.  He has a glucometer that attaches to his arm, but she states that it had malfunctioned her come out at school.  She checked his glucose at home and it was over 500, and he had persistent polydipsia.  She brought him to the ED for evaluation and he was found here to be in DKA.  He also had hyperkalemia with peaking of his T waves on EKG.  He was treated with nebulized albuterol, and IV insulin was started and hospitalist were called for admission  PAST MEDICAL HISTORY:   Past Medical History:  Diagnosis Date  . ADHD (attention deficit hyperactivity disorder)   . Allergic rhinitis   . Autism   . Diabetes mellitus without complication (HCC)      PAST SURGICAL HISTORY:   Past Surgical History:  Procedure Laterality Date  . DENTAL REHABILITATION       SOCIAL HISTORY:   Social History   Tobacco Use  . Smoking status: Never Smoker  . Smokeless tobacco: Never Used  Substance Use Topics  . Alcohol use: No     FAMILY HISTORY:   Family History  Problem Relation Age of Onset  . Hypothyroidism Mother   . Kidney disease Mother        Mom unsure what her kidney issues are  . Autism Sister   . Mental retardation Sister   . Cancer Maternal Grandfather   . Diabetes Paternal  Grandmother        Died at age 20.  . Other Other        Paternal great aunt with "hypoglycemia" unsure why     DRUG ALLERGIES:  No Known Allergies  MEDICATIONS AT HOME:   Prior to Admission medications   Medication Sig Start Date End Date Taking? Authorizing Provider  ACCU-CHEK FASTCLIX LANCETS MISC CHECK BLOOD SUGAR 6 TIMES DAILY 02/29/16   Verneda Skill, FNP  CLARAVIS 40 MG capsule Take 40 mg by mouth daily.  09/30/17   [provider]  clindamycin-benzoyl peroxide (BENZACLIN) gel Apply 1 application topically at bedtime.    [provider]  diphenhydrAMINE (BENADRYL) 25 MG tablet Take 50 mg by mouth every 6 (six) hours as needed for itching or allergies.    [provider]  doxycycline (VIBRA-TABS) 100 MG tablet Take 100 mg by mouth daily. Continuous for acne    [provider]  glucagon (GLUCAGEN HYPOKIT) 1 MG SOLR injection Inject 1mg  IM for extreme hypoglycemia. 11/25/17   Chanti Golubski Stall, MD  hydrOXYzine (ATARAX/VISTARIL) 25 MG tablet Take 50 mg by mouth at bedtime as needed for anxiety (sleep). Reported on 12/21/2015    [provider]  insulin aspart (NOVOLOG FLEXPEN) 100 UNIT/ML FlexPen Use up to 50 units daily in the event  pump fails 03/10/18   Timberlynn Kizziah Stall, MD  Insulin Glargine (LANTUS SOLOSTAR) 100 UNIT/ML Solostar Pen Use up to 50 units daily in the event pump fails 03/10/18   Cherie Lasalle Stall, MD  NOVOLOG 100 UNIT/ML injection ADMINISTER 200 UNITS VIA INSULIN PUMP EVERY 48 HOURS 01/30/18   Charlene Detter Stall, MD  tretinoin (RETIN-A) 0.025 % cream Apply topically at bedtime. Apply sparingly dor acne. May start twice a week and then increase to daily in 1-2 weeks 06/29/13   Payton Emerald, MD  Urine Glucose-Ketones Test STRP If urine output and extreme thirst increases, collect urine and check with strip as directed. Disp 1 vial 06/30/13   [place], Waldo Laine, MD    REVIEW OF SYSTEMS:  Review of Systems  Unable to perform ROS:  Psychiatric disorder     VITAL SIGNS:   Vitals:   04/15/18 2222 04/15/18 2239 04/15/18 2300 04/15/18 2330  BP:  118/67 (!) 110/50 (!) 109/55  Pulse:  (!) 120 (!) 112 (!) 121  Resp:  19 16 16   Temp:      TempSrc:      SpO2:  95% 96% 95%  Weight: 65.8 kg     Height: 5\' 7"  (1.702 m)      Wt Readings from Last 3 Encounters:  04/15/18 65.8 kg  03/25/18 70.8 kg  03/18/18 70.4 kg    PHYSICAL EXAMINATION:  Physical Exam  Vitals reviewed. Constitutional: He is oriented to person, place, and time. He appears well-developed and well-nourished. No distress.  HENT:  Head: Normocephalic and atraumatic.  Mouth/Throat: Oropharynx is clear and moist.  Eyes: Pupils are equal, round, and reactive to light. Conjunctivae and EOM are normal. No scleral icterus.  Neck: Normal range of motion. Neck supple. No JVD present. No thyromegaly present.  Cardiovascular: Normal rate, regular rhythm and intact distal pulses. Exam reveals no gallop and no friction rub.  No murmur heard. Respiratory: Effort normal and breath sounds normal. No respiratory distress. He has no wheezes. He has no rales.  GI: Soft. Bowel sounds are normal. He exhibits no distension. There is no tenderness.  Musculoskeletal: Normal range of motion. He exhibits no edema.  No arthritis, no gout  Lymphadenopathy:    He has no cervical adenopathy.  Neurological: He is alert and oriented to person, place, and time. No cranial nerve deficit.  No dysarthria, no aphasia  Skin: Skin is warm and dry. No rash noted. No erythema.  Psychiatric:  Unable to fully assess    LABORATORY PANEL:   CBC Recent Labs  Lab 04/15/18 2225  WBC 26.6*  HGB 15.7  HCT 48.0  PLT 352   ------------------------------------------------------------------------------------------------------------------  Chemistries  Recent Labs  Lab 04/15/18 2225  NA 133*  K 6.5*  CL 99  CO2 9*  GLUCOSE 571*  BUN 29*  CREATININE 1.49*  CALCIUM 9.0  AST 32   ALT 28  ALKPHOS 119  BILITOT 1.7*   ------------------------------------------------------------------------------------------------------------------  Cardiac Enzymes No results for input(s): TROPONINI in the last 168 hours. ------------------------------------------------------------------------------------------------------------------  RADIOLOGY:  No results found.  EKG:   Orders placed or performed during the hospital encounter of 04/15/18  . ED EKG  . ED EKG  . EKG 12-Lead  . EKG 12-Lead    IMPRESSION AND PLAN:  Principal Problem:   DKA, type 1 (HCC) -IV insulin, aggressive IV fluids, admit to ICU, serial metabolic panels Active Problems:   Hyperkalemia -IV insulin above should treat, serial metabolic panels as above   Autistic disorder, active  Chart review performed and case discussed with ED provider. Labs, imaging and/or ECG reviewed by provider and discussed with patient/family. Management plans discussed with the patient and/or family.  DVT PROPHYLAXIS: SubQ lovenox   GI PROPHYLAXIS:  None  ADMISSION STATUS: Inpatient     CODE STATUS: Full Code Status History    Date Active Date Inactive Code Status Order ID Comments User Context   06/26/2013 2157 07/04/2013 1937 Full Code [id number]  Marena Chancy, MD Inpatient      TOTAL CRITICAL CARE TIME TAKING CARE OF THIS PATIENT: 50 minutes.   Anysha Frappier FIELDING 04/16/2018, 12:33 AM  [facility]  Office  [phone]  CC: Primary care physician; [facility]  Note:  This document was prepared using Dragon voice recognition software and may include unintentional dictation errors.

## 2018-04-16 NOTE — Consult Note (Addendum)
Name: Johnny [name] MRN: [id number] DOB: 1996/04/07    ADMISSION DATE:  04/15/2018 CONSULTATION DATE:  04/16/2018  REFERRING MD :  Dr. Anne Hahn  CHIEF COMPLAINT:  Hyperglycemia  BRIEF PATIENT DESCRIPTION:  22 y.o. Male admitted with DKA, AKI, and Hyperkalemia  SIGNIFICANT EVENTS  04/16/18>> Admission to [facility] Stepdown  STUDIES:    HISTORY OF PRESENT ILLNESS:   Johnny [name] is a 22 y.o. Male with a PMH of Autism, ADHD, DM I, and allergic rhinitis who presents to [facility] ED on 04/15/18 with c/o hyperglycemia, vomiting, and elevated urine ketones.  Due to pt's autism he is not able to give a detailed history, therefore history is obtained from pt's mother.  His mother reports that he was not feeling well in the am of 9/24 when she dropped him off at school, and that shortly after was called to come pick him up from school due to vomiting.  She checked his glucose at home and was noted to be greater than 500, and urine serum ketones elevated.  She reports that in the evening of 9/23 he reported to her that he was quite thirsty and having to pee a lot. She reports that he has not had fever or chills, or sputum.  She also reports that she feels his insulin pump may have malfunctioned. In the ED initial workup revealed Glucose 571, CO2 9, Anion gap 25, Na 133, K 6.5, Creatinine 1.49, Beta-hydroxybutyric acid 7.05, WBC 26.6, and UA positive for ketones.  In the ED he received albuterol, IV insulin, and Calcium gluconate for Hyperkalemia, and 1 L NS bolus and was started on insulin drip.  He is admitted to [facility] for treatment of DKA, AKI, and Hyperkalemia.  PCCM is consulted for further management.  Subjective: Patient was seen on 04/16/2018: Doing well, denies any complaint of any pain fever, mother was a bit concerned about rash around left cheek but patient is not complaining of any fever or any pain around it - Anion gap is improved to 8, bicarb is still 16 however chloride is 112 -WBC improved but  still around 23 -Patient was complaining of some throat pain but no evidence of any throat swelling -Patient denies any chest pain or any other complaint - Apparently patient was getting insulin pump on top of insulin drip - Diabetic coordinator is adjusting medications with the patient's family  PAST MEDICAL HISTORY :   has a past medical history of ADHD (attention deficit hyperactivity disorder), Allergic rhinitis, Autism, and Diabetes mellitus without complication (HCC).  has a past surgical history that includes Dental rehabilitation. Prior to Admission medications   Medication Sig Start Date End Date Taking? Authorizing Provider  CLARAVIS 40 MG capsule Take 40 mg by mouth daily.  09/30/17  Yes [provider]  diphenhydrAMINE (BENADRYL) 25 MG tablet Take 50 mg by mouth every 6 (six) hours as needed for itching or allergies.   Yes [provider]  hydrOXYzine (ATARAX/VISTARIL) 25 MG tablet Take 50 mg by mouth at bedtime as needed for anxiety (sleep). Reported on 12/21/2015   Yes [provider]  NOVOLOG 100 UNIT/ML injection ADMINISTER 200 UNITS VIA INSULIN PUMP EVERY 48 HOURS 01/30/18  Yes David Stall, MD  ACCU-CHEK FASTCLIX LANCETS MISC CHECK BLOOD SUGAR 6 TIMES DAILY Patient taking differently: 1 each by Other route 6 (six) times daily.  02/29/16   Verneda Skill, FNP  glucagon (GLUCAGEN HYPOKIT) 1 MG SOLR injection Inject 1mg  IM for extreme hypoglycemia. 11/25/17  David Stall, MD  insulin aspart (NOVOLOG FLEXPEN) 100 UNIT/ML FlexPen Use up to 50 units daily in the event pump fails 03/10/18   David Stall, MD  Insulin Glargine (LANTUS SOLOSTAR) 100 UNIT/ML Solostar Pen Use up to 50 units daily in the event pump fails 03/10/18   David Stall, MD  Urine Glucose-Ketones Test STRP If urine output and extreme thirst increases, collect urine and check with strip as directed. Disp 1 vial 06/30/13   [place], Waldo Laine, MD   No Known  Allergies  FAMILY HISTORY:  family history includes Autism in his sister; Cancer in his maternal grandfather; Diabetes in his paternal grandmother; Hypothyroidism in his mother; Kidney disease in his mother; Mental retardation in his sister; Other in his other. SOCIAL HISTORY:  reports that he has never smoked. He has never used smokeless tobacco. He reports that he does not drink alcohol.  REVIEW OF SYSTEMS:   Positives in BOLD:   Pt denies all complaints Gen: Denies fever, chills, weight change, fatigue, night sweats HEENT: Denies blurred vision, double vision, hearing loss, tinnitus, sinus congestion, rhinorrhea, sore throat, neck stiffness, dysphagia PULM: Denies shortness of breath, cough, sputum production, hemoptysis, wheezing CV: Denies chest pain, edema, orthopnea, paroxysmal nocturnal dyspnea, palpitations GI: Denies abdominal pain, nausea, vomiting, diarrhea, hematochezia, melena, constipation, change in bowel habits GU: Denies dysuria, hematuria, polyuria, oliguria, urethral discharge Endocrine: Denies hot or cold intolerance, polyuria, polyphagia or appetite change Derm: Denies rash, dry skin, scaling or peeling skin change Heme: Denies easy bruising, bleeding, bleeding gums Neuro: Denies headache, numbness, weakness, slurred speech, loss of memory or consciousness   SUBJECTIVE:  Denies shortness of breath, chest pain, palpitations, fever, chills Denies N/V or abdominal pain On room air  VITAL SIGNS: Temp:  [97.9 F (36.6 C)-98.8 F (37.1 C)] 98.8 F (37.1 C) (09/25 0200) Pulse Rate:  [112-135] 135 (09/25 0200) Resp:  [16-23] 20 (09/25 0200) BP: (109-133)/(50-67) 121/57 (09/25 0200) SpO2:  [94 %-99 %] 96 % (09/25 0200) Weight:  [65.8 kg-67.8 kg] 67.8 kg (09/25 0200)  PHYSICAL EXAMINATION: General:  Acutely ill appearing male, sitting in bed, on room air, in NAD Neuro:  Awake, alert, follows commands, no focal deficits HEENT:  Atraumatic, normocephalic, neck  supple, no JVD Cardiovascular:  Tachycardia, regular rate, s1s2, no M/R/G, 2+ pulses throughout Lungs:  Clear bilaterally, no wheezing, even, nonlabored, normal effort Abdomen:  Soft, nontender, nondistended, BS+ x4 Musculoskeletal:  No deformities, normal bulk and tone Skin:  Warm/dry.  No obvious rashes, lesions, or ulcerations noted  Recent Labs  Lab 04/15/18 2225 04/16/18 0329  NA 133* 135  K 6.5* 4.6  CL 99 108  CO2 9* 13*  BUN 29* 27*  CREATININE 1.49* 1.25*  GLUCOSE 571* 314*   Recent Labs  Lab 04/15/18 2225 04/16/18 0329  HGB 15.7 14.5  HCT 48.0 43.3  WBC 26.6* 23.3*  PLT 352 309   No results found.  ASSESSMENT / PLAN:  A: DKA Anion Gap metabolic acidosis in setting of DKA P: -Aggressive IVF -Received 1L NS in ED, will give an additional 1L bolus -IV insulin gtt-anion gap is improved, plan to stop insulin drip, diabetic coordinator working with the family to resume insulin pump -Follow DKA protocol-overall doing well -Keep NPO-start diet -BMP q4h-once off drip stopped taking every 4 hours  A: AKI Hyperkalemia in setting of AKI and metabolic acidosis -EKG with peaked T waves P: Monitor I&O's / urinary output Follow BMP- improvement noted last potassium was 4.4 Ensure  adequate renal perfusion- continue IV fluids Avoid nephrotoxic agents as able Replace electrolytes as indicated Cardiac monitoring Received Albuterol, IV insulin, and Calcium gluconate in ED -Hyperkalemia and creatinine both have improved   A: Leukocytosis -UA negative for UTI P: Monitor fever curve Trend WBC -Chest x-ray not showing any evidence of airspace disease -No evidence of other skin infection -More than likely higher WBCs due to leukemoid reaction due to DKA -Monitor with repeat CBC/WBC tomorrow morning -Continue IV fluids  Abnormal chest x-ray: Chest x-ray reported enlarged heart, patient is having significant tachycardia, EKG showed peaked T waves but some diffuse  ST changes -We will get echo to evaluate for pericarditis/pericardial effusion - Continue IV fluids and monitor closely  Sinus tachycardia: Check TSH, continue IV fluids, monitor for sepsis   DISPOSITION: Stepdown-if doing well can be transitioned to floor GOALS OF CARE: Full Code VTE PROPHYLAXIS: Lovenox UPDATES: Updated pt's mother at bedside 9/25.    Harlon Ditty, AGACNP-BC [facility] Pager: [phone]  04/16/2018, 4:35 AM   STAFF NOTE: I, Dr Roseanne Reno have personally reviewed patient's available data, including medical history, events of note, physical examination and test results as part of my evaluation. I have discussed with resident/NP and other care providers such as pharmacist, RN and RRT.  In addition,  I personally evaluated patient and elicited key findings of   Patient is independently seen and examined chart reviewed labs reviewed discussing the rounds -Appropriate adjustment in the notes done   Skin/Wound: Chronic skin changes  Electrolytes: Replace electrolytes per ICU electrolyte replacement protocol.   IVF: D5 half NS at 75/h  Nutrition: Diabetic diet  Prophylaxis: DVT Prophylaxis with Lovenox,. GI Prophylaxis.   Restraints: None  PT/OT eval and treat. OOB when appropriate.   Lines/Tubes: No Foley no central line.  ADVANCE DIRECTIVE: Full code  FAMILY DISCUSSION: Spoke with the mother  Quality Care: PPI, DVT prophylaxis, HOB elevated, Infection control all reviewed and addressed.  Events and notes from last 24 hours reviewed. Care plan discussed on multidisciplinary rounds  CC TIME: 32 minutes   Old records reviewed discussed results and management plan with patient  Images personally reviewed and results and labs reviewed and discussed with patient.  All medication reviewed and adjusted  Further management depending on test results and work up as outlined above.    Roseanne Reno, M.D

## 2018-04-17 ENCOUNTER — Inpatient Hospital Stay: Payer: Medicaid Other

## 2018-04-17 DIAGNOSIS — E875 Hyperkalemia: Secondary | ICD-10-CM

## 2018-04-17 DIAGNOSIS — E1010 Type 1 diabetes mellitus with ketoacidosis without coma: Principal | ICD-10-CM

## 2018-04-17 LAB — CBC WITH DIFFERENTIAL/PLATELET
BASOS ABS: 0 10*3/uL (ref 0–0.1)
BASOS PCT: 0 %
EOS PCT: 1 %
Eosinophils Absolute: 0.1 10*3/uL (ref 0–0.7)
HCT: 37.3 % — ABNORMAL LOW (ref 40.0–52.0)
Hemoglobin: 12.9 g/dL — ABNORMAL LOW (ref 13.0–18.0)
Lymphocytes Relative: 13 %
Lymphs Abs: 1.5 10*3/uL (ref 1.0–3.6)
MCH: 29 pg (ref 26.0–34.0)
MCHC: 34.6 g/dL (ref 32.0–36.0)
MCV: 83.9 fL (ref 80.0–100.0)
MONO ABS: 0.7 10*3/uL (ref 0.2–1.0)
Monocytes Relative: 6 %
Neutro Abs: 8.9 10*3/uL — ABNORMAL HIGH (ref 1.4–6.5)
Neutrophils Relative %: 80 %
PLATELETS: 235 10*3/uL (ref 150–440)
RBC: 4.45 MIL/uL (ref 4.40–5.90)
RDW: 13.6 % (ref 11.5–14.5)
WBC: 11.1 10*3/uL — ABNORMAL HIGH (ref 3.8–10.6)

## 2018-04-17 LAB — COMPREHENSIVE METABOLIC PANEL
ALBUMIN: 3.4 g/dL — AB (ref 3.5–5.0)
ALT: 18 U/L (ref 0–44)
AST: 18 U/L (ref 15–41)
Alkaline Phosphatase: 76 U/L (ref 38–126)
Anion gap: 11 (ref 5–15)
BUN: 11 mg/dL (ref 6–20)
CHLORIDE: 106 mmol/L (ref 98–111)
CO2: 21 mmol/L — AB (ref 22–32)
Calcium: 8.1 mg/dL — ABNORMAL LOW (ref 8.9–10.3)
Creatinine, Ser: 0.66 mg/dL (ref 0.61–1.24)
GFR calc Af Amer: >60 mL/min (ref >60)
GLUCOSE: 126 mg/dL — AB (ref 70–99)
POTASSIUM: 3.6 mmol/L (ref 3.5–5.1)
SODIUM: 138 mmol/L (ref 135–145)
Total Bilirubin: 1.3 mg/dL — ABNORMAL HIGH (ref 0.3–1.2)
Total Protein: 6.3 g/dL — ABNORMAL LOW (ref 6.5–8.1)

## 2018-04-17 LAB — PROTIME-INR
INR: 1.05
Prothrombin Time: 13.6 seconds (ref 11.4–15.2)

## 2018-04-17 LAB — BASIC METABOLIC PANEL
Anion gap: 7 (ref 5–15)
BUN: 13 mg/dL (ref 6–20)
CHLORIDE: 107 mmol/L (ref 98–111)
CO2: 23 mmol/L (ref 22–32)
CREATININE: 0.73 mg/dL (ref 0.61–1.24)
Calcium: 8.1 mg/dL — ABNORMAL LOW (ref 8.9–10.3)
GFR calc non Af Amer: >60 mL/min (ref >60)
Glucose, Bld: 162 mg/dL — ABNORMAL HIGH (ref 70–99)
Potassium: 3.5 mmol/L (ref 3.5–5.1)
SODIUM: 137 mmol/L (ref 135–145)

## 2018-04-17 LAB — HIV ANTIBODY (ROUTINE TESTING W REFLEX): HIV Screen 4th Generation wRfx: NONREACTIVE

## 2018-04-17 LAB — GLUCOSE, CAPILLARY
GLUCOSE-CAPILLARY: 122 mg/dL — AB (ref 70–99)
GLUCOSE-CAPILLARY: 108 mg/dL — AB (ref 70–99)
GLUCOSE-CAPILLARY: 273 mg/dL — AB (ref 70–99)
GLUCOSE-CAPILLARY: 256 mg/dL — AB (ref 70–99)
Glucose-Capillary: 168 mg/dL — ABNORMAL HIGH (ref 70–99)
Glucose-Capillary: 249 mg/dL — ABNORMAL HIGH (ref 70–99)

## 2018-04-17 LAB — ECHOCARDIOGRAM COMPLETE
HEIGHTINCHES: 67 in
Weight: 2391.55 oz

## 2018-04-17 LAB — MAGNESIUM: MAGNESIUM: 1.8 mg/dL (ref 1.7–2.4)

## 2018-04-17 MED ORDER — POTASSIUM CHLORIDE CRYS ER 20 MEQ PO TBCR
20.0000 meq | EXTENDED_RELEASE_TABLET | Freq: Once | ORAL | Status: AC
  Administered 2018-04-17: 20 meq via ORAL
  Filled 2018-04-17: qty 1

## 2018-04-17 MED ORDER — MAGNESIUM SULFATE IN D5W 1-5 GM/100ML-% IV SOLN
1.0000 g | Freq: Once | INTRAVENOUS | Status: AC
  Administered 2018-04-17: 1 g via INTRAVENOUS
  Filled 2018-04-17: qty 100

## 2018-04-17 NOTE — Progress Notes (Signed)
Report given to 2A charge nurse Melissa, with no further questions.  Family and patient updated.  Merry Proud, Night RN updated, per Merry Proud will transport patient out.

## 2018-04-17 NOTE — Progress Notes (Signed)
Inpatient Diabetes Program Recommendations  AACE/ADA: New Consensus Statement on Inpatient Glycemic Control (2019)  Target Ranges:  Prepandial:   less than 140 mg/dL      Peak postprandial:   less than 180 mg/dL (1-2 hours)      Critically ill patients:  140 - 180 mg/dL  Results for Johnny [name], HAGMANN (MRN [id number]) as of 04/17/2018 08:56  Ref. Range 04/16/2018 12:23 04/16/2018 13:29 04/16/2018 14:44 04/16/18 14:48 04/16/2018 15:55 04/16/18 17:40 04/16/2018 18:07 04/16/2018 19:28 04/16/2018 21:35 04/17/2018 02:33 04/17/2018 07:46  Glucose-Capillary Latest Ref Range: 70 - 99 mg/dL 96  Insulin drip 0 units/hr 132 (H)  Insulin drip 1.8 units/hr 188 (H)  Insulin drip 4.5 units/hr  Patient ate 50% of lunch and NO insulin coverage for carbs  Insulin Pump RESTARTED 205 (H)  Insulin Pump 0.6 units  Insulin drip STOPPED   Insulin Pump 3.3 units for carbs 355 (H)  Novolog 15 units SQ 298 (H) 150 (H) 122 (H) 108 (H)    Review of Glycemic Control  Diabetes history:  DM1 (makes NO insulin; requires basal, correction, and carbohydrate coverage) Outpatient Diabetes medications: Novolog in OmniPod insulin pump with Dexcom G6 CGM Current orders for Inpatient glycemic control: Insulin pump ACHS & 2am, Novolog 0-15 units TID with meals, Novolog 0-5 units QHS  Inpatient Diabetes Program Recommendations: Correction (SSI): CBGs improved and patient currently using insulin pump for glycemic control. Please consider discontinuing Novolog correction scale and allow patient or his mother to do a correction with his insulin pump if needed.  NOTE: Noted glucose increased yesterday afternoon after patient was transitioned from IV insulin back to his insulin pump so Novolog correction scale was ordered due to concern if insulin pump working correctly.  Patient ate lunch around 14:00 and got no carb coverage for carbohydrates at that time and insulin pump restarted around 14:48. Anticipate hyperglycemia on 04/16/18 after  insulin pump restart was related to carbohydrates consumed. CBGs have ranged from 108-162 mg/dl over the past 12 hours with just using his insulin pump. Please consider discontinuing Novolog correction scale. Will continue to follow while inpatient.  Thank you, Orlando Penner, RN, MSN, CDE Diabetes Coordinator Inpatient Diabetes Program [phone] (Team Pager from 8am to 5pm)

## 2018-04-17 NOTE — Progress Notes (Signed)
   04/17/18 1325  Clinical Encounter Type  Visited With Patient and family together  Visit Type Follow-up  Spiritual Encounters  Spiritual Needs Emotional;Prayer   Chaplain met with patient and mother.  Patient was getting ready to rest.  Conversation with patient's mother around patient's current health situation and desire to return home.  Chaplain prayed for patient, family, and care team at mother's request.

## 2018-04-17 NOTE — Progress Notes (Signed)
Patient transferred to [facility] with no telemetry. Nurse Tech Merline and transport, [place] transporting. CCMD notified.  Trudee Kuster

## 2018-04-17 NOTE — Progress Notes (Signed)
Mom wants to reschedule flu vaccine for when patient is getting discharged.  Will reschedule for tomorrow am at this time.

## 2018-04-17 NOTE — Progress Notes (Addendum)
[facility] - [place] at [facility]   PATIENT NAME: Johnny [name]    MR#:  [id number]  DATE OF BIRTH:  11-26-1995  SUBJECTIVE:  CHIEF COMPLAINT:   Chief Complaint  Patient presents with  . Hyperglycemia   Came with DKA.  Clinically better off insulin drip.  Anion gap closed  REVIEW OF SYSTEMS:   Can not give ROS as have Autism.  ROS  DRUG ALLERGIES:  No Known Allergies  VITALS:  Blood pressure 116/84, pulse 91, temperature 97.7 F (36.5 C), temperature source Oral, resp. rate 11, height 5\' 7"  (1.702 m), weight 67.8 kg, SpO2 99 %.  PHYSICAL EXAMINATION:  GENERAL:  22 y.o.-year-old patient lying in the bed with no acute distress.  EYES: Pupils equal, round, reactive to light and accommodation. No scleral icterus. Extraocular muscles intact.  HEENT: Head atraumatic, normocephalic. Oropharynx and nasopharynx clear.  NECK:  Supple, no jugular venous distention. No thyroid enlargement, no tenderness.  LUNGS: Normal breath sounds bilaterally, no wheezing, rales,rhonchi or crepitation. No use of accessory muscles of respiration.  CARDIOVASCULAR: S1, S2 normal. No murmurs, rubs, or gallops.  ABDOMEN: Soft, nontender, nondistended. Bowel sounds present. No organomegaly or mass.  EXTREMITIES: No pedal edema, cyanosis, or clubbing.  NEUROLOGIC: Cranial nerves II through XII are intact. Muscle strength 5/5 in all extremities. Sensation intact. Gait not checked.  PSYCHIATRIC: The patient is alert and oriented x 0.  SKIN: No obvious rash, lesion, or ulcer.   Physical Exam LABORATORY PANEL:   CBC Recent Labs  Lab 04/17/18 0546  WBC 11.1*  HGB 12.9*  HCT 37.3*  PLT 235   ------------------------------------------------------------------------------------------------------------------  Chemistries  Recent Labs  Lab 04/17/18 0546  NA 138  K 3.6  CL 106  CO2 21*  GLUCOSE 126*  BUN 11  CREATININE 0.66  CALCIUM 8.1*  MG 1.8  AST 18  ALT 18  ALKPHOS 76   BILITOT 1.3*   ------------------------------------------------------------------------------------------------------------------  Cardiac Enzymes No results for input(s): TROPONINI in the last 168 hours. ------------------------------------------------------------------------------------------------------------------  RADIOLOGY:  Dg Chest Port 1 View  Result Date: 04/17/2018 CLINICAL DATA:  Shortness of breath. EXAM: PORTABLE CHEST 1 VIEW COMPARISON:  Radiograph of April 16, 2018. FINDINGS: The heart size and mediastinal contours are within normal limits. Both lungs are clear. No pneumothorax or pleural effusion is noted. The visualized skeletal structures are unremarkable. IMPRESSION: No acute cardiopulmonary abnormality seen. Electronically Signed   By: Lupita Raider, M.D.   On: 04/17/2018 09:12   Dg Chest Port 1 View  Result Date: 04/16/2018 CLINICAL DATA:  Leukocytosis. EXAM: PORTABLE CHEST 1 VIEW COMPARISON:  10/15/2017. FINDINGS: Interval enlargement of the cardiac silhouette. The lungs remain clear with normal vascularity. Unremarkable bones. IMPRESSION: Interval cardiomegaly, magnified by the portable AP technique. The increase in size could be due to an interval pericardial effusion. Otherwise, normal examination. Electronically Signed   By: Beckie Salts M.D.   On: 04/16/2018 10:47    ASSESSMENT AND PLAN:   Principal Problem:   DKA, type 1 (HCC) Active Problems:   Autistic disorder, active   Hyperkalemia  *  DKA, type 1 (HCC) - Clinically improving IV insulin discontinued  Provided aggressive hydration with IV fluids Insulin pump resumed monitor sugars closely  Diabetic coordinator is following  *  Hyperkalemia -resolved  *  Autistic disorder-outpatient follow-up  Leukocytosis: Stress    All the records are reviewed and case discussed with Care Management/Social Workerr. Management plans discussed with the patient, mom at bedside and they are  in  agreement.  CODE STATUS: Full.  TOTAL TIME TAKING CARE OF THIS PATIENT: 35 minutes.    POSSIBLE D/C IN 1-2 DAYS, DEPENDING ON CLINICAL CONDITION.   Ramonita Lab M.D on 04/17/2018   Between 7am to 6pm - Pager - [phone]  After 6pm go to [email or web address] - password [email or web address]  [facility]  Office  [phone]  CC: Primary care physician; [facility]  Note: This dictation was prepared with Dragon dictation along with smaller phrase technology. Any transcriptional errors that result from this process are unintentional.

## 2018-04-17 NOTE — Progress Notes (Signed)
Name: Johnny [name] MRN: [id number] DOB: 1996-07-14    ADMISSION DATE:  04/15/2018  BRIEF PATIENT DESCRIPTION:  22 y.o. Male admitted with DKA, AKI, and Hyperkalemia  SIGNIFICANT EVENTS/STUDIES   04/16/18>> Admission to [facility] Stepdown Unit with DKA on insulin gtt   HISTORY OF PRESENT ILLNESS:   Mr. Johnny [name] is a 22 y.o. Male with a PMH of Autism, ADHD, DM I, and allergic rhinitis who presents to [facility] ED on 04/15/18 with c/o hyperglycemia, vomiting, and elevated urine ketones.  Due to pt's autism he is not able to give a detailed history, therefore history is obtained from pt's mother.  His mother reports that he was not feeling well in the am of 9/24 when she dropped him off at school, and that shortly after was called to come pick him up from school due to vomiting.  She checked his glucose at home and was noted to be greater than 500, and urine serum ketones elevated.  She reports that in the evening of 9/23 he reported to her that he was quite thirsty and having to pee a lot. She reports that he has not had fever or chills, or sputum.  She also reports that she feels his insulin pump may have malfunctioned. In the ED initial workup revealed Glucose 571, CO2 9, Anion gap 25, Na 133, K 6.5, Creatinine 1.49, Beta-hydroxybutyric acid 7.05, WBC 26.6, and UA positive for ketones.  In the ED he received albuterol, IV insulin, and Calcium gluconate for Hyperkalemia, and 1 L NS bolus and was started on insulin drip.  He is admitted to [facility] for treatment of DKA, AKI, and Hyperkalemia.  PCCM is consulted for further management.  REVIEW OF SYSTEMS:   Positives in BOLD: Pt denies all complaints Gen: Denies fever, chills, weight change, fatigue, night sweats HEENT: Denies blurred vision, double vision, hearing loss, tinnitus, sinus congestion, rhinorrhea, sore throat, neck stiffness, dysphagia PULM: Denies shortness of breath, cough, sputum production, hemoptysis, wheezing CV: Denies chest pain, edema,  orthopnea, paroxysmal nocturnal dyspnea, palpitations GI: Denies abdominal pain, nausea, vomiting, diarrhea, hematochezia, melena, constipation, change in bowel habits GU: Denies dysuria, hematuria, polyuria, oliguria, urethral discharge Endocrine: Denies hot or cold intolerance, polyuria, polyphagia or appetite change Derm: Denies rash, dry skin, scaling or peeling skin change Heme: Denies easy bruising, bleeding, bleeding gums Neuro: Denies headache, numbness, weakness, slurred speech, loss of memory or consciousness  SUBJECTIVE:  No complaints pt currently eating lunch   VITAL SIGNS: Temp:  [97.7 F (36.5 C)-99.1 F (37.3 C)] 97.7 F (36.5 C) (09/26 0800) Pulse Rate:  [98-123] 109 (09/26 0800) Resp:  [13-22] 22 (09/26 0800) BP: (97-122)/(48-79) 107/68 (09/26 0800) SpO2:  [93 %-100 %] 94 % (09/26 0800)  PHYSICAL EXAMINATION: General: Well developed, well nourished male NAD on RA sitting in bed, on room air, in NAD Neuro:  Awake, alert, follows commands, no focal deficits HEENT:  Atraumatic, normocephalic, neck supple, no JVD Cardiovascular: NSR, rrr, no M/R/G, 2+ pulses throughout Lungs: Clear bilaterally, no wheezing, even, nonlabored, normal effort Abdomen: Soft, nontender, nondistended, BS+ x4 Musculoskeletal: No deformities, normal bulk and tone, no edema  Skin: Warm/dry. No obvious rashes, lesions, or ulcerations noted  Recent Labs  Lab 04/16/18 1947 04/17/18 0046 04/17/18 0546  NA 132* 137 138  K 3.7 3.5 3.6  CL 104 107 106  CO2 21* 23 21*  BUN 17 13 11   CREATININE 0.95 0.73 0.66  GLUCOSE 287* 162* 126*   Recent Labs  Lab 04/15/18 2225 04/16/18  0329 04/17/18 0546  HGB 15.7 14.5 12.9*  HCT 48.0 43.3 37.3*  WBC 26.6* 23.3* 11.1*  PLT 352 309 235   Dg Chest Port 1 View  Result Date: 04/17/2018 CLINICAL DATA:  Shortness of breath. EXAM: PORTABLE CHEST 1 VIEW COMPARISON:  Radiograph of April 16, 2018. FINDINGS: The heart size and mediastinal contours are  within normal limits. Both lungs are clear. No pneumothorax or pleural effusion is noted. The visualized skeletal structures are unremarkable. IMPRESSION: No acute cardiopulmonary abnormality seen. Electronically Signed   By: Lupita Raider, M.D.   On: 04/17/2018 09:12   Dg Chest Port 1 View  Result Date: 04/16/2018 CLINICAL DATA:  Leukocytosis. EXAM: PORTABLE CHEST 1 VIEW COMPARISON:  10/15/2017. FINDINGS: Interval enlargement of the cardiac silhouette. The lungs remain clear with normal vascularity. Unremarkable bones. IMPRESSION: Interval cardiomegaly, magnified by the portable AP technique. The increase in size could be due to an interval pericardial effusion. Otherwise, normal examination. Electronically Signed   By: Beckie Salts M.D.   On: 04/16/2018 10:47    ASSESSMENT / PLAN:  A: DKA-resolved Anion Gap metabolic acidosis in setting of DKA-resolved P: Continue insulin pump with outpatient settings and discontinue SSI  CBG's ac/hs and 3am   Follow hypoglycemic protocol   A: AKI with hyperkalemia and metabolic acidosis-resolved  P: Trend BMP  Replace electrolytes as indicated  Monitor UOP  Avoid nephrotoxic medications   A: Leukocytosis (no signs of infection)-improving  -UA negative for UTI P: Trend WBC and monitor fever curve  Chest x-ray not showing any evidence of airspace disease No evidence of other skin infection More than likely higher WBCs due to leukemoid reaction due to DKA Continue NS for now @75  ml/hr once po intake improved   Abnormal chest x-ray (Echo 04/16/18-EF) : Chest x-ray reported enlarged heart, patient is having significant tachycardia, EKG showed peaked T waves but some diffuse ST changes  Sinus tachycardia-resolved  Continuous telemetry monitoring   -Pt had increased CBG readings once insulin gtt discontinued 04/16/18 requiring both outpatient insulin regimen and SSI.  CBG readings improved today 04/17/18, therefore SSI discontinued will manage  CBG with outpatient insulin pump regimen only.  Will transfer pt to medsurg unit today 04/17/18 once bed becomes available PCCM will sign off.  If further assistance needed call on call pager# listed in AMION.  Sonda Rumble, AGNP  Pulmonary/Critical Care Pager [phone] (please enter 7 digits) PCCM Consult Pager [phone] (please enter 7 digits)

## 2018-04-18 LAB — CBC WITH DIFFERENTIAL/PLATELET
BASOS ABS: 0 10*3/uL (ref 0–0.1)
BASOS PCT: 0 %
EOS ABS: 0.1 10*3/uL (ref 0–0.7)
Eosinophils Relative: 2 %
HEMATOCRIT: 38.2 % — AB (ref 40.0–52.0)
Hemoglobin: 13.3 g/dL (ref 13.0–18.0)
Lymphocytes Relative: 29 %
Lymphs Abs: 1.6 10*3/uL (ref 1.0–3.6)
MCH: 29.1 pg (ref 26.0–34.0)
MCHC: 35 g/dL (ref 32.0–36.0)
MCV: 83.2 fL (ref 80.0–100.0)
MONO ABS: 0.5 10*3/uL (ref 0.2–1.0)
MONOS PCT: 9 %
NEUTROS ABS: 3.5 10*3/uL (ref 1.4–6.5)
NEUTROS PCT: 60 %
Platelets: 217 10*3/uL (ref 150–440)
RBC: 4.58 MIL/uL (ref 4.40–5.90)
RDW: 13.1 % (ref 11.5–14.5)
WBC: 5.7 10*3/uL (ref 3.8–10.6)

## 2018-04-18 LAB — BASIC METABOLIC PANEL
ANION GAP: 6 (ref 5–15)
BUN: 11 mg/dL (ref 6–20)
CALCIUM: 8.1 mg/dL — AB (ref 8.9–10.3)
CO2: 30 mmol/L (ref 22–32)
CREATININE: 0.56 mg/dL — AB (ref 0.61–1.24)
Chloride: 103 mmol/L (ref 98–111)
Glucose, Bld: 159 mg/dL — ABNORMAL HIGH (ref 70–99)
Potassium: 3.4 mmol/L — ABNORMAL LOW (ref 3.5–5.1)
SODIUM: 139 mmol/L (ref 135–145)

## 2018-04-18 LAB — GLUCOSE, CAPILLARY
GLUCOSE-CAPILLARY: 190 mg/dL — AB (ref 70–99)
GLUCOSE-CAPILLARY: 121 mg/dL — AB (ref 70–99)
Glucose-Capillary: 181 mg/dL — ABNORMAL HIGH (ref 70–99)

## 2018-04-18 MED ORDER — POTASSIUM CHLORIDE CRYS ER 20 MEQ PO TBCR
40.0000 meq | EXTENDED_RELEASE_TABLET | Freq: Once | ORAL | Status: AC
  Administered 2018-04-18: 40 meq via ORAL
  Filled 2018-04-18: qty 2

## 2018-04-18 NOTE — Progress Notes (Signed)
Inpatient Diabetes Program Recommendations  AACE/ADA: New Consensus Statement on Inpatient Glycemic Control (2019)  Target Ranges:  Prepandial:   less than 140 mg/dL      Peak postprandial:   less than 180 mg/dL (1-2 hours)      Critically ill patients:  140 - 180 mg/dL   Results for Johnny [name], Johnny [name] (MRN [id number]) as of 04/18/2018 12:55  Ref. Range 04/17/2018 07:46 04/17/2018 12:00 04/17/2018 16:24 04/17/2018 19:57 04/17/2018 21:11 04/18/2018 02:40 04/18/2018 07:34 04/18/2018 12:03  Glucose-Capillary Latest Ref Range: 70 - 99 mg/dL 409 (H) 811 (H) 914 (H) 256 (H) 249 (H) 190 (H) 121 (H) 181 (H)    Review of Glycemic Control  Diabetes history: DM1 (makes NO insulin; requires basal, correction, and carbohydrate coverage) Outpatient Diabetes medications:Novolog in OmniPod insulin pump with Dexcom G6 CGM Current orders for Inpatient glycemic control: Insulin pump ACHS & 2am  NOTE: Paged by RN to request visit with patient's mother at her request. Talked with patient's mother. Patient's mother asked about whether it would be alright to wait and let patient get flu vaccine at Dr. Juluis Mire office. Explained that patient can wait and have flu vaccine administered at Dr. Juluis Mire office if she prefers; just be sure that he gets it at his next visit in October. She also expressed concern about what to do if patient starts to go back into DKA. Reviewed trouble shooting with her again (checking glucose, doing correction on pump, checking ketones, if glucose is not coming down following 1-2 correction to change out insulin pump pod, and calling Dr. Juluis Mire office).   Patient's mother admits that she knows she has been taught about changing out insulin pump pod if glucose is not coming down but she admits that she forgot and she questions herself and her knowledge.  She asked if she could receive outpatient DM education again just to review things and also to allow her husband to be formally educated. Will place  order for outpatient DM education (she prefers to be done at [facility] which is closer to home for her). Provided emotional support and provided encouragement to patient and his mother.   Patient's mother verbalized understanding of information discussed and states that she has no further questions or concerns at this time.   Thanks, Orlando Penner, RN, MSN, CDE Diabetes Coordinator Inpatient Diabetes Program [phone] (Team Pager from 8am to 5pm)

## 2018-04-18 NOTE — Discharge Instructions (Signed)
Follow-up with primary care physician in 3 to 4 days Follow-up with endocrinologist Dr. Apolinar Junes as scheduled on October 13

## 2018-04-18 NOTE — Discharge Summary (Signed)
[facility] - [place] at [facility]   PATIENT NAME: Johnny [name]    MR#:  [id number]  DATE OF BIRTH:  1995-11-02  DATE OF ADMISSION:  04/15/2018 ADMITTING PHYSICIAN: Ramonita Lab, MD  DATE OF DISCHARGE:  04/18/18   PRIMARY CARE PHYSICIAN: [facility]    ADMISSION DIAGNOSIS:  Hyperkalemia [E87.5] Renal insufficiency [N28.9] Diabetic ketoacidosis without coma associated with type 1 diabetes mellitus (HCC) [E10.10]  DISCHARGE DIAGNOSIS:  Principal Problem:   DKA, type 1 (HCC) Active Problems:   Autistic disorder, active   Hyperkalemia   SECONDARY DIAGNOSIS:   Past Medical History:  Diagnosis Date  . ADHD (attention deficit hyperactivity disorder)   . Allergic rhinitis   . Autism   . Diabetes mellitus without complication [facility])     HOSPITAL COURSE:   HPI  Johnny [name]  is a 22 y.o. male who presents with chief complaint as above.  Patient has autism and is unable to contribute much information to his history.  His mother is at bedside and provides details about the history of present illness.  She states that he came home from school feeling sick, with nausea and some vomiting.  He has a glucometer that attaches to his arm, but she states that it had malfunctioned her come out at school.  She checked his glucose at home and it was over 500, and he had persistent polydipsia.  She brought him to the ED for evaluation and he was found here to be in DKA.  He also had hyperkalemia with peaking of his T waves on EKG.  He was treated with nebulized albuterol, and IV insulin was started and hospitalist were called for admission  * DKA, type 1 (HCC) - Clinically improving IV insulin discontinued  Provided aggressive hydration with IV fluids Insulin pump resumed monitored sugars closely , tallied Accu-Cheks done by mom with home glucometer with hospital Accu-Cheks and confirmed that mom's glucometer is working all right Diabetic coordinator is  following Follow-up with Dr. Apolinar Junes patient's endocrinologist on October 13 as scheduled  *Hyperkalemia -resolved  *Autistic disorder-outpatient follow-up  Leukocytosis: Stress related ,nml now   DISCHARGE CONDITIONS:   stable  CONSULTS OBTAINED:     PROCEDURES  NONE   DRUG ALLERGIES:  No Known Allergies  DISCHARGE MEDICATIONS:   Allergies as of 04/18/2018   No Known Allergies     Medication List    TAKE these medications   ACCU-CHEK FASTCLIX LANCETS Misc CHECK BLOOD SUGAR 6 TIMES DAILY What changed:  See the new instructions.   CLARAVIS 40 MG capsule Generic drug:  ISOtretinoin Take 40 mg by mouth daily.   diphenhydrAMINE 25 MG tablet Commonly known as:  BENADRYL Take 50 mg by mouth every 6 (six) hours as needed for itching or allergies.   glucagon 1 MG Solr injection Commonly known as:  GLUCAGEN Inject 1mg  IM for extreme hypoglycemia.   hydrOXYzine 25 MG tablet Commonly known as:  ATARAX/VISTARIL Take 50 mg by mouth at bedtime as needed for anxiety (sleep). Reported on 12/21/2015   Insulin Glargine 100 UNIT/ML Solostar Pen Commonly known as:  LANTUS Use up to 50 units daily in the event pump fails   NOVOLOG 100 UNIT/ML injection Generic drug:  insulin aspart ADMINISTER 200 UNITS VIA INSULIN PUMP EVERY 48 HOURS   insulin aspart 100 UNIT/ML FlexPen Commonly known as:  NOVOLOG Use up to 50 units daily in the event pump fails   Urine Glucose-Ketones Test Strp If urine output and  extreme thirst increases, collect urine and check with strip as directed. Disp 1 vial        DISCHARGE INSTRUCTIONS:   Follow-up with primary care physician in 3 to 4 days Follow-up with endocrinologist Dr. Apolinar Junes as scheduled on October 13  DIET:  Diabetic diet  DISCHARGE CONDITION:  Fair  ACTIVITY:  Activity as tolerated  OXYGEN:  Home Oxygen: No.   Oxygen Delivery: room air  DISCHARGE LOCATION:  home   If you experience worsening of your  admission symptoms, develop shortness of breath, life threatening emergency, suicidal or homicidal thoughts you must seek medical attention immediately by calling 911 or calling your MD immediately  if symptoms less severe.  You Must read complete instructions/literature along with all the possible adverse reactions/side effects for all the Medicines you take and that have been prescribed to you. Take any new Medicines after you have completely understood and accpet all the possible adverse reactions/side effects.   Please note  You were cared for by a hospitalist during your hospital stay. If you have any questions about your discharge medications or the care you received while you were in the hospital after you are discharged, you can call the unit and asked to speak with the hospitalist on call if the hospitalist that took care of you is not available. Once you are discharged, your primary care physician will handle any further medical issues. Please note that NO REFILLS for any discharge medications will be authorized once you are discharged, as it is imperative that you return to your primary care physician (or establish a relationship with a primary care physician if you do not have one) for your aftercare needs so that they can reassess your need for medications and monitor your lab values.     Today  Chief Complaint  Patient presents with  . Hyperglycemia    Patient is doing fine no complaints mom at bedside ROS:  CONSTITUTIONAL: Denies fevers, chills. Denies any fatigue, weakness.  EYES: Denies blurry vision, double vision, eye pain. EARS, NOSE, THROAT: Denies tinnitus, ear pain, hearing loss. RESPIRATORY: Denies cough, wheeze, shortness of breath.  CARDIOVASCULAR: Denies chest pain, palpitations, edema.  GASTROINTESTINAL: Denies nausea, vomiting, diarrhea, abdominal pain. Denies bright red blood per rectum. GENITOURINARY: Denies dysuria, hematuria. ENDOCRINE: Denies nocturia or  thyroid problems. HEMATOLOGIC AND LYMPHATIC: Denies easy bruising or bleeding. SKIN: Denies rash or lesion. MUSCULOSKELETAL: Denies pain in neck, back, shoulder, knees, hips or arthritic symptoms.  NEUROLOGIC: Denies paralysis, paresthesias.  PSYCHIATRIC: Denies anxiety or depressive symptoms.   VITAL SIGNS:  Blood pressure 130/86, pulse 86, temperature 98.3 F (36.8 C), resp. rate 18, height 5\' 7"  (1.702 m), weight 67.8 kg, SpO2 99 %.  I/O:    Intake/Output Summary (Last 24 hours) at 04/18/2018 1204 Last data filed at 04/18/2018 0713 Gross per 24 hour  Intake 1191.37 ml  Output 700 ml  Net 491.37 ml    PHYSICAL EXAMINATION:  GENERAL:  22 y.o.-year-old patient lying in the bed with no acute distress.  EYES: Pupils equal, round, reactive to light and accommodation. No scleral icterus. Extraocular muscles intact.  HEENT: Head atraumatic, normocephalic. Oropharynx and nasopharynx clear.  NECK:  Supple, no jugular venous distention. No thyroid enlargement, no tenderness.  LUNGS: Normal breath sounds bilaterally, no wheezing, rales,rhonchi or crepitation. No use of accessory muscles of respiration.  CARDIOVASCULAR: S1, S2 normal. No murmurs, rubs, or gallops.  ABDOMEN: Soft, non-tender, non-distended. Bowel sounds present. No organomegaly or mass.  EXTREMITIES: No pedal edema,  cyanosis, or clubbing.  NEUROLOGIC: Cranial nerves II through XII are intact. Muscle strength 5/5 in all extremities. Sensation intact. Gait not checked.  PSYCHIATRIC: The patient is alert and oriented x 3.  SKIN: No obvious rash, lesion, or ulcer.   DATA REVIEW:   CBC Recent Labs  Lab 04/18/18 0511  WBC 5.7  HGB 13.3  HCT 38.2*  PLT 217    Chemistries  Recent Labs  Lab 04/17/18 0546 04/18/18 0511  NA 138 139  K 3.6 3.4*  CL 106 103  CO2 21* 30  GLUCOSE 126* 159*  BUN 11 11  CREATININE 0.66 0.56*  CALCIUM 8.1* 8.1*  MG 1.8  --   AST 18  --   ALT 18  --   ALKPHOS 76  --   BILITOT 1.3*   --     Cardiac Enzymes No results for input(s): TROPONINI in the last 168 hours.  Microbiology Results  Results for orders placed or performed during the hospital encounter of 04/15/18  MRSA PCR Screening     Status: None   Collection Time: 04/16/18  2:24 AM  Result Value Ref Range Status   MRSA by PCR NEGATIVE NEGATIVE Final    Comment:        The GeneXpert MRSA Assay (FDA approved for NASAL specimens only), is one component of a comprehensive MRSA colonization surveillance program. It is not intended to diagnose MRSA infection nor to guide or monitor treatment for MRSA infections. Performed at [facility], [address], [place], [place] [address]     RADIOLOGY:  Dg Chest Port 1 View  Result Date: 04/17/2018 CLINICAL DATA:  Shortness of breath. EXAM: PORTABLE CHEST 1 VIEW COMPARISON:  Radiograph of April 16, 2018. FINDINGS: The heart size and mediastinal contours are within normal limits. Both lungs are clear. No pneumothorax or pleural effusion is noted. The visualized skeletal structures are unremarkable. IMPRESSION: No acute cardiopulmonary abnormality seen. Electronically Signed   By: Lupita Raider, M.D.   On: 04/17/2018 09:12   Dg Chest Port 1 View  Result Date: 04/16/2018 CLINICAL DATA:  Leukocytosis. EXAM: PORTABLE CHEST 1 VIEW COMPARISON:  10/15/2017. FINDINGS: Interval enlargement of the cardiac silhouette. The lungs remain clear with normal vascularity. Unremarkable bones. IMPRESSION: Interval cardiomegaly, magnified by the portable AP technique. The increase in size could be due to an interval pericardial effusion. Otherwise, normal examination. Electronically Signed   By: Beckie Salts M.D.   On: 04/16/2018 10:47    EKG:   Orders placed or performed during the hospital encounter of 04/15/18  . ED EKG  . ED EKG  . EKG 12-Lead  . EKG 12-Lead      Management plans discussed with the patient, family and they are in agreement.  CODE STATUS:      Code Status Orders  (From admission, onward)         Start     Ordered   04/16/18 0226  Full code  Continuous     04/16/18 0225        Code Status History    Date Active Date Inactive Code Status Order ID Comments User Context   06/26/2013 2157 07/04/2013 1937 Full Code [id number]  Marena Chancy, MD Inpatient      TOTAL TIME TAKING CARE OF THIS PATIENT: 41 minutes.   Note: This dictation was prepared with Dragon dictation along with smaller phrase technology. Any transcriptional errors that result from this process are unintentional.   @MEC @  on 04/18/2018  at 12:04 PM  Between 7am to 6pm - Pager - [phone]  After 6pm go to [email or web address] - password [email or web address] [facility]  [facility]  Office  [phone]  CC: Primary care physician; [facility]

## 2018-04-28 ENCOUNTER — Ambulatory Visit: Payer: Self-pay | Admitting: Adult Health

## 2018-05-07 ENCOUNTER — Encounter (INDEPENDENT_AMBULATORY_CARE_PROVIDER_SITE_OTHER): Payer: Self-pay | Admitting: "Endocrinology

## 2018-05-07 ENCOUNTER — Ambulatory Visit (INDEPENDENT_AMBULATORY_CARE_PROVIDER_SITE_OTHER): Payer: Medicaid Other | Admitting: "Endocrinology

## 2018-05-07 VITALS — BP 112/68 | HR 90 | Ht 66.61 in | Wt 152.6 lb

## 2018-05-07 DIAGNOSIS — E049 Nontoxic goiter, unspecified: Secondary | ICD-10-CM

## 2018-05-07 DIAGNOSIS — F4320 Adjustment disorder, unspecified: Secondary | ICD-10-CM

## 2018-05-07 DIAGNOSIS — E1042 Type 1 diabetes mellitus with diabetic polyneuropathy: Secondary | ICD-10-CM

## 2018-05-07 DIAGNOSIS — E1065 Type 1 diabetes mellitus with hyperglycemia: Secondary | ICD-10-CM | POA: Diagnosis not present

## 2018-05-07 DIAGNOSIS — Z23 Encounter for immunization: Secondary | ICD-10-CM

## 2018-05-07 DIAGNOSIS — E10649 Type 1 diabetes mellitus with hypoglycemia without coma: Secondary | ICD-10-CM

## 2018-05-07 DIAGNOSIS — IMO0001 Reserved for inherently not codable concepts without codable children: Secondary | ICD-10-CM

## 2018-05-07 DIAGNOSIS — E063 Autoimmune thyroiditis: Secondary | ICD-10-CM

## 2018-05-07 LAB — POCT GLUCOSE (DEVICE FOR HOME USE): POC GLUCOSE: 171 mg/dL — AB (ref 70–99)

## 2018-05-07 LAB — POCT GLYCOSYLATED HEMOGLOBIN (HGB A1C): Hemoglobin A1C: 9 % — AB (ref 4.0–5.6)

## 2018-05-07 NOTE — Patient Instructions (Signed)
Follow up visit in 3 months. Please call Dr. Fransico Michael on Sunday evening, October 27th, between 8:00-9:30 PM to discuss BGs.

## 2018-05-07 NOTE — Progress Notes (Signed)
Subjective:  Patient Name: Johnny [name] Date of Birth: [birth year]  MRN: [id number]  Johnny [name]  presents to the office today for follow-up evaluation and management  of his T1DM, hypoglycemia, autism, ADHD, possible mental retardation, goiter, thyroiditis, adjustment reaction, and family history of autoimmune thyroid disease.  HISTORY OF PRESENT ILLNESS:   Johnny [name] is a 22 y.o. Caucasian young man.   Johnny [name] was accompanied by his mother.  1. Johnny [name] was seen at [facility] in [place], [place], on 06/26/13 for complaints of weight loss, fatigue, personality change, polyuria, polydipsia, and vomiting. New-onset DM and DKA were diagnosed. He received fluids at the ED at [facility], was transferred to [facility], and was admitted to the PICU for evaluation and management of new-onset T1DM, DKA, dehydration, and ketonuria in the setting of pre-existing autism, ADHD, and possible mental retardation. Significantly abnormal lab results included: Venous pH 7.257, serum potassium 3.0, serum CO2 15, serum glucose 257, hemoglobin A1c 13.7%, C-peptide 0.19 (normal 0.80-3.09), anti-glutamic acid decarboxylase (GAD) antibody > 30 (normal < 1.0), anti-islet cell antibody 80 (normal < 5). TSH was 2.163, free T4 0.82, free T3 1.8 (normal 2.3-4.2). He was treated initially with infusions of insulin and fluids. When his DKA resolved, he was converted to a multiple daily injection (MDI) insulin plan, using Lantus as a basal insulin and Novolog aspart as a bolus insulin at mealtimes, bedtime, and 2 AM if needed. 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   2. Johnny [name] was discharged from [facility] on 07/04/13. He and his mother participated in our DSSP education program with Ms. Gearldine Bienenstock, RN. 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 When he went into the honeymoon period his Lantus dose was decreased to 6 units at night.  Since then, however, he has exited the honeymoon period and has had a gradual but progressive increase in his exogenous insulin requirement. He started his Dexcom G5 CGM in early 2016. He was converted to an Omnipod pump on 01/16/16. He was subsequently converted to a Dexcom G6.  3. Johnny [name]'s last [facility] visit was on 02/05/18. At that visit I again increased his basal rates from MN through 8 AM and again from 6 PM-midnight.  A. In the interim he had to be admitted to [facility] with DKA on 04/05/18. It appears that his pod had deteriorated. In retrospect, mom had forgotten how to use the Hyperglycemia and DKA protocols.   B. His Omnipod pump has been working well overall. His Dexcom G6 has been working well. His SGs and BGs correlate well.   Johnny [name] has become increasingly more interactive with others. His speech has improved, but varies from day to day. He is much more socially interactive. Unfortunately, however, he still remains completely dependent on his mother for all of his basic needs.   D. When he plays hard his BGs can drop rapidly.  E. His appetite for french fries, chocolate, and other carbs has increased. Mom has reduced his sugary carb intake.   4. [name]:  Constitutional: Johnny [name] feels "good - 1 thumb up".   Eyes: Vision seems to be good when he wears his new glasses. He had his last eye appointment in May 2019. The eye doctor did not see any signs of DM damage.   Neck: Johnny [name] has not been touching his neck or complaining of neck pains. There are no other recognized problems of the anterior neck.  Heart: There are no recognized heart problems. The ability to perform physical activities seems normal for his limited  level of physical activity.  Gastrointestinal: He is still very hungry. Bowel movents seem normal. There are no recognized GI problems. Legs: Muscle mass and strength seem normal. He can perform his usual ADLs and other physical activities without obvious  discomfort. He has no numbness, tingling, burning, or pains. No edema is noted.  Feet: The skin on his feet is dry. The dermatologist diagnosed psoriasis. He is now applying a prescription lotion. There are no other obvious foot problems. No edema is noted.  Neurologic: There are no recognized problems with muscle movement and strength, sensation, or coordination. Hypoglycemia: Mom says that he has had a few low BGs when he was unusually active.   5. BG meter printout: He checks his BGs 4-12 times daily, average 7.3. Average BG was 199, compared with 227 at his last visit and with 222 at his prior visit. BG range was 79-495, compared with 120-333 at his last visit and with 89-457 at his prior visit. His highest BGs occur in the late afternoon, evening, and early morning hours. His very high BGs occurred on 04/15/18 when his pod had been bad for hours. His lowest BGs occur from 8 AM-11 PM.. He had no BGs <79.   6. Dexcom printout: His average SG was 208, compared with 209 at his last visit and with 247 at his prior visit. His SGs were often higher during the night and again in the evenings. The SG pattern from noon to midnight changes every day depending upon what he eats, how much he eats, and how active he is. His SGs and BGs correlate well.   PAST MEDICAL, FAMILY, AND SOCIAL HISTORY    Past Medical History:  Diagnosis Date  . ADHD (attention deficit hyperactivity disorder)   . Allergic rhinitis   . Autism   . Diabetes mellitus without complication (HCC)     Family History  Problem Relation Age of Onset  . Hypothyroidism Mother   . Kidney disease Mother        Mom unsure what her kidney issues are  . Autism Sister   . Mental retardation Sister   . Cancer Maternal Grandfather   . Diabetes Paternal Grandmother        Died at age 70.  . Other Other        Paternal great aunt with "hypoglycemia" unsure why     Current Outpatient Medications:  .  ACCU-CHEK FASTCLIX LANCETS MISC, CHECK  BLOOD SUGAR 6 TIMES DAILY (Patient taking differently: 1 each by Other route 6 (six) times daily. ), Disp: 204 each, Rfl: 6 .  CLARAVIS 40 MG capsule, Take 40 mg by mouth daily. , Disp: , Rfl: 0 .  glucagon (GLUCAGEN HYPOKIT) 1 MG SOLR injection, Inject 1mg  IM for extreme hypoglycemia., Disp: 2 each, Rfl: 5 .  NOVOLOG 100 UNIT/ML injection, ADMINISTER 200 UNITS VIA INSULIN PUMP EVERY 48 HOURS, Disp: 40 mL, Rfl: 1 .  Urine Glucose-Ketones Test STRP, If urine output and extreme thirst increases, collect urine and check with strip as directed. Disp 1 vial, Disp: 20 each, Rfl: 3 .  diphenhydrAMINE (BENADRYL) 25 MG tablet, Take 50 mg by mouth every 6 (six) hours as needed for itching or allergies., Disp: , Rfl:  .  hydrOXYzine (ATARAX/VISTARIL) 25 MG tablet, Take 50 mg by mouth at bedtime as needed for anxiety (sleep). Reported on 12/21/2015, Disp: , Rfl:  .  insulin aspart (NOVOLOG FLEXPEN) 100 UNIT/ML FlexPen, Use up to 50 units daily in the event pump fails (Patient not taking: Reported on 05/07/2018), Disp: 15 mL, Rfl: 5 .  Insulin Glargine (LANTUS SOLOSTAR) 100 UNIT/ML Solostar Pen, Use up to 50 units daily in the event pump fails (Patient not taking: Reported on 05/07/2018), Disp: 15 mL, Rfl: 5  Allergies as of 05/07/2018  . (No Known Allergies)     reports that he has never smoked. He has never used smokeless tobacco. He reports that he does not drink alcohol. Pediatric History  Patient Guardian Status  . Mother:  [name]   Other Topics Concern  . Not on file  Social History Narrative   Lives with Mom and Sister (17y).  Mom quit smoking 18 years ago.  She has 2 other grown children.  They have a dog at home.  No recent travel.   1. School and Family: He will have another year in the 12th grade in a self-contained special education program. He has health insurance through [facility]. Mom has health insurance as well.  2. Activities: His play and video games.  3. Primary Care  Provider: His new PCP is a NP named Herbert Seta, at the [facility].  4. Psychiatrist: None at present  REVIEW OF SYSTEMS: There are no other significant problems involving Andru's other body systems.   Objective:  Vital Signs:  BP 112/68   Pulse 90   Ht 5' 6.61" (1.692 m)   Wt 152 lb 9.6 oz (69.2 kg)   BMI 24.18 kg/m    Ht Readings from Last 3 Encounters:  05/07/18 5' 6.61" (1.692 m)  04/15/18 5\' 7"  (1.702 m)  03/25/18 5\' 7"  (1.702 m)   Wt Readings from Last 3 Encounters:  05/07/18 152 lb 9.6 oz (69.2 kg)  04/16/18 149 lb 7.6 oz (67.8 kg)  03/25/18 156 lb (70.8 kg)   HC Readings from Last 3 Encounters:  No data found for [facility]   Body surface area is 1.8 meters squared.  Facility age limit for growth percentiles is 20 years. Facility age limit for growth percentiles is 20 years. Facility age limit for growth percentiles is 20 years.   PHYSICAL EXAM:  Constitutional: The patient appears healthy and well nourished. He looks great today. His height has plateaued. He lost 1 pound since last visit. He again played with his video game during most of today's clinic visit. He engaged pretty well with me today when I asked for his attention. He was again very friendly and polite. He talked more today and his answers were 1-2 words in length, but appropriate. His affect was normal today, but his insight remains fairly limited. Head: The head is normocephalic. Face: His pustular acne has resolved. There are no obvious dysmorphic features. He has a grade 2-3 mustache and a goatee today.   Eyes: The eyes appear to be normally formed and spaced. Gaze is conjugate. There is no obvious arcus or proptosis. Moisture appears normal. Ears: The ears are normally placed and appear externally normal. Mouth: The oropharynx and tongue appear normal. Dentition appears to be normal for age. Oral moisture is normal. Neck: The neck appears to be visibly normal. No carotid bruits are noted. His strap  muscles are large. The thyroid gland is again enlarged at about 22 grams in size. Today both lobes are enlarged, with the left lobe being somewhat larger. The consistency of the thyroid gland is relatively firm.  The thyroid gland is  not tender to palpation. Lungs: The lungs are clear to auscultation. Air movement is good. Heart: Heart rate and rhythm are regular. Heart sounds S1 and S2 are normal. I did not appreciate any pathologic cardiac murmurs. Abdomen: The abdomen is normal in size for the patient's age, but larger. Bowel sounds are normal. There is no obvious hepatomegaly, splenomegaly, or other mass effect.  Arms: Muscle size and bulk are normal for age. Hands: There is no obvious tremor. Phalangeal and metacarpophalangeal joints are normal. Palmar muscles are normal for age. Palmar skin is normal. Palmar moisture is also normal. Legs: Muscles appear normal for age. No edema is present. Feet: Feet are normally formed. Dorsalis pedal pulses are 1+ on the right and 1+ on the left.  I do not see any tinea pedis.  Neurologic: Strength is fairly normal for age in both the upper and lower extremities. Muscle tone is normal. Sensation to touch is normal in both legs and both feet.      LAB DATA: Results for orders placed or performed in visit on 05/07/18 (from the past 504 hour(s))  POCT Glucose (Device for Home Use)   Collection Time: 05/07/18  1:27 PM  Result Value Ref Range   Glucose Fasting, POC     POC Glucose 171 (A) 70 - 99 mg/dl  POCT glycosylated hemoglobin (Hb A1C)   Collection Time: 05/07/18  1:30 PM  Result Value Ref Range   Hemoglobin A1C 9.0 (A) 4.0 - 5.6 %   HbA1c POC (<> result, manual entry)     HbA1c, POC (prediabetic range)     HbA1c, POC (controlled diabetic range)    Results for orders placed or performed during the hospital encounter of 04/15/18 (from the past 504 hour(s))  Basic metabolic panel   Collection Time: 04/16/18  2:29 PM  Result Value Ref Range   Sodium  135 135 - 145 mmol/L   Potassium 4.1 3.5 - 5.1 mmol/L   Chloride 106 98 - 111 mmol/L   CO2 17 (L) 22 - 32 mmol/L   Glucose, Bld 198 (H) 70 - 99 mg/dL   BUN 19 6 - 20 mg/dL   Creatinine, Ser 1.61 0.61 - 1.24 mg/dL   Calcium 8.1 (L) 8.9 - 10.3 mg/dL   GFR calc non Af Amer >60 >60 mL/min   GFR calc Af Amer >60 >60 mL/min   Anion gap 12 5 - 15  TSH   Collection Time: 04/16/18  2:29 PM  Result Value Ref Range   TSH 2.081 0.350 - 4.500 uIU/mL  Glucose, capillary   Collection Time: 04/16/18  2:44 PM  Result Value Ref Range   Glucose-Capillary 188 (H) 70 - 99 mg/dL  Glucose, capillary   Collection Time: 04/16/18  3:55 PM  Result Value Ref Range   Glucose-Capillary 205 (H) 70 - 99 mg/dL  Glucose, capillary   Collection Time: 04/16/18  6:07 PM  Result Value Ref Range   Glucose-Capillary 355 (H) 70 - 99 mg/dL  ECHOCARDIOGRAM COMPLETE   Collection Time: 04/16/18  7:00 PM  Result Value Ref Range   Weight 2,391.55 oz   Height 67 in   BP 110/55 mmHg  Glucose, capillary   Collection Time: 04/16/18  7:28 PM  Result Value Ref Range   Glucose-Capillary 298 (H) 70 - 99 mg/dL  Basic metabolic panel   Collection Time: 04/16/18  7:47 PM  Result Value Ref Range   Sodium 132 (L) 135 - 145 mmol/L   Potassium 3.7 3.5 -  5.1 mmol/L   Chloride 104 98 - 111 mmol/L   CO2 21 (L) 22 - 32 mmol/L   Glucose, Bld 287 (H) 70 - 99 mg/dL   BUN 17 6 - 20 mg/dL   Creatinine, Ser 5.28 0.61 - 1.24 mg/dL   Calcium 8.1 (L) 8.9 - 10.3 mg/dL   GFR calc non Af Amer >60 >60 mL/min   GFR calc Af Amer >60 >60 mL/min   Anion gap 7 5 - 15  Glucose, capillary   Collection Time: 04/16/18  9:35 PM  Result Value Ref Range   Glucose-Capillary 150 (H) 70 - 99 mg/dL  Basic metabolic panel   Collection Time: 04/17/18 12:46 AM  Result Value Ref Range   Sodium 137 135 - 145 mmol/L   Potassium 3.5 3.5 - 5.1 mmol/L   Chloride 107 98 - 111 mmol/L   CO2 23 22 - 32 mmol/L   Glucose, Bld 162 (H) 70 - 99 mg/dL   BUN 13 6 -  20 mg/dL   Creatinine, Ser 4.13 0.61 - 1.24 mg/dL   Calcium 8.1 (L) 8.9 - 10.3 mg/dL   GFR calc non Af Amer >60 >60 mL/min   GFR calc Af Amer >60 >60 mL/min   Anion gap 7 5 - 15  Glucose, capillary   Collection Time: 04/17/18  2:33 AM  Result Value Ref Range   Glucose-Capillary 122 (H) 70 - 99 mg/dL  CBC with Differential/Platelet   Collection Time: 04/17/18  5:46 AM  Result Value Ref Range   WBC 11.1 (H) 3.8 - 10.6 K/uL   RBC 4.45 4.40 - 5.90 MIL/uL   Hemoglobin 12.9 (L) 13.0 - 18.0 g/dL   HCT 24.4 (L) 01.0 - 27.2 %   MCV 83.9 80.0 - 100.0 fL   MCH 29.0 26.0 - 34.0 pg   MCHC 34.6 32.0 - 36.0 g/dL   RDW 53.6 64.4 - 03.4 %   Platelets 235 150 - 440 K/uL   Neutrophils Relative % 80 %   Neutro Abs 8.9 (H) 1.4 - 6.5 K/uL   Lymphocytes Relative 13 %   Lymphs Abs 1.5 1.0 - 3.6 K/uL   Monocytes Relative 6 %   Monocytes Absolute 0.7 0.2 - 1.0 K/uL   Eosinophils Relative 1 %   Eosinophils Absolute 0.1 0 - 0.7 K/uL   Basophils Relative 0 %   Basophils Absolute 0.0 0 - 0.1 K/uL  Comprehensive metabolic panel   Collection Time: 04/17/18  5:46 AM  Result Value Ref Range   Sodium 138 135 - 145 mmol/L   Potassium 3.6 3.5 - 5.1 mmol/L   Chloride 106 98 - 111 mmol/L   CO2 21 (L) 22 - 32 mmol/L   Glucose, Bld 126 (H) 70 - 99 mg/dL   BUN 11 6 - 20 mg/dL   Creatinine, Ser 7.42 0.61 - 1.24 mg/dL   Calcium 8.1 (L) 8.9 - 10.3 mg/dL   Total Protein 6.3 (L) 6.5 - 8.1 g/dL   Albumin 3.4 (L) 3.5 - 5.0 g/dL   AST 18 15 - 41 U/L   ALT 18 0 - 44 U/L   Alkaline Phosphatase 76 38 - 126 U/L   Total Bilirubin 1.3 (H) 0.3 - 1.2 mg/dL   GFR calc non Af Amer >60 >60 mL/min   GFR calc Af Amer >60 >60 mL/min   Anion gap 11 5 - 15  Magnesium   Collection Time: 04/17/18  5:46 AM  Result Value Ref Range   Magnesium 1.8 1.7 -  2.4 mg/dL  Protime-INR   Collection Time: 04/17/18  5:46 AM  Result Value Ref Range   Prothrombin Time 13.6 11.4 - 15.2 seconds   INR 1.05   Glucose, capillary   Collection  Time: 04/17/18  7:46 AM  Result Value Ref Range   Glucose-Capillary 108 (H) 70 - 99 mg/dL  Glucose, capillary   Collection Time: 04/17/18 12:00 PM  Result Value Ref Range   Glucose-Capillary 168 (H) 70 - 99 mg/dL  Glucose, capillary   Collection Time: 04/17/18  4:24 PM  Result Value Ref Range   Glucose-Capillary 273 (H) 70 - 99 mg/dL  Glucose, capillary   Collection Time: 04/17/18  7:57 PM  Result Value Ref Range   Glucose-Capillary 256 (H) 70 - 99 mg/dL  Glucose, capillary   Collection Time: 04/17/18  9:11 PM  Result Value Ref Range   Glucose-Capillary 249 (H) 70 - 99 mg/dL   Comment 1 Notify RN   Glucose, capillary   Collection Time: 04/18/18  2:40 AM  Result Value Ref Range   Glucose-Capillary 190 (H) 70 - 99 mg/dL   Comment 1 Notify RN   CBC with Differential/Platelet   Collection Time: 04/18/18  5:11 AM  Result Value Ref Range   WBC 5.7 3.8 - 10.6 K/uL   RBC 4.58 4.40 - 5.90 MIL/uL   Hemoglobin 13.3 13.0 - 18.0 g/dL   HCT 16.1 (L) 09.6 - 04.5 %   MCV 83.2 80.0 - 100.0 fL   MCH 29.1 26.0 - 34.0 pg   MCHC 35.0 32.0 - 36.0 g/dL   RDW 40.9 81.1 - 91.4 %   Platelets 217 150 - 440 K/uL   Neutrophils Relative % 60 %   Neutro Abs 3.5 1.4 - 6.5 K/uL   Lymphocytes Relative 29 %   Lymphs Abs 1.6 1.0 - 3.6 K/uL   Monocytes Relative 9 %   Monocytes Absolute 0.5 0.2 - 1.0 K/uL   Eosinophils Relative 2 %   Eosinophils Absolute 0.1 0 - 0.7 K/uL   Basophils Relative 0 %   Basophils Absolute 0.0 0 - 0.1 K/uL  Basic metabolic panel   Collection Time: 04/18/18  5:11 AM  Result Value Ref Range   Sodium 139 135 - 145 mmol/L   Potassium 3.4 (L) 3.5 - 5.1 mmol/L   Chloride 103 98 - 111 mmol/L   CO2 30 22 - 32 mmol/L   Glucose, Bld 159 (H) 70 - 99 mg/dL   BUN 11 6 - 20 mg/dL   Creatinine, Ser 7.82 (L) 0.61 - 1.24 mg/dL   Calcium 8.1 (L) 8.9 - 10.3 mg/dL   GFR calc non Af Amer >60 >60 mL/min   GFR calc Af Amer >60 >60 mL/min   Anion gap 6 5 - 15  Glucose, capillary    Collection Time: 04/18/18  7:34 AM  Result Value Ref Range   Glucose-Capillary 121 (H) 70 - 99 mg/dL  Glucose, capillary   Collection Time: 04/18/18 12:03 PM  Result Value Ref Range   Glucose-Capillary 181 (H) 70 - 99 mg/dL    Labs 95/62/13: YQM5H 9.0%, CBG 171  Labs 04/16/18: TSH 2.081  Labs 04/15/18: vpH 7.07; serum CO2 9, creatinine 1.49; Urine glucose >500 and ketones 80  Labs 02/07/18: HbA1c 10.0%  Labs 02/05/18: CBG 251  Labs 12/03/17: CBG 334  Labs 09/30/17: HbA1c 8.7%, CBG 254  Labs 07/24/17: CBG 263; TSH 2.18, free T4 1.1, free T3 3.3; urine microalbumin/creatinine ration 2; CMP normal except glucose 263  Labs 05/07/17: HbA1c 9.3%, CBG 246  Labs 03/04/17: CBG 494. He had not bolused after breakfast due to mom putting in a new pod just before they left home to drive to his appointment today.  Labs 12/26/16: HbA1c 10.2%, CBG 289  Labs 09/25/16: HbA1c 9.5%, CBG 261  Labs 06/27/16: CBG 114; TSH 1.54, free T4 1.1, free T3 3.4; CMP normal; urine microalbumin/creatinine ratio 2; cholesterol 108, triglycerides 153, HDL 43, LDL 34  Labs 04/10/16: HbA1c 9.5%, CBG 145  Labs 02/01/16: CBG 391, TSH 1.72  Labs 12/21/15: HbA1c 8.4%  Labs 10/10/15: HbA1c 8.2%.  Labs 08/09/15: HbA1c 8.5%  Labs 1012/16: HbA1c 8.8%; TSH 2.624, free T4 0.99, free T3 3.4; urine microalbumin/creatinine ratio <0.2; CMP normal  Labs 01/27/15: HbA1c 8.8%  Labs 10/25/14: Hemoglobin A1c 8.2%, compared with 7.9% at the last visit and with 8.4% at the prior visit.    Labs 10/22/13: CMP normal; C-peptide 0.65, increased from 0.19 at diagnosis (normal 0.80-3.90); TSH 1.265, free T4 1.01, free T3 3.4, TPO antibody 10.4    Assessment and Plan:   ASSESSMENT:  1. T1DM:   A. Since exiting the honeymoon period, Wister has required more and more exogenous insulin than he needed before. His BGs had been much more variable over time, but have decreased a bit since his last visit. His SGs have been a bit higher.    B. He  needs a higher basal rate during the night and in the evening. His carb intake also needs to be reduced.   C. Ms. Zachery Dauer is doing an excellent job overall of taking care of Johnathin.     D. His DKA occurred when his pod went bad and mom failed to follow the Hyperglycemia and DKA protocols.  2. Hypoglycemia: He has had no BGs or SGs <79 since adjusting his insulin pump settings.   3-4. Goiter/thyroiditis:   A. He has the personal history of autoimmune T1DM. He also has the family history of autoimmune thyroid disease.  B. He continues to have waxing and waning of thyroid gland size. At his prior visit the thyroid gland was smaller overall, but it was larger at hs last visit and is still the same size today.     C. The pattern of waxing and waning of thyroid gland size and thyroid lobe sizes is c/w evolving Hashimoto's thyroiditis. It is almost a certainty that he will become hypothyroid in the future.   D.  He was euthyroid in April 2015, in October 2016, in January 2019, and again in September 2019. His TSH was mid-range euthyroid in both July and December 2017, but somewhat higher in January and September 2019.   5. Adjustment reaction: Things are going pretty well. Mom is trying very hard to take good care of Yurem. We discussed options for reducing his carb intake. 6. Autism/ADHD/ mental retardation: Mohammed's autism has improved somewhat in that he is more social and outgoing. However, he remains completely dependent upon mom for his meals, shelter, clothing, recreational activities, physical activities, and T1DM care. 7. Dehydration: He looks well hydrated today. It appears that when Fairley's BGs are high he sometimes does not drink enough liquid to meet his needs, and so becomes dehydrated.  8. Peripheral neuropathy: His neuropathy is not evident today.   PLAN:  1. Diagnostic: Continue BG checks as planned. Call on Sunday, October 27th to discuss BGs.  Call earlier if having problems.   2.  Therapeutic: Continue Small bedtime snack.   New basal rates:  MN: 1.35 -> 1.45 4 AM: 1.60 8 AM: 1.15 Noon: 0.900 -> 0.95 6 PM: 1.15 -> 1.25  Continue ISF: 50  Continue BG targets: MN: 180 6 AM: 150 9 PM: 180  Continue ICRs: MN: 15: 6 AM:12 Noon: 15  3. Patient education: We discussed his elevated BGs, his hypoglycemia, his goiter, and all of the above at length. I reviewed the Hyperglycemia and DKA protocols with mom. Mom is again concerned about when his BGs go too high and when they go too low.  I reminded her that it is our plan to tiny changes in his basal rates. I reminded her that the larger he becomes and the less insulin he produces on his own, the more insulin he will need. I asked mom to call me if Elier has any BGs during the night or in the mornings that are <80 and unexplained. I also asked her to keep a log of unusual high BGs or lower BGs and the causes. I will also ask Ms Celene Skeen to contact her to schedule an appointment for more education about the Hyperglycemia and DKA protocols.  4. Follow-up: three months  Level of Service: This visit lasted in excess of 70 minutes. More than 50% of the visit was devoted to counseling.   [name], MD, CDE Adult and Pediatric Endocrinology

## 2018-05-13 ENCOUNTER — Other Ambulatory Visit (INDEPENDENT_AMBULATORY_CARE_PROVIDER_SITE_OTHER): Payer: Medicaid Other | Admitting: *Deleted

## 2018-05-15 ENCOUNTER — Ambulatory Visit: Payer: Self-pay | Admitting: Internal Medicine

## 2018-05-24 ENCOUNTER — Other Ambulatory Visit (INDEPENDENT_AMBULATORY_CARE_PROVIDER_SITE_OTHER): Payer: Self-pay | Admitting: "Endocrinology

## 2018-05-24 DIAGNOSIS — IMO0001 Reserved for inherently not codable concepts without codable children: Secondary | ICD-10-CM

## 2018-05-24 DIAGNOSIS — E1065 Type 1 diabetes mellitus with hyperglycemia: Principal | ICD-10-CM

## 2018-07-15 ENCOUNTER — Telehealth (INDEPENDENT_AMBULATORY_CARE_PROVIDER_SITE_OTHER): Payer: Self-pay | Admitting: "Endocrinology

## 2018-07-15 NOTE — Telephone Encounter (Signed)
Johnny [name] had me paged.  1. Subjective: Johnny [name]'s Omnipod pump malfunctioned. Mom ordered a new pump that came in today. Unfortunately, she can't find the information about his pump settings.  2. Objective: Johnny [name]'s pump settings are as follows:       A. Basal rates: Midnight: 1.45 units 4 AM: 1.60 8 AM: 1.15 Noon: 0.95 6 PM: 1/25      B. Insulin Sensitivity Factor: 50      C: Insulin carbohydrate ratios:  Midnight: 15 6 AM: 12 Noon: 15      D. BG Targets: Midnight: 180 6 AM: 150 9 PM: 180      E. Active insulin time: 3 hours      F. Max bolus rate: 2 units per hour      G. Max bolus: 23 units      H. Extended Bolus: %      I. Temporary Bolus: %      J. Reverse Correction: ON     K. Low Volume Reservoir: 20 units      L. POD Expiration Alert: 2 hours 3. Plan: Johnny. Johnny [name] will enter this information into his pump. She will call me if she has problems.  Molli KnockMichael Lorine Iannaccone, MD, CDE

## 2018-07-17 ENCOUNTER — Telehealth (INDEPENDENT_AMBULATORY_CARE_PROVIDER_SITE_OTHER): Payer: Self-pay | Admitting: "Endocrinology

## 2018-07-17 NOTE — Telephone Encounter (Signed)
°  Who's calling (name and relationship to patient) : Johnny [name], mom  Best contact number: [phone]  Provider they see: Dr. Fransico MichaelBrennan  Reason for call: Request to speak to physician Sons omni pod stopped working, ordered a new one and needs assistance setting it up.   [facility] Call ID: [phone] Fransico MichaelBrennan charted     PRESCRIPTION REFILL ONLY  Name of prescription:  Pharmacy:

## 2018-07-18 ENCOUNTER — Telehealth (INDEPENDENT_AMBULATORY_CARE_PROVIDER_SITE_OTHER): Payer: Self-pay | Admitting: *Deleted

## 2018-07-18 NOTE — Telephone Encounter (Signed)
Received TC from mother Langston Reusinglaine Barnes, wanted to confirm the insulin pump settings. Now that she had received the pump she wanted to make sure that she put the settings correct on the new PDM.  Reviewed them with her and they were all correct. Mother ok with information given.

## 2018-08-07 ENCOUNTER — Ambulatory Visit (INDEPENDENT_AMBULATORY_CARE_PROVIDER_SITE_OTHER): Payer: Medicaid Other | Admitting: "Endocrinology

## 2018-08-07 VITALS — BP 112/78 | HR 80 | Ht 66.61 in | Wt 157.0 lb

## 2018-08-07 DIAGNOSIS — E049 Nontoxic goiter, unspecified: Secondary | ICD-10-CM | POA: Diagnosis not present

## 2018-08-07 DIAGNOSIS — E1065 Type 1 diabetes mellitus with hyperglycemia: Secondary | ICD-10-CM

## 2018-08-07 DIAGNOSIS — E10649 Type 1 diabetes mellitus with hypoglycemia without coma: Secondary | ICD-10-CM

## 2018-08-07 DIAGNOSIS — F840 Autistic disorder: Secondary | ICD-10-CM

## 2018-08-07 DIAGNOSIS — IMO0001 Reserved for inherently not codable concepts without codable children: Secondary | ICD-10-CM

## 2018-08-07 DIAGNOSIS — E1042 Type 1 diabetes mellitus with diabetic polyneuropathy: Secondary | ICD-10-CM | POA: Diagnosis not present

## 2018-08-07 DIAGNOSIS — E063 Autoimmune thyroiditis: Secondary | ICD-10-CM

## 2018-08-07 DIAGNOSIS — F4320 Adjustment disorder, unspecified: Secondary | ICD-10-CM

## 2018-08-07 LAB — POCT GLYCOSYLATED HEMOGLOBIN (HGB A1C): HEMOGLOBIN A1C: 8.8 % — AB (ref 4.0–5.6)

## 2018-08-07 LAB — POCT GLUCOSE (DEVICE FOR HOME USE): POC Glucose: 204 mg/dl — AB (ref 70–99)

## 2018-08-07 NOTE — Patient Instructions (Signed)
Follow up visit in 3 months. Please repat fasting lab tests about one week prior.

## 2018-08-07 NOTE — Progress Notes (Signed)
Subjective:  Patient Name: Johnny [name] Date of Birth: [birth year]  MRN: [id number]  Johnny [name]  presents to the office today for follow-up evaluation and management  of his T1DM, hypoglycemia, autism, ADHD, possible mental retardation, goiter, thyroiditis, adjustment reaction, and family history of autoimmune thyroid disease.  HISTORY OF PRESENT ILLNESS:   Johnny [name] is a 23 y.o. Caucasian young man.   [name] was accompanied by his mother.  1. Johnny [name] was seen at [facility] in [place], [place], on 06/26/13 for complaints of weight loss, fatigue, personality change, polyuria, polydipsia, and vomiting. New-onset DM and DKA were diagnosed. He received fluids at the ED at [facility], was transferred to [facility], and was admitted to the PICU for evaluation and management of new-onset T1DM, DKA, dehydration, and ketonuria in the setting of pre-existing autism, ADHD, and possible mental retardation. Significantly abnormal lab results included: Venous pH 7.257, serum potassium 3.0, serum CO2 15, serum glucose 257, hemoglobin A1c 13.7%, C-peptide 0.19 (normal 0.80-3.09), anti-glutamic acid decarboxylase (GAD) antibody > 30 (normal < 1.0), anti-islet cell antibody 80 (normal < 5). TSH was 2.163, free T4 0.82, free T3 1.8 (normal 2.3-4.2). He was treated initially with infusions of insulin and fluids. When his DKA resolved, he was converted to a multiple daily injection (MDI) insulin plan, using Lantus as a basal insulin and Novolog aspart as a bolus insulin at mealtimes, bedtime, and 2 AM if needed. 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   2. Shayaan was discharged from [facility] on 07/04/13. He and his mother participated in our DSSP education program with Ms. Gearldine Bienenstock, RN. 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 When he went into the honeymoon period his Lantus dose was decreased to 6 units at night.  Since then, however, he has exited the honeymoon period and has had a gradual but progressive increase in his exogenous insulin requirement. He started his Dexcom G5 CGM in early 2016. He was converted to an Omnipod pump on 01/16/16. He was subsequently converted to a Dexcom G6.  3. Johnny [name]'s last [facility] visit was on 05/07/18. At that visit I increased his basal rates from MN through 4 AM and again from 6 PM-midnight.  A. In the interim his Omnipod pump failed in mid-December and he had to have a new pump.  B. His new Omnipod pump has been working well overall. His Dexcom G6 has failed, so he has been without a sensor for about a month. Mom forgot to call Lorena to ask for assistance.    Johnny [name] has become increasingly more interactive with others. His speech has improved, but varies from day to day. He is much more socially interactive. Unfortunately, however, he still remains completely dependent on his mother for all of his basic needs.   D. When he gets absorbed in his X-box he often fails to eat and then develops hypoglycemia.   E. His appetite for french fries, chocolate, and other carbs has increased. Mom has reduced his sugary carb intake.  F. Family was eating out much more until a week before Xmas, but they still eat out more now than usual. His activity level is very low.    4. [name]:  Constitutional: Auren feels "good".   Eyes: Vision seems to be good when he wears his new glasses. He had his last eye appointment in May 2019. The eye doctor did not see any signs  of DM damage.   Neck: Johnny [name] has not been touching his neck or complaining of neck pains. There are no other recognized problems of the anterior neck.  Heart: There are no recognized heart problems. The ability to perform physical activities seems normal for his limited  level of physical activity.  Gastrointestinal: He is still very hungry. He sometimes has postprandial bloating. Bowel movents seem normal.  There are no recognized GI problems. Legs: Muscle mass and strength seem normal. He can perform his usual ADLs and other physical activities without obvious discomfort. He has no numbness, tingling, burning, or pains. No edema is noted.  Feet: The skin on his feet is dry. The dermatologist diagnosed psoriasis. He is now applying a prescription lotion. There are no other obvious foot problems. No edema is noted.  Neurologic: There are no recognized problems with muscle movement and strength, sensation, or coordination. Hypoglycemia: Mom says that he has had a few low BGs when he was unusually active.   5. BG meter printout: He checks his BGs 4-10 times daily, average 7.3. Average BG from 07/12/18 to 07/18/18 was 281. Average BG from 07/18/18 to the present is 174. BGs were very high during the Xmas period, but have progressively improved in the past 10 days. Around Xmas he had 10 BGs >300, one of which was >400. In the past week he has had 3 low BGs between 65-69.  Family had been consuming a larger amount of carbs during the holidays, but not as many in the past week.   PAST MEDICAL, FAMILY, AND SOCIAL HISTORY    Past Medical History:  Diagnosis Date  . ADHD (attention deficit hyperactivity disorder)   . Allergic rhinitis   . Autism   . Diabetes mellitus without complication (HCC)     Family History  Problem Relation Age of Onset  . Hypothyroidism Mother   . Kidney disease Mother        Mom unsure what her kidney issues are  . Autism Sister   . Mental retardation Sister   . Cancer Maternal Grandfather   . Diabetes Paternal Grandmother        Died at age 46.  . Other Other        Paternal great aunt with "hypoglycemia" unsure why     Current Outpatient Medications:  .  ACCU-CHEK FASTCLIX LANCETS MISC, CHECK BLOOD SUGAR 6 TIMES DAILY (Patient taking differently: 1 each by Other route 6 (six) times daily. ), Disp: 204 each, Rfl: 6 .  diphenhydrAMINE (BENADRYL) 25 MG tablet, Take 50 mg  by mouth every 6 (six) hours as needed for itching or allergies., Disp: , Rfl:  .  glucagon (GLUCAGEN HYPOKIT) 1 MG SOLR injection, Inject 1mg  IM for extreme hypoglycemia., Disp: 2 each, Rfl: 5 .  NOVOLOG 100 UNIT/ML injection, ADMINISTER 200 UNITS VIA INSULIN PUMP EVERY 48 HOURS, Disp: 40 mL, Rfl: 5 .  Urine Glucose-Ketones Test STRP, If urine output and extreme thirst increases, collect urine and check with strip as directed. Disp 1 vial, Disp: 20 each, Rfl: 3 .  CLARAVIS 40 MG capsule, Take 40 mg by mouth daily. , Disp: , Rfl: 0 .  hydrOXYzine (ATARAX/VISTARIL) 25 MG tablet, Take 50 mg by mouth at bedtime as needed for anxiety (sleep). Reported on 12/21/2015, Disp: , Rfl:  .  insulin aspart (NOVOLOG FLEXPEN) 100 UNIT/ML FlexPen, Use up to 50 units daily in the event pump fails (Patient not taking: Reported on 05/07/2018), Disp: 15 mL, Rfl: 5 .  Insulin Glargine (LANTUS SOLOSTAR) 100 UNIT/ML Solostar Pen, Use up to 50 units daily in the event pump fails (Patient not taking: Reported on 05/07/2018), Disp: 15 mL, Rfl: 5  Allergies as of 08/07/2018  . (No Known Allergies)     reports that he has never smoked. He has never used smokeless tobacco. He reports that he does not drink alcohol. Pediatric History  Patient Parents  . [name] (Mother)   Other Topics Concern  . Not on file  Social History Narrative   Lives with Mom and Sister (17y).  Mom quit smoking 18 years ago.  She has 2 other grown children.  They have a dog at home.  No recent travel.   1. School and Family: He will have another year in the 12th grade in a self-contained special education program. He has health insurance through [facility] [place]. Mom has health insurance as well.  2. Activities: His play and video games.  3. Primary Care Provider: His new PCP is a NP named Herbert Seta, at the [facility].  4. Psychiatrist: None at present  REVIEW OF SYSTEMS: There are no other significant problems involving Keean's other  body systems.   Objective:  Vital Signs:  BP 112/78   Pulse 80   Ht 5' 6.61" (1.692 m)   Wt 157 lb (71.2 kg)   BMI 24.88 kg/m    Ht Readings from Last 3 Encounters:  08/07/18 5' 6.61" (1.692 m)  05/07/18 5' 6.61" (1.692 m)  04/15/18 5\' 7"  (1.702 m)   Wt Readings from Last 3 Encounters:  08/07/18 157 lb (71.2 kg)  05/07/18 152 lb 9.6 oz (69.2 kg)  04/16/18 149 lb 7.6 oz (67.8 kg)   HC Readings from Last 3 Encounters:  No data found for [facility]   Body surface area is 1.83 meters squared.  Facility age limit for growth percentiles is 20 years. Facility age limit for growth percentiles is 20 years. Facility age limit for growth percentiles is 20 years.   PHYSICAL EXAM:  Constitutional: The patient appears healthy and well nourished. He looks great today. His height has plateaued. He gained 5 pounds since last visit. He again played with his video game during most of today's clinic visit. He engaged pretty well with me when I asked for his attention. He was again very friendly and polite. He did not talk much today. When he did talk, his answers were 1-2 words in length, but appropriate. His affect was normal today, but his insight remains fairly limited. Head: The head is normocephalic. Face: His pustular acne has resolved. There are no obvious dysmorphic features. He has a grade 2-3 mustache and a goatee today.   Eyes: The eyes appear to be normally formed and spaced. Gaze is conjugate. There is no obvious arcus or proptosis. Moisture appears normal. Ears: The ears are normally placed and appear externally normal. Mouth: The oropharynx and tongue appear normal. Dentition appears to be normal for age. Oral moisture is normal. Neck: The neck appears to be visibly normal. No carotid bruits are noted. His strap muscles are large. The thyroid gland is again enlarged, but a bit smaller at about 21-22 grams in size. Today both lobes are enlarged, with the left lobe being somewhat larger. The  consistency of the thyroid gland is relatively firm.  The thyroid gland is not tender to palpation. Lungs: The lungs are clear to auscultation. Air movement is good. Heart: Heart rate and rhythm are regular. Heart sounds S1 and S2  are normal. I did not appreciate any pathologic cardiac murmurs. Abdomen: The abdomen is normal in size for the patient's age, but larger. Bowel sounds are normal. There is no obvious hepatomegaly, splenomegaly, or other mass effect.  Arms: Muscle size and bulk are normal for age. Hands: There is no obvious tremor. Phalangeal and metacarpophalangeal joints are normal. Palmar muscles are normal for age. Palmar skin is normal. Palmar moisture is also normal. Legs: Muscles appear normal for age. No edema is present. Feet: Feet are normally formed. Dorsalis pedal pulses are 1+ on the right and 2+ on the left.  I do not see any tinea pedis.  Neurologic: Strength is fairly normal for age in both the upper and lower extremities. Muscle tone is normal. Sensation to touch is normal in both legs and both feet.      LAB DATA: Results for orders placed or performed in visit on 08/07/18 (from the past 504 hour(s))  POCT Glucose (Device for Home Use)   Collection Time: 08/07/18  2:11 PM  Result Value Ref Range   Glucose Fasting, POC     POC Glucose 204 (A) 70 - 99 mg/dl  POCT glycosylated hemoglobin (Hb A1C)   Collection Time: 08/07/18  2:19 PM  Result Value Ref Range   Hemoglobin A1C 8.8 (A) 4.0 - 5.6 %   HbA1c POC (<> result, manual entry)     HbA1c, POC (prediabetic range)     HbA1c, POC (controlled diabetic range)      Labs 08/07/18: HbA1c 8.8%, CBG 204  Labs 05/07/18: HbA1c 9.0%, CBG 171  Labs 04/16/18: TSH 2.081  Labs 04/15/18: vpH 7.07; serum CO2 9, creatinine 1.49; Urine glucose >500 and ketones 80  Labs 02/07/18: HbA1c 10.0%  Labs 02/05/18: CBG 251  Labs 12/03/17: CBG 334  Labs 09/30/17: HbA1c 8.7%, CBG 254  Labs 07/24/17: CBG 263; TSH 2.18, free T4 1.1, free T3  3.3; urine microalbumin/creatinine ration 2; CMP normal except glucose 263  Labs 05/07/17: HbA1c 9.3%, CBG 246  Labs 03/04/17: CBG 494. He had not bolused after breakfast due to mom putting in a new pod just before they left home to drive to his appointment today.  Labs 12/26/16: HbA1c 10.2%, CBG 289  Labs 09/25/16: HbA1c 9.5%, CBG 261  Labs 06/27/16: CBG 114; TSH 1.54, free T4 1.1, free T3 3.4; CMP normal; urine microalbumin/creatinine ratio 2; cholesterol 108, triglycerides 153, HDL 43, LDL 34  Labs 04/10/16: HbA1c 9.5%, CBG 145  Labs 02/01/16: CBG 391, TSH 1.72  Labs 12/21/15: HbA1c 8.4%  Labs 10/10/15: HbA1c 8.2%.  Labs 08/09/15: HbA1c 8.5%  Labs 1012/16: HbA1c 8.8%; TSH 2.624, free T4 0.99, free T3 3.4; urine microalbumin/creatinine ratio <0.2; CMP normal  Labs 01/27/15: HbA1c 8.8%  Labs 10/25/14: Hemoglobin A1c 8.2%, compared with 7.9% at the last visit and with 8.4% at the prior visit.    Labs 10/22/13: CMP normal; C-peptide 0.65, increased from 0.19 at diagnosis (normal 0.80-3.90); TSH 1.265, free T4 1.01, free T3 3.4, TPO antibody 10.4    Assessment and Plan:   ASSESSMENT:  1. T1DM:   A. Since exiting the honeymoon period, Antonyo has required more and more exogenous insulin than he needed before. His BGs had been much more variable over time, but have decreased more in the past week.    B. His new basal rate settings have helped to control his BGs. He often has inaccurate carb counts at dinner, especially if the family eats out. His carb intake also needs to be  reduced.   C. Ms. Zachery DauerBarnes is doing a very good job overall of taking care of [name].     2. Hypoglycemia: He has had 3 BGs  <70 in the past week.    3-4. Goiter/thyroiditis:   A. He has the personal history of autoimmune T1DM. He also has the family history of autoimmune thyroid disease.  B. He continues to have waxing and waning of thyroid gland size. At his second prior visit the thyroid gland was smaller overall, but  it was larger at his prior visit and at his last visit. The goiter was smaller today.   C. The pattern of waxing and waning of thyroid gland size and thyroid lobe sizes is c/w evolving Hashimoto's thyroiditis. It is almost a certainty that he will become hypothyroid in the future.   D.  He was euthyroid in April 2015, in October 2016, in January 2019, and again in September 2019. His TSH was mid-range euthyroid in both July and December 2017, but somewhat higher in January and September 2019.   5. Adjustment reaction: Things are going pretty well. Mom is trying very hard to take good care of [name]. We discussed options for reducing his carb intake. 6. Autism/ADHD/ mental retardation: Garnet's autism has improved somewhat in that he is more social and outgoing. However, he remains completely dependent upon mom for his meals, shelter, clothing, recreational activities, physical activities, and T1DM care. 7. Dehydration: He looks well hydrated today. It appears that when Stanton's BGs are high he sometimes does not drink enough liquid to meet his needs, and so becomes dehydrated.  8. Peripheral neuropathy: His neuropathy is not evident today.   PLAN:  1. Diagnostic: Continue BG checks as planned. Check with Lorena for assistance with getting his sensor transmitter. 2. Therapeutic: Continue Small bedtime snack.   Continue basal rates: MN: 1.45 4 AM: 1.60 8 AM: 1.15 Noon: 0.95 6 PM: 1.25  Continue ISF: 50  Continue BG targets: MN: 180 6 AM: 150 9 PM: 180  Continue ICRs: MN: 15: 6 AM:12 Noon: 15  3. Patient education: We discussed his elevated BGs, his hypoglycemia, his goiter, and all of the above at length.  Mom is again concerned about when his BGs go too high and when they go too low.  I reminded her that the larger he becomes and the less insulin he produces on his own, the more insulin he will need. I asked mom to call me if [name] has any BGs during the night or in the mornings that  are <80 and unexplained. I also asked her to keep a log of unusual high BGs or lower BGs and the causes.  4. Follow-up: three months  Level of Service: This visit lasted in excess of 65 minutes. More than 50% of the visit was devoted to counseling.   Molli KnockMichael Loriel Diehl, MD, CDE Adult and Pediatric Endocrinology

## 2018-08-14 ENCOUNTER — Telehealth (INDEPENDENT_AMBULATORY_CARE_PROVIDER_SITE_OTHER): Payer: Self-pay | Admitting: *Deleted

## 2018-08-14 NOTE — Telephone Encounter (Signed)
Received TC from [place] from [facility] to state that Elward's trasnmitter was sent and delivered 08/08/2018 and they received a confirmation that it was delivered.

## 2018-09-25 ENCOUNTER — Encounter: Payer: Self-pay | Admitting: Nurse Practitioner

## 2018-09-25 ENCOUNTER — Ambulatory Visit (INDEPENDENT_AMBULATORY_CARE_PROVIDER_SITE_OTHER): Payer: Medicaid Other | Admitting: Nurse Practitioner

## 2018-09-25 VITALS — BP 105/65 | HR 80 | Resp 16 | Ht 66.75 in | Wt 162.0 lb

## 2018-09-25 DIAGNOSIS — J309 Allergic rhinitis, unspecified: Secondary | ICD-10-CM

## 2018-09-25 DIAGNOSIS — F840 Autistic disorder: Secondary | ICD-10-CM | POA: Diagnosis not present

## 2018-09-25 DIAGNOSIS — L700 Acne vulgaris: Secondary | ICD-10-CM | POA: Diagnosis not present

## 2018-09-25 DIAGNOSIS — E1065 Type 1 diabetes mellitus with hyperglycemia: Secondary | ICD-10-CM

## 2018-09-25 NOTE — Progress Notes (Signed)
[facility] [address] [place], [place] [address]  Internal MEDICINE  Office Visit Note  Patient Name: Johnny [name]  [id number]  [id number]  Date of Service: 10/08/2018  Chief Complaint  Patient presents with  . Medical Management of Chronic Issues  . Diabetes    The patient is here for routine follow up exam. Has long history of cystic acne on his face. To a lesser degree, he has this on back, shoulders, and chest. Dermatologist recently started new treatment which is working very well. Reports chapped lips as side effect, but no other negative effects. Type 1 diabetes manged per endocrinology and well controlled per patient's mother.       Current Medication: Outpatient Encounter Medications as of 09/25/2018  Medication Sig  . ACCU-CHEK FASTCLIX LANCETS MISC CHECK BLOOD SUGAR 6 TIMES DAILY (Patient taking differently: 1 each by Other route 6 (six) times daily. )  . diphenhydrAMINE (BENADRYL) 25 MG tablet Take 50 mg by mouth every 6 (six) hours as needed for itching or allergies.  Marland Kitchen glucagon (GLUCAGEN HYPOKIT) 1 MG SOLR injection Inject 1mg  IM for extreme hypoglycemia.  Marland Kitchen insulin aspart (NOVOLOG FLEXPEN) 100 UNIT/ML FlexPen Use up to 50 units daily in the event pump fails  . Insulin Glargine (LANTUS SOLOSTAR) 100 UNIT/ML Solostar Pen Use up to 50 units daily in the event pump fails  . NOVOLOG 100 UNIT/ML injection ADMINISTER 200 UNITS VIA INSULIN PUMP EVERY 48 HOURS  . Urine Glucose-Ketones Test STRP If urine output and extreme thirst increases, collect urine and check with strip as directed. Disp 1 vial  . [DISCONTINUED] CLARAVIS 40 MG capsule Take 40 mg by mouth daily.   . [DISCONTINUED] hydrOXYzine (ATARAX/VISTARIL) 25 MG tablet Take 50 mg by mouth at bedtime as needed for anxiety (sleep). Reported on 12/21/2015   No facility-administered encounter medications on file as of 09/25/2018.     Surgical History: Past Surgical History:  Procedure Laterality Date  .  DENTAL REHABILITATION      Medical History: Past Medical History:  Diagnosis Date  . ADHD (attention deficit hyperactivity disorder)   . Allergic rhinitis   . Autism   . Diabetes mellitus without complication (HCC)     Family History: Family History  Problem Relation Age of Onset  . Hypothyroidism Mother   . Kidney disease Mother        Mom unsure what her kidney issues are  . Autism Sister   . Mental retardation Sister   . Cancer Maternal Grandfather   . Diabetes Paternal Grandmother        Died at age 2.  . Other Other        Paternal great aunt with "hypoglycemia" unsure why    Social History   Socioeconomic History  . Marital status: Single    Spouse name: Not on file  . Number of children: Not on file  . Years of education: Not on file  . Highest education level: Not on file  Occupational History  . Not on file  Social Needs  . Financial resource strain: Not on file  . Food insecurity:    Worry: Not on file    Inability: Not on file  . Transportation needs:    Medical: Not on file    Non-medical: Not on file  Tobacco Use  . Smoking status: Never Smoker  . Smokeless tobacco: Never Used  Substance and Sexual Activity  . Alcohol use: No  . Drug use: Not on file  .  Sexual activity: Not on file  Lifestyle  . Physical activity:    Days per week: Not on file    Minutes per session: Not on file  . Stress: Not on file  Relationships  . Social connections:    Talks on phone: Not on file    Gets together: Not on file    Attends religious service: Not on file    Active member of club or organization: Not on file    Attends meetings of clubs or organizations: Not on file    Relationship status: Not on file  . Intimate partner violence:    Fear of current or ex partner: Not on file    Emotionally abused: Not on file    Physically abused: Not on file    Forced sexual activity: Not on file  Other Topics Concern  . Not on file  Social History Narrative    Lives with Mom and Sister (17y).  Mom quit smoking 18 years ago.  She has 2 other grown children.  They have a dog at home.  No recent travel.      Review of Systems  Constitutional: Negative for chills, fatigue and unexpected weight change.  HENT: Negative for congestion, postnasal drip, rhinorrhea, sneezing and sore throat.   Eyes: Negative for redness.  Respiratory: Negative for cough, chest tightness and shortness of breath.   Cardiovascular: Negative for chest pain and palpitations.  Gastrointestinal: Negative for abdominal pain, constipation, diarrhea, nausea and vomiting.  Endocrine: Negative for cold intolerance, polydipsia and polyuria.       Patient on insulin pump due to type 1 diabetes. Followed by endocrinology.  Musculoskeletal: Negative for arthralgias, back pain, joint swelling and neck pain.  Skin: Negative for rash.       Cystic acne, mostly on his face. Improving with new treatment.   Allergic/Immunologic: Positive for environmental allergies and food allergies.  Neurological: Negative for dizziness, tremors, numbness and headaches.  Hematological: Negative for adenopathy. Does not bruise/bleed easily.  Psychiatric/Behavioral: Negative for behavioral problems (Depression), sleep disturbance and suicidal ideas. The patient is nervous/anxious.     Today's Vitals   09/25/18 1518  BP: 105/65  Pulse: 80  Resp: 16  SpO2: 97%  Weight: 162 lb (73.5 kg)  Height: 5' 6.75" (1.695 m)   Body mass index is 25.56 kg/m.  Physical Exam Vitals signs and nursing note reviewed.  Constitutional:      General: He is not in acute distress.    Appearance: Normal appearance. He is well-developed. He is not diaphoretic.  HENT:     Head: Normocephalic and atraumatic.     Mouth/Throat:     Pharynx: No oropharyngeal exudate.  Eyes:     Pupils: Pupils are equal, round, and reactive to light.  Neck:     Musculoskeletal: Normal range of motion and neck supple.     Thyroid: No  thyromegaly.     Vascular: No JVD.     Trachea: No tracheal deviation.  Cardiovascular:     Rate and Rhythm: Normal rate and regular rhythm.     Heart sounds: Normal heart sounds. No murmur. No friction rub. No gallop.   Pulmonary:     Effort: Pulmonary effort is normal. No respiratory distress.     Breath sounds: Normal breath sounds. No wheezing or rales.  Chest:     Chest wall: No tenderness.  Abdominal:     General: Bowel sounds are normal.     Palpations: Abdomen is soft.  Musculoskeletal: Normal range of motion.  Lymphadenopathy:     Cervical: No cervical adenopathy.  Skin:    General: Skin is warm and dry.     Comments: Much improved facial acne.   Neurological:     Mental Status: He is alert and oriented to person, place, and time.     Cranial Nerves: No cranial nerve deficit.     Comments: Patient is at his neurological baseline.   Psychiatric:        Mood and Affect: Mood is anxious.        Behavior: Behavior normal.        Thought Content: Thought content normal.        Judgment: Judgment normal.   Assessment/Plan: 1. Acne vulgaris Continue regular visits with dermatology as scheduled.   2. Uncontrolled type 1 diabetes mellitus with hyperglycemia [facility] Endocrinology visits as scheduled.   3. Chronic allergic rhinitis Continue all allergy medication as prescribed   4. Autistic disorder, active stable  General Counseling: Cray verbalizes understanding of the findings of todays visit and agrees with plan of treatment. I have discussed any further diagnostic evaluation that may be needed or ordered today. We also reviewed his medications today. he has been encouraged to call the office with any questions or concerns that should arise related to todays visit.   This patient was seen by Vincent Gros FNP Collaboration with Dr Lyndon Code as a part of collaborative care agreement   Time spent: 15 Minutes      Dr Lyndon Code Internal medicine

## 2018-09-29 ENCOUNTER — Telehealth (INDEPENDENT_AMBULATORY_CARE_PROVIDER_SITE_OTHER): Payer: Self-pay | Admitting: "Endocrinology

## 2018-09-29 NOTE — Telephone Encounter (Signed)
°  Who's calling (name and relationship to patient) : Monico Hoar ([employer] Rep)  Best contact number: [phone] Provider they see: Dr. Fransico Michael Reason for call: Monico Hoar calling to follow up on Dexcom logs request.

## 2018-09-30 NOTE — Telephone Encounter (Signed)
Taken care by Kassina 

## 2018-11-10 ENCOUNTER — Other Ambulatory Visit: Payer: Self-pay

## 2018-11-10 ENCOUNTER — Ambulatory Visit (INDEPENDENT_AMBULATORY_CARE_PROVIDER_SITE_OTHER): Payer: Medicaid Other | Admitting: "Endocrinology

## 2018-11-10 DIAGNOSIS — E063 Autoimmune thyroiditis: Secondary | ICD-10-CM | POA: Diagnosis not present

## 2018-11-10 DIAGNOSIS — E1065 Type 1 diabetes mellitus with hyperglycemia: Secondary | ICD-10-CM | POA: Diagnosis not present

## 2018-11-10 DIAGNOSIS — E10649 Type 1 diabetes mellitus with hypoglycemia without coma: Secondary | ICD-10-CM | POA: Diagnosis not present

## 2018-11-10 DIAGNOSIS — E049 Nontoxic goiter, unspecified: Secondary | ICD-10-CM | POA: Diagnosis not present

## 2018-11-10 NOTE — Progress Notes (Signed)
Subjective:  Patient Name: Johnny [name] Date of Birth: 1996-04-28  MRN: [id number]  Johnny [name]  presents at today's Webex visit for follow-up evaluation and management  of his T1DM, hypoglycemia, autism, ADHD, possible mental retardation, goiter, thyroiditis, adjustment reaction, and family history of autoimmune thyroid disease.  HISTORY OF PRESENT ILLNESS:   Johnny [name] is a 23 y.o. Caucasian young man.   Johnny [name] was accompanied by his mother.  1. Johnny [name] was seen at [facility] in [place], [place], on 06/26/13 for complaints of weight loss, fatigue, personality change, polyuria, polydipsia, and vomiting. New-onset DM and DKA were diagnosed. He received fluids at the ED at [facility], was transferred to [facility], and was admitted to the PICU for evaluation and management of new-onset T1DM, DKA, dehydration, and ketonuria in the setting of pre-existing autism, ADHD, and possible mental retardation. Significantly abnormal lab results included: Venous pH 7.257, serum potassium 3.0, serum CO2 15, serum glucose 257, hemoglobin A1c 13.7%, C-peptide 0.19 (normal 0.80-3.09), anti-glutamic acid decarboxylase (GAD) antibody > 30 (normal < 1.0), anti-islet cell antibody 80 (normal < 5). TSH was 2.163, free T4 0.82, free T3 1.8 (normal 2.3-4.2). He was treated initially with infusions of insulin and fluids. When his DKA resolved, he was converted to a multiple daily injection (MDI) insulin plan, using Lantus as a basal insulin and Novolog aspart as a bolus insulin at mealtimes, bedtime, and 2 AM if needed. 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   2. Johnny [name] was discharged from [facility] on 07/04/13. He and his mother participated in our DSSP education program with Ms. Gearldine Bienenstock, RN. 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 When he went into the honeymoon period his Lantus dose was decreased to 6 units at night.  Since then, however, he has exited the honeymoon period and has had a gradual but progressive increase in his exogenous insulin requirement. He started his Dexcom G5 CGM in early 2016. He was converted to an Omnipod pump on 01/16/16. He was subsequently converted to a Dexcom G6.  3. Johnny [name]'s last [facility] visit was on 08/07/18. At that visit I continued all of his pump settings.   A. In the interim he has been healthy. His allergies are acting up.   B. His BGs have been lower at times, usually if he goes to bed late and sleeps in too late.   C. His Omnipod pump has been working well overall. His Dexcom G6 has not been as accurate as before.     Johnny [name] has not had many social interactions since he has been in the stay at home mode. He still remains completely dependent on his mother for all of his basic needs.   E. When he gets absorbed in his X-box he often fails to eat and then develops hypoglycemia.   F. His appetite is good. He likes his french fries, chocolate, and other carbs. Mom has reduced his sugary carb intake.  G. Family is eating all meals at home now. His activity level is very low.    4. [name]:  Constitutional: Johnny [name] feels "good, happy ".  He does not miss school, but misses shopping. Eyes: Vision seems to be good when he wears his glasses. He had his last eye appointment in May 2019. The eye doctor did not see any signs of DM damage.   Neck: Johnny [name] has not been touching his neck or complaining  of neck pains. There are no other recognized problems of the anterior neck.  Heart: There are no recognized heart problems. The ability to perform physical activities seems normal for his limited  level of physical activity.  Gastrointestinal: He is still very hungry. He sometimes has postprandial bloating. Bowel movents seem normal. There are no recognized GI problems. Legs: Muscle mass and strength seem normal. He can perform his usual ADLs and other physical activities  without obvious discomfort. He has no numbness, tingling, burning, or pains. No edema is noted.  Feet: The skin on his feet is dry. The dermatologist diagnosed psoriasis. He is now applying a prescription lotion. There are no other obvious foot problems. No edema is noted.  Neurologic: There are no recognized problems with muscle movement and strength, sensation, or coordination. Hypoglycemia: Mom says that he has had a few low SGs in the 60s-80s when he sleeps in late.    5. BG log: Date  Early Am Breakfast Lunch  Dinner  Bedtime 11/07/18:    199  146  107  294 - Nachos, other items for dinner 11/08/18:    132  234  243  187 11/09/18:    67  187  127  120  164 11/10/18:    219  183  PAST MEDICAL, FAMILY, AND SOCIAL HISTORY    Past Medical History:  Diagnosis Date  . ADHD (attention deficit hyperactivity disorder)   . Allergic rhinitis   . Autism   . Diabetes mellitus without complication (HCC)     Family History  Problem Relation Age of Onset  . Hypothyroidism Mother   . Kidney disease Mother        Mom unsure what her kidney issues are  . Autism Sister   . Mental retardation Sister   . Cancer Maternal Grandfather   . Diabetes Paternal Grandmother        Died at age 37.  . Other Other        Paternal great aunt with "hypoglycemia" unsure why     Current Outpatient Medications:  .  glucagon (GLUCAGEN HYPOKIT) 1 MG SOLR injection, Inject  IM for extreme hypoglycemia., Disp: 2 each, Rfl: 5 .  NOVOLOG 100 UNIT/ML injection, ADMINISTER 200 UNITS VIA INSULIN PUMP EVERY 48 HOURS, Disp: 40 mL, Rfl: 5 .  Urine Glucose-Ketones Test STRP, If urine output and extreme thirst increases, collect urine and check with strip as directed. Disp 1 vial, Disp: 20 each, Rfl: 3 .  ACCU-CHEK FASTCLIX LANCETS MISC, CHECK BLOOD SUGAR 6 TIMES DAILY (Patient not taking: No sig reported), Disp: 204 each, Rfl: 6 .  diphenhydrAMINE (BENADRYL) 25 MG tablet, Take 50 mg by mouth every 6 (six) hours as needed  for itching or allergies., Disp: , Rfl:  .  insulin aspart (NOVOLOG FLEXPEN) 100 UNIT/ML FlexPen, Use up to 50 units daily in the event pump fails (Patient not taking: Reported on 11/10/2018), Disp: 15 mL, Rfl: 5 .  Insulin Glargine (LANTUS SOLOSTAR) 100 UNIT/ML Solostar Pen, Use up to 50 units daily in the event pump fails (Patient not taking: Reported on 11/10/2018), Disp: 15 mL, Rfl: 5  Allergies as of 11/10/2018  . (No Known Allergies)     reports that he has never smoked. He has never used smokeless tobacco. He reports that he does not drink alcohol. Pediatric History  Patient Parents  . Johnny [name] (Mother)   Other Topics Concern  . Not on file  Social History Narrative   Lives with Mom  and Sister (17y).  Mom quit smoking 18 years ago.  She has 2 other grown children.  They have a dog at home.  No recent travel.   1. School and Family: He will have another year in the 12th grade in a self-contained special education program. He has health insurance through [facility]. Mom has health insurance as well.  2. Activities: His play and video games.  3. Primary Care Provider: His new PCP is a NP named Vincent Gros, at the [facility].  4. Psychiatrist: None at present  REVIEW OF SYSTEMS: There are no other significant problems involving Ruger's other body systems.   Objective:   LAB DATA:  No results found for this or any previous visit (from the past 504 hour(s)).   Labs 08/07/18: HbA1c 8.8%, CBG 204  Labs 05/07/18: HbA1c 9.0%, CBG 171  Labs 04/16/18: TSH 2.081  Labs 04/15/18: vpH 7.07; serum CO2 9, creatinine 1.49; Urine glucose >500 and ketones 80  Labs 02/07/18: HbA1c 10.0%  Labs 02/05/18: CBG 251  Labs 12/03/17: CBG 334  Labs 09/30/17: HbA1c 8.7%, CBG 254  Labs 07/24/17: CBG 263; TSH 2.18, free T4 1.1, free T3 3.3; urine microalbumin/creatinine ration 2; CMP normal except glucose 263  Labs 05/07/17: HbA1c 9.3%, CBG 246  Labs 03/04/17: CBG 494. He had not  bolused after breakfast due to mom putting in a new pod just before they left home to drive to his appointment today.  Labs 12/26/16: HbA1c 10.2%, CBG 289  Labs 09/25/16: HbA1c 9.5%, CBG 261  Labs 06/27/16: CBG 114; TSH 1.54, free T4 1.1, free T3 3.4; CMP normal; urine microalbumin/creatinine ratio 2; cholesterol 108, triglycerides 153, HDL 43, LDL 34  Labs 04/10/16: HbA1c 9.5%, CBG 145  Labs 02/01/16: CBG 391, TSH 1.72  Labs 12/21/15: HbA1c 8.4%  Labs 10/10/15: HbA1c 8.2%.  Labs 08/09/15: HbA1c 8.5%  Labs 1012/16: HbA1c 8.8%; TSH 2.624, free T4 0.99, free T3 3.4; urine microalbumin/creatinine ratio <0.2; CMP normal  Labs 01/27/15: HbA1c 8.8%  Labs 10/25/14: Hemoglobin A1c 8.2%, compared with 7.9% at the last visit and with 8.4% at the prior visit.    Labs 10/22/13: CMP normal; C-peptide 0.65, increased from 0.19 at diagnosis (normal 0.80-3.90); TSH 1.265, free T4 1.01, free T3 3.4, TPO antibody 10.4    Assessment and Plan:   ASSESSMENT:  1. T1DM:   A. Since exiting the honeymoon period, Jona has required more and more exogenous insulin than he needed before. His BGs had been much more variable over time, but have decreased more recently, in part due to not eating out. .   B. His current basal rate settings and bolus settings have helped to control his BGs. He often has inaccurate carb counts at certain meals.    C. Ms. Zachery Dauer is doing a very good job overall of taking care of Rashied.     2-3. Hypoglycemia/hypoglycemia unawareness:  A.  He has had 1 BG <70 in the past three days. He has had several 70s-80s in the past several weeks.  Johnny [name] sometimes feels low BG symptoms, but sometimes not.  4-5. Goiter/thyroiditis:   A. He has the personal history of autoimmune T1DM. He also has the family history of autoimmune thyroid disease.  B. He continues to have waxing and waning of thyroid gland size. At his second prior visit the thyroid gland was smaller overall, but it was larger at  his prior visit and at his last visit. The goiter was smaller today.  C. The pattern of waxing and waning of thyroid gland size and thyroid lobe sizes is c/w evolving Hashimoto's thyroiditis. It is almost a certainty that he will become hypothyroid in the future.   D.  He was euthyroid in April 2015, in October 2016, in January 2019, and again in September 2019. His TSH was mid-range euthyroid in both July and December 2017, but somewhat higher in January and September 2019.    E. He seems to be clinically euthyroid now.  6. Adjustment reaction: Things are going pretty well. Mom is trying very hard to take good care of Johnny [name]. We discussed options for reducing his carb intake. 7. Autism/ADHD/ mental retardation: Mclane's autism has improved somewhat in that he is more social and outgoing. However, he remains completely dependent upon mom for his meals, shelter, clothing, recreational activities, physical activities, and T1DM care. 8. Dehydration: He has not been drinking a lot, except at meals. When Arwin's BGs are high he sometimes does not drink enough liquid to meet his needs, and so becomes dehydrated.  9. Peripheral neuropathy: His neuropathy was not evident at his last visit.    PLAN:  1. Diagnostic: Continue BG checks as planned. Bring in the meter and CGM when it feels safe to do so.  2. Therapeutic: Continue Small bedtime snack.   Continue basal rates: MN: 1.45 4 AM: 1.60 8 AM: 1.15 Noon: 0.95 6 PM: 1.25  Continue ISF: 50  Continue BG targets: MN: 180 6 AM: 150 9 PM: 180  Continue ICRs: MN: 15: 6 AM:12 Noon: 15  3. Patient education: We discussed his elevated BGs, his hypoglycemia, and all of the above at length.  Mom is again concerned about when his BGs go too high and when they go too low.  I reminded her that the larger he becomes and the less insulin he produces on his own, the more insulin he will need. I asked mom to call me if Johnny [name] has any BGs during the night or  in the mornings that are <80 and unexplained. I also asked her to keep a log of unusual high BGs or lower BGs and the causes.  4. Follow-up: three months  Level of Service: This visit lasted in excess of 40 minutes. More than 50% of the visit was devoted to counseling.   Molli KnockMichael Brennan, MD, CDE Adult and Pediatric Endocrinology   This is a Pediatric Specialist E-Visit follow up consult provided via WebEx Lorre MunroeAndrew Wignall and his mother, Ms. Johnny ReusingElaine Barnes, consented to an E-Visit consult today.  Location of patient: Johnny [name] is at home. Location of provider: Armanda MagicMichael Brennan,MD is at the PS clinic office.  Patient was referred by Carlean JewsBoscia, Heather E, NP   The following participants were involved in this E-Visit: Johnny CastillaAndrew, Ms. Zachery DauerBarnes, Dr. Fransico MichaelBrennan  Chief Complain/ Reason for E-Visit today: T1DM, hypoglycemia, goiter, thyroiditis Total time on call: 40 Follow up: 3 months

## 2018-11-10 NOTE — Patient Instructions (Signed)
Follow up visit in 3 months. 

## 2018-11-12 ENCOUNTER — Encounter: Payer: Self-pay | Admitting: Nurse Practitioner

## 2019-02-06 ENCOUNTER — Other Ambulatory Visit (INDEPENDENT_AMBULATORY_CARE_PROVIDER_SITE_OTHER): Payer: Self-pay | Admitting: *Deleted

## 2019-02-06 ENCOUNTER — Telehealth (INDEPENDENT_AMBULATORY_CARE_PROVIDER_SITE_OTHER): Payer: Self-pay | Admitting: "Endocrinology

## 2019-02-06 MED ORDER — DEXCOM G6 RECEIVER DEVI: 1.0000 | 0 refills | Status: DC | PRN

## 2019-02-06 MED ORDER — DEXCOM G6 TRANSMITTER MISC: 1.0000 | 5 refills | Status: DC | PRN

## 2019-02-06 MED ORDER — DEXCOM G6 SENSOR MISC: 1.0000 | 5 refills | Status: DC | PRN

## 2019-02-06 NOTE — Telephone Encounter (Signed)
Script sent  

## 2019-02-06 NOTE — Telephone Encounter (Signed)
°  Who's calling (name and relationship to patient) : Sonia Baller Warehouse manager)  Best contact number: [phone] Provider they see: Dr. Tobe Sos Reason for call: Pharm requesting rx for [facility] receiver, transmitter, and sensors for pt.      PRESCRIPTION REFILL ONLY  Name of prescription: Dexcom G6 receiver, transmitter, and sensors Pharmacy: [facility]

## 2019-02-11 ENCOUNTER — Ambulatory Visit (INDEPENDENT_AMBULATORY_CARE_PROVIDER_SITE_OTHER): Payer: Medicaid Other | Admitting: "Endocrinology

## 2019-02-11 ENCOUNTER — Other Ambulatory Visit: Payer: Self-pay

## 2019-02-11 DIAGNOSIS — E1065 Type 1 diabetes mellitus with hyperglycemia: Secondary | ICD-10-CM

## 2019-02-11 DIAGNOSIS — IMO0001 Reserved for inherently not codable concepts without codable children: Secondary | ICD-10-CM

## 2019-02-11 DIAGNOSIS — E063 Autoimmune thyroiditis: Secondary | ICD-10-CM | POA: Diagnosis not present

## 2019-02-11 DIAGNOSIS — E049 Nontoxic goiter, unspecified: Secondary | ICD-10-CM

## 2019-02-11 DIAGNOSIS — E10649 Type 1 diabetes mellitus with hypoglycemia without coma: Secondary | ICD-10-CM | POA: Diagnosis not present

## 2019-02-11 DIAGNOSIS — E1042 Type 1 diabetes mellitus with diabetic polyneuropathy: Secondary | ICD-10-CM

## 2019-02-11 DIAGNOSIS — F4320 Adjustment disorder, unspecified: Secondary | ICD-10-CM

## 2019-02-11 NOTE — Patient Instructions (Signed)
Follow up visit in 2 months.  

## 2019-02-11 NOTE — Progress Notes (Signed)
Subjective:  Patient Name: Johnny [name] Date of Birth: January 07, 1996  MRN: [id number]  Johnny [name]  presents at today's televisit for follow-up evaluation and management  of his T1DM, hypoglycemia, autism, ADHD, possible mental retardation, goiter, thyroiditis, adjustment reaction, and family history of autoimmune thyroid disease.  HISTORY OF PRESENT ILLNESS:   Johnny [name] is a 23 y.o. Caucasian young man.   Johnny [name] was accompanied by his mother.  66. Johnny [name] was seen at [facility] in [place], [place], on 06/26/13 for complaints of weight loss, fatigue, personality change, polyuria, polydipsia, and vomiting. New-onset DM and DKA were diagnosed. He received fluids at the ED at [facility], was transferred to [facility], and was admitted to the PICU for evaluation and management of new-onset T1DM, DKA, dehydration, and ketonuria in the setting of pre-existing autism, ADHD, and possible mental retardation. Significantly abnormal lab results included: Venous pH 7.257, serum potassium 3.0, serum CO2 15, serum glucose 257, hemoglobin A1c 13.7%, C-peptide 0.19 (normal 0.80-3.09), anti-glutamic acid decarboxylase (GAD) antibody > 30 (normal < 1.0), anti-islet cell antibody 80 (normal < 5). TSH was 2.163, free T4 0.82, free T3 1.8 (normal 2.3-4.2). He was treated initially with infusions of insulin and fluids. When his DKA resolved, he was converted to a multiple daily injection (MDI) insulin plan, using Lantus as a basal insulin and Novolog aspart as a bolus insulin at mealtimes, bedtime, and 2 AM if needed. 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   2. Kennith was discharged from [facility] on 07/04/13. He and his mother participated in our [place] education program with Ms. [name] Eaton, RN. 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 When he went into the honeymoon period his Lantus dose was decreased to 6 units at night.  Since then, however, he has exited the honeymoon period and has had a gradual but progressive increase in his exogenous insulin requirement. He started his Dexcom G5 CGM in early 2016. He was converted to an Omnipod pump on 01/16/16. He was subsequently converted to a Dexcom G6.  3. Johnny [name] last [facility] visit was on 11/10/18. At that visit I continued all of his pump settings.   A. In the interim he has been healthy. His allergies are acting up even more.   B. His BGs have been variable, "a few real bad highs and a lot of lows". He often has low BGs 2-3 hours after meals. He is exercising less. He seems to be eating and snacking about the same amount. BGs may also be lower if he does not eat on time. He has more low BGs at breakfast. His recent breakfast BGs varied from 78-153, mostly in the 80s and 90s. He has had occasional BGs in the 40s and 50s about 3 AM.  C. His Omnipod pump has been working well overall. His Dexcom G6 has not been as accurate as before.     Johnny [name] has not had many social interactions since he has been in the stay at home mode. He still remains completely dependent on his mother for all of his basic needs.   E. When he gets absorbed in his X-box he often fails to eat and then develops hypoglycemia.   F. His appetite is good. He likes his french fries, chocolate, and other carbs. Mom has reduced his sugary carb intake.  G. Family is eating all meals at home now. His activity level is very low.  4. [name]:  Constitutional: Johnny [name] feels "pretty good, fine".  He does not miss school, but misses shopping. Eyes: Vision seems to be good when he wears his glasses. He had his last eye appointment in May 2019. The eye doctor did not see any signs of DM damage.   Neck: Johnny [name] has not been touching his neck or complaining of neck pains. There are no other recognized problems of the anterior neck.  Heart: There are no recognized heart problems. The ability to perform  physical activities seems normal for his limited  level of physical activity.  Gastrointestinal: He is still very hungry. He sometimes has postprandial bloating. Bowel movents seem normal. There are no recognized GI problems. Legs: Muscle mass and strength seem normal. He can perform his usual ADLs and other physical activities without obvious discomfort. He has no numbness, tingling, burning, or pains. No edema is noted.  Feet: The skin on his feet is dry. The dermatologist diagnosed psoriasis. He is now applying a prescription lotion. There are no other obvious foot problems. No edema is noted.  Neurologic: There are no recognized problems with muscle movement and strength, sensation, or coordination. Hypoglycemia: Mom says that he has had a few low SGs in the 60s-80s when he sleeps in late.    5. BG log: Parents will bring in his pump and CGM for downloading.   PAST MEDICAL, FAMILY, AND SOCIAL HISTORY    Past Medical History:  Diagnosis Date  . ADHD (attention deficit hyperactivity disorder)   . Allergic rhinitis   . Autism   . Diabetes mellitus without complication ([place])     Family History  Problem Relation Age of Onset  . Hypothyroidism Mother   . Kidney disease Mother        Mom unsure what her kidney issues are  . Autism Sister   . Mental retardation Sister   . Cancer Maternal Grandfather   . Diabetes Paternal Grandmother        Died at age 73.  . Other Other        Paternal great aunt with "hypoglycemia" unsure why     Current Outpatient Medications:  .  ACCU-CHEK FASTCLIX LANCETS MISC, CHECK BLOOD SUGAR 6 TIMES DAILY (Patient not taking: No sig reported), Disp: 204 each, Rfl: 6 .  Continuous Blood Gluc Receiver ([place]) DEVI, 1 kit by Does not apply route as needed., Disp: 1 Device, Rfl: 0 .  Continuous Blood Gluc Sensor (DEXCOM G6 SENSOR) MISC, 1 kit by Does not apply route as needed., Disp: 3 each, Rfl: 5 .  Continuous Blood Gluc Transmit (DEXCOM G6  TRANSMITTER) MISC, 1 kit by Does not apply route as needed., Disp: 1 each, Rfl: 5 .  diphenhydrAMINE (BENADRYL) 25 MG tablet, Take 50 mg by mouth every 6 (six) hours as needed for itching or allergies., Disp: , Rfl:  .  glucagon (GLUCAGEN HYPOKIT) 1 MG SOLR injection, Inject 20m IM for extreme hypoglycemia., Disp: 2 each, Rfl: 5 .  insulin aspart (NOVOLOG FLEXPEN) 100 UNIT/ML FlexPen, Use up to 50 units daily in the event pump fails (Patient not taking: Reported on 11/10/2018), Disp: 15 mL, Rfl: 5 .  Insulin Glargine (LANTUS SOLOSTAR) 100 UNIT/ML Solostar Pen, Use up to 50 units daily in the event pump fails (Patient not taking: Reported on 11/10/2018), Disp: 15 mL, Rfl: 5 .  NOVOLOG 100 UNIT/ML injection, ADMINISTER 200 UNITS VIA INSULIN PUMP EVERY 48 HOURS, Disp: 40 mL, Rfl: 5 .  Urine Glucose-Ketones Test STRP, If urine output and extreme thirst increases, collect urine and check with strip as directed. Disp 1 vial, Disp: 20 each, Rfl: 3  Allergies as of 02/11/2019  . (No Known Allergies)     reports that he has never smoked. He has never used smokeless tobacco. He reports that he does not drink alcohol. Pediatric History  Patient Parents  . Johnny [name] (Mother)   Other Topics Concern  . Not on file  Social History Narrative   Lives with Mom and Sister (17y).  Mom quit smoking 18 years ago.  She has 2 other grown children.  They have a dog at home.  No recent travel.   1. School and Family: He will have another year in the 12th grade in a self-contained special education program. He has health insurance through [facility]. Mom has health insurance as well.  2. Activities: His play and video games.  3. Primary Care Provider: His new PCP is a NP named Leretha Pol, at the [facility].  4. Psychiatrist: None at present  REVIEW OF SYSTEMS: There are no other significant problems involving [name]'s other [name] systems.   Objective:   Physical Exam: Johnny [name] says that he feels  well. His speech is appropriate and easier to understand. He sounds upbeat and happy.   LAB DATA:  No results found for this or any previous visit (from the past 504 hour(s)).   Labs 08/07/18: HbA1c 8.8%, CBG 204  Labs 05/07/18: HbA1c 9.0%, CBG 171  Labs 04/16/18: TSH 2.081  Labs 04/15/18: vpH 7.07; serum CO2 9, creatinine 1.49; Urine glucose >500 and ketones 80  Labs 02/07/18: HbA1c 10.0%  Labs 02/05/18: CBG 251  Labs 12/03/17: CBG 334  Labs 09/30/17: HbA1c 8.7%, CBG 254  Labs 07/24/17: CBG 263; TSH 2.18, free T4 1.1, free T3 3.3; urine microalbumin/creatinine ration 2; CMP normal except glucose 263  Labs 05/07/17: HbA1c 9.3%, CBG 246  Labs 03/04/17: CBG 494. He had not bolused after breakfast due to mom putting in a new pod just before they left home to drive to his appointment today.  Labs 12/26/16: HbA1c 10.2%, CBG 289  Labs 09/25/16: HbA1c 9.5%, CBG 261  Labs 06/27/16: CBG 114; TSH 1.54, free T4 1.1, free T3 3.4; CMP normal; urine microalbumin/creatinine ratio 2; cholesterol 108, triglycerides 153, HDL 43, LDL 34  Labs 04/10/16: HbA1c 9.5%, CBG 145  Labs 02/01/16: CBG 391, TSH 1.72  Labs 12/21/15: HbA1c 8.4%  Labs 10/10/15: HbA1c 8.2%.  Labs 08/09/15: HbA1c 8.5%  Labs 1012/16: HbA1c 8.8%; TSH 2.624, free T4 0.99, free T3 3.4; urine microalbumin/creatinine ratio <0.2; CMP normal  Labs 01/27/15: HbA1c 8.8%  Labs 10/25/14: Hemoglobin A1c 8.2%, compared with 7.9% at the last visit and with 8.4% at the prior visit.    Labs 10/22/13: CMP normal; C-peptide 0.65, increased from 0.19 at diagnosis (normal 0.80-3.90); TSH 1.265, free T4 1.01, free T3 3.4, TPO antibody 10.4    Assessment and Plan:   ASSESSMENT:  1-3. T1DM/hypoglycemia/hypoglycemia unawareness  A. Since exiting the honeymoon period, Johnny [name] has required more and more exogenous insulin than he needed before. His BGs have been much more variable over time.   B. At is last visit his BGs had decreased more, in part due to  not eating out during the covid-19 closures. More recently, he has had many BGs in the 45s and 90s at breakfast, but has also had a few BGs in the 40s-60 about 3 AM. He has also sometimes had  lower BGs 2-3 hours after meals. Thus far Ms. Drema Dallas has not been able to discover causative factors for the low BGs.   C. His current basal rate settings and bolus settings have helped to control his BGs. He still sometimes may have inaccurate carb counts at certain meals.   D. We really need to see his pump and CGM printouts to understand what is going on with Johnny [name] BGs.   E. Ms. Drema Dallas is doing a very good job overall of taking care of Johnny [name].     4-5. Goiter/thyroiditis:   A. He has the personal history of autoimmune T1DM. He also has the family history of autoimmune thyroid disease.  B. He continues to have waxing and waning of thyroid gland size. At his second prior visit the thyroid gland was smaller overall, but it was larger at his prior visit and at his last visit. The goiter was smaller today.   C. The pattern of waxing and waning of thyroid gland size and thyroid lobe sizes is c/w evolving Hashimoto's thyroiditis. It is almost a certainty that he will become hypothyroid in the future.   D.  He was euthyroid in April 2015, in October 2016, in January 2019, and again in September 2019. His TSH was mid-range euthyroid in both July and December 2017, but somewhat higher in January and September 2019.    E. He seems to be clinically euthyroid now. He needs to have repeat TFTs performed.  6. Adjustment reaction: Things are going pretty well overall. Mom is trying very hard to take good care of Johnny [name].  7. Autism/ADHD/ mental retardation: Johnny [name] autism has improved somewhat in that he is more social and outgoing. However, he remains completely dependent upon mom for his meals, shelter, clothing, recreational activities, physical activities, and T1DM care. 8. Dehydration: He has not been drinking a lot,  except at meals. When Johnny [name]'s BGs are high he sometimes does not drink enough liquid to meet his needs, and so becomes dehydrated.  9. Peripheral neuropathy: His neuropathy was not evident at his last visit. He says he is not having any symptoms today.   PLAN:  1. Diagnostic: Continue BG checks as planned. Bring in the meter and CGM soon. Have lab tests done when it seems safe to do so.  2. Therapeutic: Continue Small bedtime snack.   Continue basal rates: MN: 1.45 4 AM: 1.60 8 AM: 1.15 Noon: 0.95 6 PM: 1.25  Continue ISF: 50  Continue BG targets: MN: 180 6 AM: 150 9 PM: 180  Continue ICRs: MN: 15: 6 AM:12 Noon: 15  3. Patient education: We discussed his elevated BGs, his hypoglycemia, and all of the above at length.  Mom is more concerned today about when his BGs often being too low. go too high and when they go too low.  I again asked mom to call me if Johnny [name] has any BGs during the night or in the mornings that are <80 and unexplained. I also asked her to keep a log of unusual high BGs or lower BGs and the causes.  4. Follow-up: two months  Level of Service: This visit lasted in excess of 45 minutes. More than 50% of the visit was devoted to counseling.   Tillman Sers, MD, CDE Adult and Pediatric Endocrinology   This is a Pediatric Specialist E-Visit follow up consult provided via WebEx Johnny [name] and his mother, Ms. Johnny [name], consented to an E-Visit consult today.  Location of patient: Johnny [name] is at home.  Location of provider: Renee Rival is at the [place] clinic office.  Patient was referred by Ronnell Freshwater, NP   The following participants were involved in this E-Visit: Mitzi Hansen, Ms. Drema Dallas, Dr. Tobe Sos  Chief Complain/ Reason for E-Visit today: T1DM, hypoglycemia, goiter, thyroiditis Total time on call: 40 minutes Follow up: 2 months

## 2019-03-03 ENCOUNTER — Telehealth (INDEPENDENT_AMBULATORY_CARE_PROVIDER_SITE_OTHER): Payer: Self-pay | Admitting: "Endocrinology

## 2019-03-03 DIAGNOSIS — IMO0001 Reserved for inherently not codable concepts without codable children: Secondary | ICD-10-CM

## 2019-03-03 MED ORDER — NOVOLOG FLEXPEN 100 UNIT/ML ~~LOC~~ SOPN: PEN_INJECTOR | SUBCUTANEOUS | 5 refills | Status: DC

## 2019-03-03 MED ORDER — INSULIN ASPART 100 UNIT/ML ~~LOC~~ SOLN: SUBCUTANEOUS | 5 refills | Status: DC

## 2019-03-03 NOTE — Telephone Encounter (Signed)
°  Who's calling (name and relationship to patient) : [place] contact number: [phone]  Provider they see: Tobe Sos  Reason for call: Family is requesting that patient need an Rx sent to [facility] for patient Novalog valves and pens.  They are requesting due to the delivery can be sent to the home.  Please send a new Rx     PRESCRIPTION REFILL ONLY  Name of prescription:  Pharmacy:

## 2019-03-03 NOTE — Telephone Encounter (Signed)
Prescription sent to pharmacy as requested by family

## 2019-03-31 ENCOUNTER — Telehealth (INDEPENDENT_AMBULATORY_CARE_PROVIDER_SITE_OTHER): Payer: Self-pay | Admitting: "Endocrinology

## 2019-03-31 NOTE — Telephone Encounter (Signed)
°  Who's calling (name and relationship to patient) : Margaretha Sheffield (Mother)  Best contact number: [phone] Provider they see: Dr. Tobe Sos Reason for call: Mom stated she needs to bring pt in for labs but is not sure if he needs to be fasting. Mom stated that his sugar is dropping currently and she needs to know how to proceed.

## 2019-03-31 NOTE — Telephone Encounter (Signed)
Per mother Margaretha Sheffield, she already came to the office to get labs drawn and had pump and meters downloaded. Michela Pitcher he is doing good.

## 2019-04-01 LAB — COMPREHENSIVE METABOLIC PANEL
AG Ratio: 1.7 (calc) (ref 1.0–2.5)
ALT: 30 U/L (ref 9–46)
AST: 23 U/L (ref 10–40)
Albumin: 4.8 g/dL (ref 3.6–5.1)
Alkaline phosphatase (APISO): 90 U/L (ref 36–130)
BUN: 14 mg/dL (ref 7–25)
CO2: 28 mmol/L (ref 20–32)
Calcium: 9.8 mg/dL (ref 8.6–10.3)
Chloride: 100 mmol/L (ref 98–110)
Creat: 0.94 mg/dL (ref 0.60–1.35)
Globulin: 2.8 g/dL (calc) (ref 1.9–3.7)
Glucose, Bld: 77 mg/dL (ref 65–99)
Potassium: 3.8 mmol/L (ref 3.5–5.3)
Sodium: 140 mmol/L (ref 135–146)
Total Bilirubin: 1 mg/dL (ref 0.2–1.2)
Total Protein: 7.6 g/dL (ref 6.1–8.1)

## 2019-04-01 LAB — T4, FREE: Free T4: 1.1 ng/dL (ref 0.8–1.8)

## 2019-04-01 LAB — LIPID PANEL
Cholesterol: 111 mg/dL (ref <200)
HDL: 44 mg/dL (ref >=40)
LDL Cholesterol (Calc): 49 mg/dL (calc)
Non-HDL Cholesterol (Calc): 67 mg/dL (calc) (ref <130)
Total CHOL/HDL Ratio: 2.5 (calc) (ref <5.0)
Triglycerides: 94 mg/dL (ref <150)

## 2019-04-01 LAB — MICROALBUMIN / CREATININE URINE RATIO
Creatinine, Urine: 205 mg/dL (ref 20–320)
Microalb Creat Ratio: 3 mcg/mg creat (ref <30)
Microalb, Ur: 0.6 mg/dL

## 2019-04-01 LAB — TSH: TSH: 1.2 mIU/L (ref 0.40–4.50)

## 2019-04-01 LAB — T3, FREE: T3, Free: 3.6 pg/mL (ref 2.3–4.2)

## 2019-04-02 ENCOUNTER — Encounter: Payer: Self-pay | Admitting: Nurse Practitioner

## 2019-04-06 ENCOUNTER — Encounter (INDEPENDENT_AMBULATORY_CARE_PROVIDER_SITE_OTHER): Payer: Self-pay | Admitting: *Deleted

## 2019-04-14 ENCOUNTER — Other Ambulatory Visit: Payer: Self-pay

## 2019-04-14 ENCOUNTER — Encounter (INDEPENDENT_AMBULATORY_CARE_PROVIDER_SITE_OTHER): Payer: Self-pay | Admitting: "Endocrinology

## 2019-04-14 ENCOUNTER — Ambulatory Visit (INDEPENDENT_AMBULATORY_CARE_PROVIDER_SITE_OTHER): Payer: Medicaid Other | Admitting: "Endocrinology

## 2019-04-14 ENCOUNTER — Telehealth (INDEPENDENT_AMBULATORY_CARE_PROVIDER_SITE_OTHER): Payer: Self-pay | Admitting: "Endocrinology

## 2019-04-14 ENCOUNTER — Encounter (INDEPENDENT_AMBULATORY_CARE_PROVIDER_SITE_OTHER): Payer: Self-pay | Admitting: *Deleted

## 2019-04-14 VITALS — BP 122/76 | HR 72 | Ht 66.42 in | Wt 161.4 lb

## 2019-04-14 DIAGNOSIS — E1065 Type 1 diabetes mellitus with hyperglycemia: Secondary | ICD-10-CM

## 2019-04-14 DIAGNOSIS — E10649 Type 1 diabetes mellitus with hypoglycemia without coma: Secondary | ICD-10-CM | POA: Diagnosis not present

## 2019-04-14 DIAGNOSIS — E049 Nontoxic goiter, unspecified: Secondary | ICD-10-CM

## 2019-04-14 DIAGNOSIS — Z23 Encounter for immunization: Secondary | ICD-10-CM | POA: Diagnosis not present

## 2019-04-14 DIAGNOSIS — E1042 Type 1 diabetes mellitus with diabetic polyneuropathy: Secondary | ICD-10-CM

## 2019-04-14 DIAGNOSIS — E063 Autoimmune thyroiditis: Secondary | ICD-10-CM

## 2019-04-14 DIAGNOSIS — IMO0001 Reserved for inherently not codable concepts without codable children: Secondary | ICD-10-CM

## 2019-04-14 DIAGNOSIS — F840 Autistic disorder: Secondary | ICD-10-CM

## 2019-04-14 LAB — POCT GLYCOSYLATED HEMOGLOBIN (HGB A1C): Hemoglobin A1C: 8.2 % — AB (ref 4.0–5.6)

## 2019-04-14 LAB — POCT GLUCOSE (DEVICE FOR HOME USE): POC Glucose: 307 mg/dl — AB (ref 70–99)

## 2019-04-14 MED ORDER — ACCU-CHEK FASTCLIX LANCETS MISC: 6 refills | Status: DC

## 2019-04-14 NOTE — Patient Instructions (Signed)
Follow up visit in 3 months. Call [name] Eaton monthly and send in Dexcom reports.

## 2019-04-14 NOTE — Telephone Encounter (Signed)
Mom checking out from appointment today, states Dr. Tobe Sos advised her to notify the nurse or MA to get a paper drawn up for Johnny [name] for his handicap sticker and then he would sign it. Mom asking that letter be sent to their home in the mail once completed. Address is listed correctly at [address] [place] [id number]

## 2019-04-14 NOTE — Progress Notes (Signed)
Subjective:  Patient Name: Johnny [name] Date of Birth: 1995-10-18  MRN: [id number]  Johnny [name]  presents at today's clinic visit for follow-up evaluation and management  of his T1DM, hypoglycemia, autism, ADHD, possible mental retardation, goiter, thyroiditis, adjustment reaction, and family history of autoimmune thyroid disease.  HISTORY OF PRESENT ILLNESS:   Johnny [name] is a 23 y.o. Caucasian young man.   Gray was accompanied by his mother.  46. Johnny [name] was seen at [facility] in [place], [place], on 06/26/13 for complaints of weight loss, fatigue, personality change, polyuria, polydipsia, and vomiting. New-onset DM and DKA were diagnosed. He received fluids at the ED at [facility], was transferred to [facility], and was admitted to the PICU for evaluation and management of new-onset T1DM, DKA, dehydration, and ketonuria in the setting of pre-existing autism, ADHD, and possible mental retardation. Significantly abnormal lab results included: Venous pH 7.257, serum potassium 3.0, serum CO2 15, serum glucose 257, hemoglobin A1c 13.7%, C-peptide 0.19 (normal 0.80-3.09), anti-glutamic acid decarboxylase (GAD) antibody > 30 (normal < 1.0), anti-islet cell antibody 80 (normal < 5). TSH was 2.163, free T4 0.82, free T3 1.8 (normal 2.3-4.2). He was treated initially with infusions of insulin and fluids. When his DKA resolved, he was converted to a multiple daily injection (MDI) insulin plan, using Lantus as a basal insulin and Novolog aspart as a bolus insulin at mealtimes, bedtime, and 2 AM if needed. 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   2. Johnny [name] was discharged from [facility] on 07/04/13. He and his mother participated in our [place] education program with Ms. [name] Eaton, RN. 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 When he went into the honeymoon period his Lantus dose was decreased to 6 units at night.  Since then, however, he has exited the honeymoon period and has had a gradual but progressive increase in his exogenous insulin requirement. He started his Dexcom G5 CGM in early 2016. He was converted to an Omnipod pump on 01/16/16. He was subsequently converted to a Dexcom G6.  3. Johnny [name]'s last [facility] visit was on 02/11/19. At that visit I continued all of his pump settings.   A. In the interim he has been healthy. His allergies are acting up again.    B. His BGs have been pretty good overall. Mom often adjusts his boluses to compensate for physical activity. He seems to be eating and snacking a bit less. He has had occasional BGs in the 50s and 60s in the mornings or after meals.   C. His Omnipod pump has been working well overall. His Dexcom G6 has also been working well.     Johnny [name] has not had many social interactions since he has been in the stay at home mode. He still remains completely dependent on his mother for all of his basic needs.   E. When he gets absorbed in his X-box he often fails to eat, does not pay attention to his alerts, and then develops hypoglycemia.   F. His appetite is good. He likes his french fries, chocolate, and other carbs. Mom has reduced his sugary carb intake.  G. Family is eating all meals at home now. His activity level is very low.    4. [name]:  Constitutional: Johnny [name] feels "pretty good, two thumbs up".  He does not miss school, but misses shopping. Eyes: Vision seems to be good when he wears his  glasses. He had his last eye appointment in May 2019. The eye doctor did not see any signs of DM damage.   Neck: Johnny [name] has not been touching his neck or complaining of neck pains. There are no other recognized problems of the anterior neck.  Heart: There are no recognized heart problems. The ability to perform physical activities seems normal for his limited  level of physical activity.  Gastrointestinal: He is not as hungry. He sometimes has  postprandial bloating. Bowel movents seem normal. There are no recognized GI problems. Legs: Muscle mass and strength seem normal. He can perform his usual ADLs and other physical activities without obvious discomfort. He has no numbness, tingling, burning, or pains. No edema is noted.  Feet: The skin on his feet is dry. The dermatologist diagnosed psoriasis. He is no longer routinely  applying a prescription lotion. There are no other obvious foot problems. No edema is noted.  Neurologic: There are no recognized problems with muscle movement and strength, sensation, or coordination. Hypoglycemia: Mom says that he has had a few low SGs in the 50s-60s.    5. BG printout: He takes an average of 8.1 BG readings per day. Average BG is 180, range 64-325.    6. CGM printout: Average SG is 206. Higher BGs occur after high carb meals, especially breakfasts. Lower BGs occur at breakfast, sometimes after lunch, and often after dinner.   PAST MEDICAL, FAMILY, AND SOCIAL HISTORY    Past Medical History:  Diagnosis Date  . ADHD (attention deficit hyperactivity disorder)   . Allergic rhinitis   . Autism   . Diabetes mellitus without complication ([place])     Family History  Problem Relation Age of Onset  . Hypothyroidism Mother   . Kidney disease Mother        Mom unsure what her kidney issues are  . Autism Sister   . Mental retardation Sister   . Cancer Maternal Grandfather   . Diabetes Paternal Grandmother        Died at age 18.  . Other Other        Paternal great aunt with "hypoglycemia" unsure why     Current Outpatient Medications:  .  Continuous Blood Gluc Receiver ([place]) DEVI, 1 kit by Does not apply route as needed., Disp: 1 Device, Rfl: 0 .  Continuous Blood Gluc Sensor (DEXCOM G6 SENSOR) MISC, 1 kit by Does not apply route as needed., Disp: 3 each, Rfl: 5 .  Continuous Blood Gluc Transmit (DEXCOM G6 TRANSMITTER) MISC, 1 kit by Does not apply route as needed., Disp: 1  each, Rfl: 5 .  diphenhydrAMINE (BENADRYL) 25 MG tablet, Take 50 mg by mouth every 6 (six) hours as needed for itching or allergies., Disp: , Rfl:  .  glucagon (GLUCAGEN HYPOKIT) 1 MG SOLR injection, Inject 45m IM for extreme hypoglycemia., Disp: 2 each, Rfl: 5 .  insulin aspart (NOVOLOG FLEXPEN) 100 UNIT/ML FlexPen, Use up to 50 units daily in the event pump fails, Disp: 15 mL, Rfl: 5 .  insulin aspart (NOVOLOG) 100 UNIT/ML injection, ADMINISTER 200 UNITS VIA INSULIN PUMP EVERY 48 HOURS, Disp: 40 mL, Rfl: 5 .  Insulin Glargine (LANTUS SOLOSTAR) 100 UNIT/ML Solostar Pen, Use up to 50 units daily in the event pump fails, Disp: 15 mL, Rfl: 5 .  Urine Glucose-Ketones Test STRP, If urine output and extreme thirst increases, collect urine and check with strip as directed. Disp 1 vial, Disp: 20 each, Rfl: 3 .  Accu-Chek FastClix Lancets MISC, CHECK BLOOD SUGAR 6 TIMES DAILY, Disp: 204 each, Rfl: 6  Allergies as of 04/14/2019  . (No Known Allergies)     reports that he has never smoked. He has never used smokeless tobacco. He reports that he does not drink alcohol. Pediatric History  Patient Parents  . [name] (Mother)   Other Topics Concern  . Not on file  Social History Narrative   Lives with Mom and Sister (17y).  Mom quit smoking 18 years ago.  She has 2 other grown children.  They have a dog at home.  No recent travel.   1. School and Family: He will have another year in the 12th grade in a self-contained special education program. He has health insurance through [facility]. Mom has health insurance as well. Family will move to [place] soon. Mom wants me to continue to see Johnny [name], but we also discussed transferring his care to an adult endocrinologist in the [place] area. .  2. Activities: His play and video games.  3. Primary Care Provider: His new PCP is a NP named Leretha Pol, at the [facility].  4. Psychiatrist: None at present  REVIEW OF SYSTEMS: There are no  other significant problems involving Johnny [name]'s other [name] systems.   Objective:   BP 122/76   Pulse 72   Ht 5' 6.42" (1.687 m)   Wt 161 lb 6.4 oz (73.2 kg)   BMI 25.72 kg/m   Growth percentile SmartLinks can only be used for patients less than 84 years old.  Wt Readings from Last 3 Encounters:  04/14/19 161 lb 6.4 oz (73.2 kg)  09/25/18 162 lb (73.5 kg)  08/07/18 157 lb (71.2 kg)    Ht Readings from Last 3 Encounters:  04/14/19 5' 6.42" (1.687 m)  09/25/18 5' 6.75" (1.695 m)  08/07/18 5' 6.61" (1.692 m)    HC Readings from Last 3 Encounters:  No data found for [facility]   Facility age limit for growth percentiles is 20 years.  [name] mass index is 25.72 kg/m. Facility age limit for growth percentiles is 20 years.  [name] surface area is 1.85 meters squared.  Physical Exam:  Constitutional: The patient looks healthy and appears physically and emotionally well. He has gained 4 pounds in 8 months. He is alert and bright for him. His affect is good, he is very upbeat and friendly, but his insight is poor.  Eyes: There is no arcus or proptosis.  Mouth: The oropharynx appears normal. The tongue appears normal. There is normal oral moisture. There is no obvious gingivitis. Neck: There are no bruits present. The thyroid gland appears enlarged. The thyroid gland is enlarged at approximately 22 grams in size. The consistency of the thyroid gland is full. There is no thyroid tenderness to palpation. Lungs: The lungs are clear. Air movement is good. Heart: The heart rhythm and rate appear normal. Heart sounds S1 and S2 are normal. I do not appreciate any pathologic heart murmurs. Abdomen: The abdominal size is larger. Bowel sounds are normal. The abdomen is soft and non-tender. There is no obviously palpable hepatomegaly, splenomegaly, or other masses.  Arms: Muscle mass appears appropriate for age.  Hands: There is no obvious tremor. Phalangeal and metacarpophalangeal joints appear normal.  Palms are normal. Legs: Muscle mass appears appropriate for age. There is no edema.  Feet: There are no significant deformities. Dorsalis pedis pulses are 1+ on the right and 2+ on the left. His psoriasis is worse.  Neurologic: Muscle strength is normal for age and gender  in both the upper and the lower extremities. Muscle tone appears normal. Sensation to touch is normal in the legs and feet.   LAB DATA:  Results for orders placed or performed in visit on 04/14/19 (from the past 504 hour(s))  POCT Glucose (Device for Home Use)   Collection Time: 04/14/19  1:25 PM  Result Value Ref Range   Glucose Fasting, POC     POC Glucose 307 (A) 70 - 99 mg/dl  POCT glycosylated hemoglobin (Hb A1C)   Collection Time: 04/14/19  1:25 PM  Result Value Ref Range   Hemoglobin A1C 8.2 (A) 4.0 - 5.6 %   HbA1c POC (<> result, manual entry)     HbA1c, POC (prediabetic range)     HbA1c, POC (controlled diabetic range)    Results for orders placed or performed in visit on 08/07/18 (from the past 504 hour(s))  T3, free   Collection Time: 03/31/19 12:00 AM  Result Value Ref Range   T3, Free 3.6 2.3 - 4.2 pg/mL  T4, free   Collection Time: 03/31/19 12:00 AM  Result Value Ref Range   Free T4 1.1 0.8 - 1.8 ng/dL  TSH   Collection Time: 03/31/19 12:00 AM  Result Value Ref Range   TSH 1.20 0.40 - 4.50 mIU/L  Comprehensive metabolic panel   Collection Time: 03/31/19 12:00 AM  Result Value Ref Range   Glucose, Bld 77 65 - 99 mg/dL   BUN 14 7 - 25 mg/dL   Creat 0.94 0.60 - 1.35 mg/dL   BUN/Creatinine Ratio NOT APPLICABLE 6 - 22 (calc)   Sodium 140 135 - 146 mmol/L   Potassium 3.8 3.5 - 5.3 mmol/L   Chloride 100 98 - 110 mmol/L   CO2 28 20 - 32 mmol/L   Calcium 9.8 8.6 - 10.3 mg/dL   Total Protein 7.6 6.1 - 8.1 g/dL   Albumin 4.8 3.6 - 5.1 g/dL   Globulin 2.8 1.9 - 3.7 g/dL (calc)   AG Ratio 1.7 1.0 - 2.5 (calc)   Total Bilirubin 1.0 0.2 - 1.2 mg/dL   Alkaline phosphatase (APISO) 90 36 - 130 U/L    AST 23 10 - 40 U/L   ALT 30 9 - 46 U/L  Lipid panel   Collection Time: 03/31/19 12:00 AM  Result Value Ref Range   Cholesterol 111 <200 mg/dL   HDL 44 > OR = 40 mg/dL   Triglycerides 94 <150 mg/dL   LDL Cholesterol (Calc) 49 mg/dL (calc)   Total CHOL/HDL Ratio 2.5 <5.0 (calc)   Non-HDL Cholesterol (Calc) 67 <130 mg/dL (calc)  Microalbumin / creatinine urine ratio   Collection Time: 03/31/19 12:00 AM  Result Value Ref Range   Creatinine, Urine 205 20 - 320 mg/dL   Microalb, Ur 0.6 mg/dL   Microalb Creat Ratio 3 <30 mcg/mg creat    Labs 04/14/19: HbA1c 8.2%, CBG 307  Labs 03/31/19: TSH 1.20, free T4 1.1, free T3 3.6; CMP normal; cholesterol 111, triglycerides 94, HDL 44, LDL 94;  microalbumin/creatinine ratio 3  Labs 08/07/18: HbA1c 8.8%, CBG 204  Labs 05/07/18: HbA1c 9.0%, CBG 171  Labs 04/16/18: TSH 2.081  Labs 04/15/18: vpH 7.07; serum CO2 9, creatinine 1.49; Urine glucose >500 and ketones 80  Labs 02/07/18: HbA1c 10.0%  Labs 02/05/18: CBG 251  Labs 12/03/17: CBG 334  Labs 09/30/17: HbA1c 8.7%, CBG 254  Labs 07/24/17: CBG 263; TSH 2.18, free T4 1.1, free T3 3.3;  urine microalbumin/creatinine ration 2; CMP normal except glucose 263  Labs 05/07/17: HbA1c 9.3%, CBG 246  Labs 03/04/17: CBG 494. He had not bolused after breakfast due to mom putting in a new pod just before they left home to drive to his appointment today.  Labs 12/26/16: HbA1c 10.2%, CBG 289  Labs 09/25/16: HbA1c 9.5%, CBG 261  Labs 06/27/16: CBG 114; TSH 1.54, free T4 1.1, free T3 3.4; CMP normal; urine microalbumin/creatinine ratio 2; cholesterol 108, triglycerides 153, HDL 43, LDL 34  Labs 04/10/16: HbA1c 9.5%, CBG 145  Labs 02/01/16: CBG 391, TSH 1.72  Labs 12/21/15: HbA1c 8.4%  Labs 10/10/15: HbA1c 8.2%.  Labs 08/09/15: HbA1c 8.5%  Labs 1012/16: HbA1c 8.8%; TSH 2.624, free T4 0.99, free T3 3.4; urine microalbumin/creatinine ratio <0.2; CMP normal  Labs 01/27/15: HbA1c 8.8%  Labs 10/25/14: Hemoglobin A1c  8.2%, compared with 7.9% at the last visit and with 8.4% at the prior visit.    Labs 10/22/13: CMP normal; C-peptide 0.65, increased from 0.19 at diagnosis (normal 0.80-3.90); TSH 1.265, free T4 1.01, free T3 3.4, TPO antibody 10.4    Assessment and Plan:   ASSESSMENT:  1-3. T1DM/hypoglycemia/hypoglycemia unawareness  A. Since exiting the honeymoon period, Johnny [name] has required more and more exogenous insulin than he needed before. His BGs have been much more variable over time.   B. His current basal rate settings and bolus settings have helped to control his BGs. He still sometimes may have inaccurate carb counts at certain meals.   C. He sometimes has low BGs if he sleeps in late or does not eat on time.   D. Ms. Drema Dallas is doing a very good job overall of taking care of Johnny [name].     4-5. Goiter/thyroiditis:   A. He has the personal history of autoimmune T1DM. He also has the family history of autoimmune thyroid disease.  B. He continues to have waxing and waning of thyroid gland size. The pattern of waxing and waning of thyroid gland size and thyroid lobe sizes is c/w evolving Hashimoto's thyroiditis. It is very likely that he will become hypothyroid in the future.   C.  He was euthyroid in April 2015, in October 2016, in January 2019, in September 2019, and again in September 2020. He is clinically euthyroid now.   6. Adjustment reaction: Things are going pretty well overall. Mom is trying very hard to take good care of Johnny [name].  7. Autism/ADHD/ mental retardation: Johnny [name]'s autism has improved somewhat in that he is more social and outgoing. However, he remains completely dependent upon mom for his meals, shelter, clothing, recreational activities, physical activities, and T1DM care. 8. Peripheral neuropathy: His neuropathy was not evident at his last visit or today. He says he is not having any symptoms today.   PLAN:  1. Diagnostic: Continue BG checks as planned. Call [place] monthly to print  out the Dexcom.  2. Therapeutic: Continue Small bedtime snack. Given the family's upcoming move to [place], I did not change any of his insulin pump settings.  Continue basal rates: MN: 1.45 4 AM: 1.60 8 AM: 1.15 Noon: 0.95 6 PM: 1.25  Continue ISF: 50  Continue BG targets: MN: 180 6 AM: 150 9 PM: 180  Continue ICRs: MN: 15: 6 AM:12 Noon: 15  3. Patient education: We discussed his elevated BGs, his hypoglycemia, and all of the above at length.  Mom is again more concerned about when his BGs go too low.  I again asked mom to call  me if Georgi has any BGs during the night or in the mornings that are <80 and unexplained. I also asked her to keep a log of unusual high BGs or lower BGs and the causes. We discussed possibly seeking out an adult endocrinologist for [facility] in [place].  4. Follow-up: three months  Level of Service: This visit lasted in excess of 55 minutes. More than 50% of the visit was devoted to counseling.   Tillman Sers, MD, CDE Adult and Pediatric Endocrinology

## 2019-04-14 NOTE — Telephone Encounter (Signed)
Mychart message sent.

## 2019-06-02 ENCOUNTER — Telehealth: Payer: Self-pay

## 2019-06-02 NOTE — Telephone Encounter (Signed)
LEFT MESSAGE FOR PATIENT TO CALL US TO GO OVER SCREENING QUESTIONS AND TO CONFIRM 06-04-19 APPOINTMENT. °

## 2019-06-04 ENCOUNTER — Encounter: Payer: Self-pay | Admitting: Nurse Practitioner

## 2019-07-10 ENCOUNTER — Telehealth: Payer: Self-pay

## 2019-07-10 NOTE — Telephone Encounter (Signed)
CONFIRMED AND SCREENED FOR 07-14-19 OV. 

## 2019-07-14 ENCOUNTER — Ambulatory Visit: Payer: Medicaid Other | Admitting: Nurse Practitioner

## 2019-07-14 ENCOUNTER — Other Ambulatory Visit: Payer: Self-pay

## 2019-07-14 ENCOUNTER — Encounter: Payer: Self-pay | Admitting: Nurse Practitioner

## 2019-07-14 VITALS — BP 114/65 | HR 68 | Temp 97.4°F | Resp 16 | Ht 66.0 in | Wt 157.0 lb

## 2019-07-14 DIAGNOSIS — E1065 Type 1 diabetes mellitus with hyperglycemia: Secondary | ICD-10-CM | POA: Diagnosis not present

## 2019-07-14 DIAGNOSIS — F840 Autistic disorder: Secondary | ICD-10-CM | POA: Diagnosis not present

## 2019-07-14 DIAGNOSIS — L700 Acne vulgaris: Secondary | ICD-10-CM

## 2019-07-14 MED ORDER — ACCU-CHEK FASTCLIX LANCETS MISC: 6 refills | Status: DC

## 2019-07-14 NOTE — Progress Notes (Signed)
[facility] [place], [place] [address]  Internal MEDICINE  Office Visit Note  Patient Name: Johnny [name]  [id number]  [id number]  Date of Service: 07/22/2019  Chief Complaint  Patient presents with  . Medical Management of Chronic Issues    6 month follow up     The patient is here for routine follow up visit. The patient has moderate autistic disorder and type 2 diabetes. He is working with psychiatrist and endocrinologist for these conditions. Overall, he manages well with current medication.  He does need to have a new prescription for lancets to conitnue to check blood sugars as often as needed. He checks blood sugars up to five times daily. Uses insulin pump. He has no new  Concerns or complaints.       Current Medication: Outpatient Encounter Medications as of 07/14/2019  Medication Sig  . Accu-Chek FastClix Lancets MISC CHECK BLOOD SUGAR 6 TIMES DAILY - E11.9  . Continuous Blood Gluc Receiver (DEXCOM G6 RECEIVER) DEVI 1 kit by Does not apply route as needed.  . Continuous Blood Gluc Transmit (DEXCOM G6 TRANSMITTER) MISC 1 kit by Does not apply route as needed.  . diphenhydrAMINE (BENADRYL) 25 MG tablet Take 50 mg by mouth every 6 (six) hours as needed for itching or allergies.  Marland Kitchen glucagon (GLUCAGEN HYPOKIT) 1 MG SOLR injection Inject 36m IM for extreme hypoglycemia.  .Marland Kitcheninsulin aspart (NOVOLOG FLEXPEN) 100 UNIT/ML FlexPen Use up to 50 units daily in the event pump fails  . insulin aspart (NOVOLOG) 100 UNIT/ML injection ADMINISTER 200 UNITS VIA INSULIN PUMP EVERY 48 HOURS  . Insulin Glargine (LANTUS SOLOSTAR) 100 UNIT/ML Solostar Pen Use up to 50 units daily in the event pump fails  . Urine Glucose-Ketones Test STRP If urine output and extreme thirst increases, collect urine and check with strip as directed. Disp 1 vial  . [DISCONTINUED] Accu-Chek FastClix Lancets MISC CHECK BLOOD SUGAR 6 TIMES DAILY  . [DISCONTINUED] Accu-Chek FastClix Lancets MISC  CHECK BLOOD SUGAR 6 TIMES DAILY - E11.9  . [DISCONTINUED] Continuous Blood Gluc Sensor (DEXCOM G6 SENSOR) MISC 1 kit by Does not apply route as needed.   No facility-administered encounter medications on file as of 07/14/2019.    Surgical History: Past Surgical History:  Procedure Laterality Date  . DENTAL REHABILITATION      Medical History: Past Medical History:  Diagnosis Date  . ADHD (attention deficit hyperactivity disorder)   . Allergic rhinitis   . Autism   . Diabetes mellitus without complication ([place]     Family History: Family History  Problem Relation Age of Onset  . Hypothyroidism Mother   . Kidney disease Mother        Mom unsure what her kidney issues are  . Autism Sister   . Mental retardation Sister   . Cancer Maternal Grandfather   . Diabetes Paternal Grandmother        Died at age 21  . Other Other        Paternal great aunt with "hypoglycemia" unsure why    Social History   Socioeconomic History  . Marital status: Single    Spouse name: Not on file  . Number of children: Not on file  . Years of education: Not on file  . Highest education level: Not on file  Occupational History  . Not on file  Tobacco Use  . Smoking status: Never Smoker  . Smokeless tobacco: Never Used  Substance and Sexual Activity  . Alcohol  use: No  . Drug use: Not on file  . Sexual activity: Not on file  Other Topics Concern  . Not on file  Social History Narrative   Lives with Mom and Sister (17y).  Mom quit smoking 18 years ago.  She has 2 other grown children.  They have a dog at home.  No recent travel.   Social Determinants of Health   Financial Resource Strain:   . Difficulty of Paying Living Expenses: Not on file  Food Insecurity:   . Worried About Charity fundraiser in the Last Year: Not on file  . Ran Out of Food in the Last Year: Not on file  Transportation Needs:   . Lack of Transportation (Medical): Not on file  . Lack of Transportation  (Non-Medical): Not on file  Physical Activity:   . Days of Exercise per Week: Not on file  . Minutes of Exercise per Session: Not on file  Stress:   . Feeling of Stress : Not on file  Social Connections:   . Frequency of Communication with Friends and Family: Not on file  . Frequency of Social Gatherings with Friends and Family: Not on file  . Attends Religious Services: Not on file  . Active Member of Clubs or Organizations: Not on file  . Attends Archivist Meetings: Not on file  . Marital Status: Not on file  Intimate Partner Violence:   . Fear of Current or Ex-Partner: Not on file  . Emotionally Abused: Not on file  . Physically Abused: Not on file  . Sexually Abused: Not on file      Review of Systems  Constitutional: Negative for chills, fatigue and unexpected weight change.  HENT: Negative for congestion, postnasal drip, rhinorrhea, sneezing and sore throat.   Eyes: Negative for redness.  Respiratory: Negative for cough, chest tightness and shortness of breath.   Cardiovascular: Negative for chest pain and palpitations.  Gastrointestinal: Negative for abdominal pain, constipation, diarrhea, nausea and vomiting.  Endocrine: Negative for cold intolerance, polydipsia and polyuria.       Patient on insulin pump due to type 1 diabetes. Followed by endocrinology.  Musculoskeletal: Negative for arthralgias, back pain, joint swelling and neck pain.  Skin: Negative for rash.       Cystic acne, mostly on his face. Improving with new treatment.   Allergic/Immunologic: Positive for environmental allergies and food allergies.  Neurological: Negative for dizziness, tremors, numbness and headaches.  Hematological: Negative for adenopathy. Does not bruise/bleed easily.  Psychiatric/Behavioral: Negative for behavioral problems (Depression), sleep disturbance and suicidal ideas. The patient is nervous/anxious.    Today's Vitals   07/14/19 1556  BP: 114/65  Pulse: 68  Resp:  16  Temp: (!) 97.4 F (36.3 C)  SpO2: 97%  Weight: 157 lb (71.2 kg)  Height: _0  (1.676 m)   Body mass index is 25.34 kg/m.  Physical Exam Vitals and nursing note reviewed.  Constitutional:      General: He is not in acute distress.    Appearance: Normal appearance. He is well-developed. He is not diaphoretic.  HENT:     Head: Normocephalic and atraumatic.     Mouth/Throat:     Pharynx: No oropharyngeal exudate.  Eyes:     Pupils: Pupils are equal, round, and reactive to light.  Neck:     Thyroid: No thyromegaly.     Vascular: No JVD.     Trachea: No tracheal deviation.  Cardiovascular:     Rate  and Rhythm: Normal rate and regular rhythm.     Heart sounds: Normal heart sounds. No murmur. No friction rub. No gallop.   Pulmonary:     Effort: Pulmonary effort is normal. No respiratory distress.     Breath sounds: Normal breath sounds. No wheezing or rales.  Chest:     Chest wall: No tenderness.  Abdominal:     General: Bowel sounds are normal.     Palpations: Abdomen is soft.  Musculoskeletal:        General: Normal range of motion.     Cervical back: Normal range of motion and neck supple.  Lymphadenopathy:     Cervical: No cervical adenopathy.  Skin:    General: Skin is warm and dry.     Comments: Much improved facial acne.   Neurological:     Mental Status: He is alert and oriented to person, place, and time.     Cranial Nerves: No cranial nerve deficit.     Comments: Patient is at his neurological baseline.   Psychiatric:        Mood and Affect: Mood is anxious.        Behavior: Behavior normal.        Thought Content: Thought content normal.        Judgment: Judgment normal.    Assessment/Plan:  1. Uncontrolled type 1 diabetes mellitus with hyperglycemia ([place]) Managed per endocrinology. Renew prescription for lancets for frequent blood sugar testing.  - Accu-Chek FastClix Lancets MISC; CHECK BLOOD SUGAR 6 TIMES DAILY - E11.9  Dispense: 204 each; Refill:  6  2. Autistic disorder, active Stable.     General Counseling: Ellington verbalizes understanding of the findings of todays visit and agrees with plan of treatment. I have discussed any further diagnostic evaluation that may be needed or ordered today. We also reviewed his medications today. he has been encouraged to call the office with any questions or concerns that should arise related to todays visit.   This patient was seen by [place] with Dr Lavera Guise as a part of collaborative care agreement  Meds ordered this encounter  Medications  . DISCONTD: Accu-Chek FastClix Lancets MISC    Sig: CHECK BLOOD SUGAR 6 TIMES DAILY - E11.9    Dispense:  204 each    Refill:  6    Order Specific Question:   Supervising Provider    Answer:   Lavera Guise [[id number]]  . Accu-Chek FastClix Lancets MISC    Sig: CHECK BLOOD SUGAR 6 TIMES DAILY - E11.9    Dispense:  204 each    Refill:  6    Order Specific Question:   Supervising Provider    Answer:   Lavera Guise [[id number]]    Time spent: 80 Minutes      Dr Lavera Guise Internal medicine

## 2019-07-15 ENCOUNTER — Other Ambulatory Visit (INDEPENDENT_AMBULATORY_CARE_PROVIDER_SITE_OTHER): Payer: Self-pay | Admitting: "Endocrinology

## 2019-07-22 ENCOUNTER — Other Ambulatory Visit: Payer: Self-pay

## 2019-07-22 ENCOUNTER — Ambulatory Visit (INDEPENDENT_AMBULATORY_CARE_PROVIDER_SITE_OTHER): Payer: Medicaid Other | Admitting: "Endocrinology

## 2019-07-22 DIAGNOSIS — E063 Autoimmune thyroiditis: Secondary | ICD-10-CM | POA: Diagnosis not present

## 2019-07-22 DIAGNOSIS — E1065 Type 1 diabetes mellitus with hyperglycemia: Secondary | ICD-10-CM | POA: Diagnosis not present

## 2019-07-22 DIAGNOSIS — E10649 Type 1 diabetes mellitus with hypoglycemia without coma: Secondary | ICD-10-CM

## 2019-07-22 DIAGNOSIS — E1042 Type 1 diabetes mellitus with diabetic polyneuropathy: Secondary | ICD-10-CM

## 2019-07-22 DIAGNOSIS — F840 Autistic disorder: Secondary | ICD-10-CM

## 2019-07-22 DIAGNOSIS — E049 Nontoxic goiter, unspecified: Secondary | ICD-10-CM

## 2019-07-22 MED ORDER — ACCU-CHEK FASTCLIX LANCETS MISC: 6 refills | Status: DC

## 2019-07-22 NOTE — Progress Notes (Signed)
Subjective:  Patient Name: Johnny [name] Date of Birth: 1996-05-04  MRN: [id number]  Johnny [name]  presents at today's televisit for follow-up evaluation and management  of his T1DM, hypoglycemia, autism, ADHD, possible mental retardation, goiter, thyroiditis, adjustment reaction, and family history of autoimmune thyroid disease.  HISTORY OF PRESENT ILLNESS:   Johnny [name] is a 23 y.o. Caucasian young man.   Johnny [name] was accompanied by his mother.  60. Johnny [name] was seen at [facility] in [place], [place], on 06/26/13 for complaints of weight loss, fatigue, personality change, polyuria, polydipsia, and vomiting. New-onset DM and DKA were diagnosed. He received fluids at the ED at [facility], was transferred to [facility], and was admitted to the PICU for evaluation and management of new-onset T1DM, DKA, dehydration, and ketonuria in the setting of pre-existing autism, ADHD, and possible mental retardation. Significantly abnormal lab results included: Venous pH 7.257, serum potassium 3.0, serum CO2 15, serum glucose 257, hemoglobin A1c 13.7%, C-peptide 0.19 (normal 0.80-3.09), anti-glutamic acid decarboxylase (GAD) antibody > 30 (normal < 1.0), anti-islet cell antibody 80 (normal < 5). TSH was 2.163, free T4 0.82, free T3 1.8 (normal 2.3-4.2). He was treated initially with infusions of insulin and fluids. When his DKA resolved, he was converted to a multiple daily injection (MDI) insulin plan, using Lantus as a basal insulin and Novolog aspart as a bolus insulin at mealtimes, bedtime, and 2 AM if needed. 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   2. Johnny [name] was discharged from [facility] on 07/04/13. He and his mother participated in our [place] education program with Ms. [name] Eaton, RN. 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 When he went into the honeymoon period his Lantus dose was decreased to 6 units at night.  Since then, however, he has exited the honeymoon period and has had a gradual but progressive increase in his exogenous insulin requirement. He started his Dexcom G5 CGM in early 2016. He was converted to an Omnipod pump on 01/16/16. He was subsequently converted to a Dexcom G6.  3. Johnny [name]'s last [facility] visit was on 04/14/19. At that visit I continued all of his pump settings.   A. In the interim he has been healthy. His allergies are doing better. He has only had to take Benadryl twice since moving to [place], [place].     B. His BGs have been "kinda chaotic during the move and the holidays".  He had a 53 one night and it took 60 grams of carbs to raise his BG successfully. Most of his low BGs occurred in the morning. Most of his other BGs have been less than 300. He did have a 308. He has been more physically active during the family's move and household re-organization.    C. His Omnipod pump has been working well overall. His Dexcom G6 has also been working well. Mom has continued to do FSBG testing using his Fastclix lancets. Mom has to reduce his food boluses quite frequently.   D. Johnny [name] has not had many social interactions since he has been in the pandemic stay at home mode. He still remains completely dependent on his mother for all of his basic needs.   E. His appetite is good. Mom still controls the number of carbs he takes in.    4. [name]:  Constitutional: Johnny [name] feels "good". He likes his new home.  Eyes: Vision seems to be good when he  wears his glasses. He had his last eye appointment in May 2019. The eye doctor did not see any signs of DM damage.   Neck: Johnny [name] has not had any pains or swelling in the front part of his neck. There are no other recognized problems of the anterior neck.  Heart: There are no recognized heart problems. The ability to perform physical activities seems normal for his limited  level of physical activity.  Gastrointestinal: He is not as hungry. He  sometimes has postprandial bloating. Bowel movents seem normal. There are no recognized GI problems. Legs: Muscle mass and strength seem normal. He can perform his usual ADLs and other physical activities without obvious discomfort. He has no numbness, tingling, burning, or pains. No edema is noted.  Feet: The skin on his feet is still dry. The dermatologist diagnosed psoriasis. He is no longer routinely applying a prescription lotion. There are no other obvious foot problems. No edema is noted.  Neurologic: There are no recognized problems with muscle movement and strength, sensation, or coordination. Hypoglycemia: Mom says that he has had more frequent low BGs in the mornings.   5. BG printout: No data available.   6. CGM printout: No data available  PAST MEDICAL, FAMILY, AND SOCIAL HISTORY    Past Medical History:  Diagnosis Date  . ADHD (attention deficit hyperactivity disorder)   . Allergic rhinitis   . Autism   . Diabetes mellitus without complication ([place])     Family History  Problem Relation Age of Onset  . Hypothyroidism Mother   . Kidney disease Mother        Mom unsure what her kidney issues are  . Autism Sister   . Mental retardation Sister   . Cancer Maternal Grandfather   . Diabetes Paternal Grandmother        Died at age 40.  . Other Other        Paternal great aunt with "hypoglycemia" unsure why     Current Outpatient Medications:  .  Accu-Chek FastClix Lancets MISC, CHECK BLOOD SUGAR 6 TIMES DAILY - E11.9, Disp: 204 each, Rfl: 6 .  Continuous Blood Gluc Receiver (Johnny [name]) DEVI, 1 kit by Does not apply route as needed., Disp: 1 Device, Rfl: 0 .  Continuous Blood Gluc Sensor (DEXCOM G6 SENSOR) MISC, USE AS DIRECTED CHANGE EVERY 10 DAYS, Disp: 3 each, Rfl: 5 .  Continuous Blood Gluc Transmit (DEXCOM G6 TRANSMITTER) MISC, 1 kit by Does not apply route as needed., Disp: 1 each, Rfl: 5 .  diphenhydrAMINE (BENADRYL) 25 MG tablet, Take 50 mg by mouth every  6 (six) hours as needed for itching or allergies., Disp: , Rfl:  .  glucagon (GLUCAGEN HYPOKIT) 1 MG SOLR injection, Inject 15m IM for extreme hypoglycemia., Disp: 2 each, Rfl: 5 .  insulin aspart (NOVOLOG FLEXPEN) 100 UNIT/ML FlexPen, Use up to 50 units daily in the event pump fails, Disp: 15 mL, Rfl: 5 .  insulin aspart (NOVOLOG) 100 UNIT/ML injection, ADMINISTER 200 UNITS VIA INSULIN PUMP EVERY 48 HOURS, Disp: 40 mL, Rfl: 5 .  Insulin Glargine (LANTUS SOLOSTAR) 100 UNIT/ML Solostar Pen, Use up to 50 units daily in the event pump fails, Disp: 15 mL, Rfl: 5 .  Urine Glucose-Ketones Test STRP, If urine output and extreme thirst increases, collect urine and check with strip as directed. Disp 1 vial, Disp: 20 each, Rfl: 3  Allergies as of 07/22/2019  . (No Known Allergies)     reports that he has  never smoked. He has never used smokeless tobacco. He reports that he does not drink alcohol. Pediatric History  Patient Parents  . [name] (Mother)   Other Topics Concern  . Not on file  Social History Narrative   Lives with Mom and Sister (17y).  Mom quit smoking 18 years ago.  She has 2 other grown children.  They have a dog at home.  No recent travel.   1. School and Family: He completed his education program. He has health insurance through [facility]. Mom has health insurance as well. Family moved to [place] on 05/24/19. Mom wants me to continue to see [facility], but we also discussed transferring his care to an adult endocrinologist in the [place] area, such as [facility].  2. Activities: His play and video games.  3. Primary Care Provider: His new PCP is a NP named Leretha Pol, at the [facility] in [place].  4. Psychiatrist: None at present  REVIEW OF SYSTEMS: There are no other significant problems involving Johnny [name]'s other body systems.   Objective:   There were no vitals taken for this visit.  Growth percentile SmartLinks can only be used for patients  less than 84 years old.  Wt Readings from Last 3 Encounters:  07/14/19 157 lb (71.2 kg)  04/14/19 161 lb 6.4 oz (73.2 kg)  09/25/18 162 lb (73.5 kg)    Ht Readings from Last 3 Encounters:  07/14/19 '5\' 6"'  (1.676 m)  04/14/19 5' 6.42" (1.687 m)  09/25/18 5' 6.75" (1.695 m)    HC Readings from Last 3 Encounters:  No data found for [facility]   Facility age limit for growth percentiles is 20 years.  There is no height or weight on file to calculate BMI. No height and weight on file for this encounter.  There is no height or weight on file to calculate BSA.  [facility] [place] [facility] 157 pounds, a loss of 4 pounds since hs last visit.   LAB DATA:  No results found for this or any previous visit (from the past 504 hour(s)).   Labs 04/14/19: HbA1c 8.2%, CBG 307  Labs 03/31/19: TSH 1.20, free T4 1.1, free T3 3.6; CMP normal; cholesterol 111, triglycerides 94, HDL 44, LDL 94;  microalbumin/creatinine ratio 3  Labs 08/07/18: HbA1c 8.8%, CBG 204  Labs 05/07/18: HbA1c 9.0%, CBG 171  Labs 04/16/18: TSH 2.081  Labs 04/15/18: vpH 7.07; serum CO2 9, creatinine 1.49; Urine glucose >500 and ketones 80  Labs 02/07/18: HbA1c 10.0%  Labs 02/05/18: CBG 251  Labs 12/03/17: CBG 334  Labs 09/30/17: HbA1c 8.7%, CBG 254  Labs 07/24/17: CBG 263; TSH 2.18, free T4 1.1, free T3 3.3; urine microalbumin/creatinine ration 2; CMP normal except glucose 263  Labs 05/07/17: HbA1c 9.3%, CBG 246  Labs 03/04/17: CBG 494. He had not bolused after breakfast due to mom putting in a new pod just before they left home to drive to his appointment today.  Labs 12/26/16: HbA1c 10.2%, CBG 289  Labs 09/25/16: HbA1c 9.5%, CBG 261  Labs 06/27/16: CBG 114; TSH 1.54, free T4 1.1, free T3 3.4; CMP normal; urine microalbumin/creatinine ratio 2; cholesterol 108, triglycerides 153, HDL 43, LDL 34  Labs 04/10/16: HbA1c 9.5%, CBG 145  Labs 02/01/16: CBG 391, TSH 1.72  Labs 12/21/15: HbA1c  8.4%  Labs 10/10/15: HbA1c 8.2%.  Labs 08/09/15: HbA1c 8.5%  Labs 1012/16: HbA1c 8.8%; TSH 2.624, free T4 0.99, free T3 3.4; urine microalbumin/creatinine ratio <  0.2; CMP normal  Labs 01/27/15: HbA1c 8.8%  Labs 10/25/14: Hemoglobin A1c 8.2%, compared with 7.9% at the last visit and with 8.4% at the prior visit.    Labs 10/22/13: CMP normal; C-peptide 0.65, increased from 0.19 at diagnosis (normal 0.80-3.90); TSH 1.265, free T4 1.01, free T3 3.4, TPO antibody 10.4    Assessment and Plan:   ASSESSMENT:  1-3. T1DM/hypoglycemia/hypoglycemia unawareness  A. Since exiting the honeymoon period, Isreal has required more and more exogenous insulin than he needed before. His BGs have been much more variable over time.   B. His current basal rate settings and bolus settings have helped to control his BGs. He still sometimes may have inaccurate carb counts at certain meals.   C. He sometimes has low BGs in the mornings since moving to [place]. He may benefit from lower basal rates during the night.   D. Ms. Drema Dallas is doing a very good job overall of taking care of Johnny [name].     4-5. Goiter/thyroiditis:   A. He has the personal history of autoimmune T1DM. He also has the family history of autoimmune thyroid disease.  B. He continues to have waxing and waning of thyroid gland size. The pattern of waxing and waning of thyroid gland size and thyroid lobe sizes is c/w evolving Hashimoto's thyroiditis. It is very likely that he will become hypothyroid in the future.   C.  He was euthyroid in April 2015, in October 2016, in January 2019, in September 2019, and again in September 2020. He is clinically euthyroid now.   6. Adjustment reaction: Things are going pretty well overall. Mom is trying very hard to take good care of Johnny [name].  7. Autism/ADHD/ mental retardation: Johnny [name]'s autism has improved somewhat in that he is more social and outgoing. However, he remains completely dependent upon mom for his meals,  shelter, clothing, recreational activities, physical activities, and T1DM care. 8. Peripheral neuropathy: His neuropathy was not evident at his last visit or today. He says he is not having any symptoms today.   PLAN:  1. Diagnostic: Use the Dexcom readings. Call Johnny [name] next week to print out the [facility].  2. Therapeutic: Continue Small bedtime snack.  New basal rates: MN: 1.45 -> 1.35 4 AM: 1.60 -> 1.50 8 AM: 1.15 Noon: 0.95 6 PM: 1.25  Continue ISF: 50  Continue BG targets: MN: 180 6 AM: 150 9 PM: 180  Continue ICRs: MN: 15: 6 AM:12 Noon: 15  3. Patient education: We discussed his elevated BGs, his hypoglycemia, and all of the above at length.  Mom is again more concerned about when his BGs go too low.  I again asked mom to call me if Zeus has any BGs during the night or in the mornings that are <80 and unexplained. I also asked her to keep a log of unusual high BGs or lower BGs and the causes. We discussed possibly seeking out an adult endocrinologist for Johnny [name] in [place].  4. Follow-up: three months  Level of Service: This visit lasted in excess of 55 minutes. More than 50% of the visit was devoted to counseling.   Tillman Sers, MD, CDE Adult and Pediatric Endocrinology  This is a Pediatric Specialist E-Visit follow up consult provided via Obie Dredge and his mother, Ms. [name] consented to an E-Visit consult today.  Location of patient: Nikolai and Ms Drema Dallas are at their home.  Location of provider: Tillman Sers, MD is at his office. Patient was referred by Ronnell Freshwater,  NP   The following participants were involved in this E-Visit: Mitzi Hansen, Ms. Drema Dallas, and Dr. Tobe Sos  Chief Complain/ Reason for E-Visit today: T1DM, hypoglycemia, peripheral neuropathy, goiter, thyroiditis, autism  Total time on call: 45 minutes Follow up: 3 months

## 2019-07-22 NOTE — Patient Instructions (Signed)
Follow up visit in 3 months. 

## 2019-08-07 ENCOUNTER — Telehealth (INDEPENDENT_AMBULATORY_CARE_PROVIDER_SITE_OTHER): Payer: Self-pay

## 2019-08-07 NOTE — Telephone Encounter (Signed)
noted Paper work for [facility] has been faxed

## 2019-08-23 IMAGING — CR DG CHEST 2V
1 series · 2 of 2 positions shown · non-contrast
Comparison: None.

CLINICAL DATA: Cough, fever, sore throat

EXAM:
CHEST - 2 VIEW

[Series 1: dg chest 2 view · 0.14mm/px · 2 of 2 slices shown]
[im 1/2]
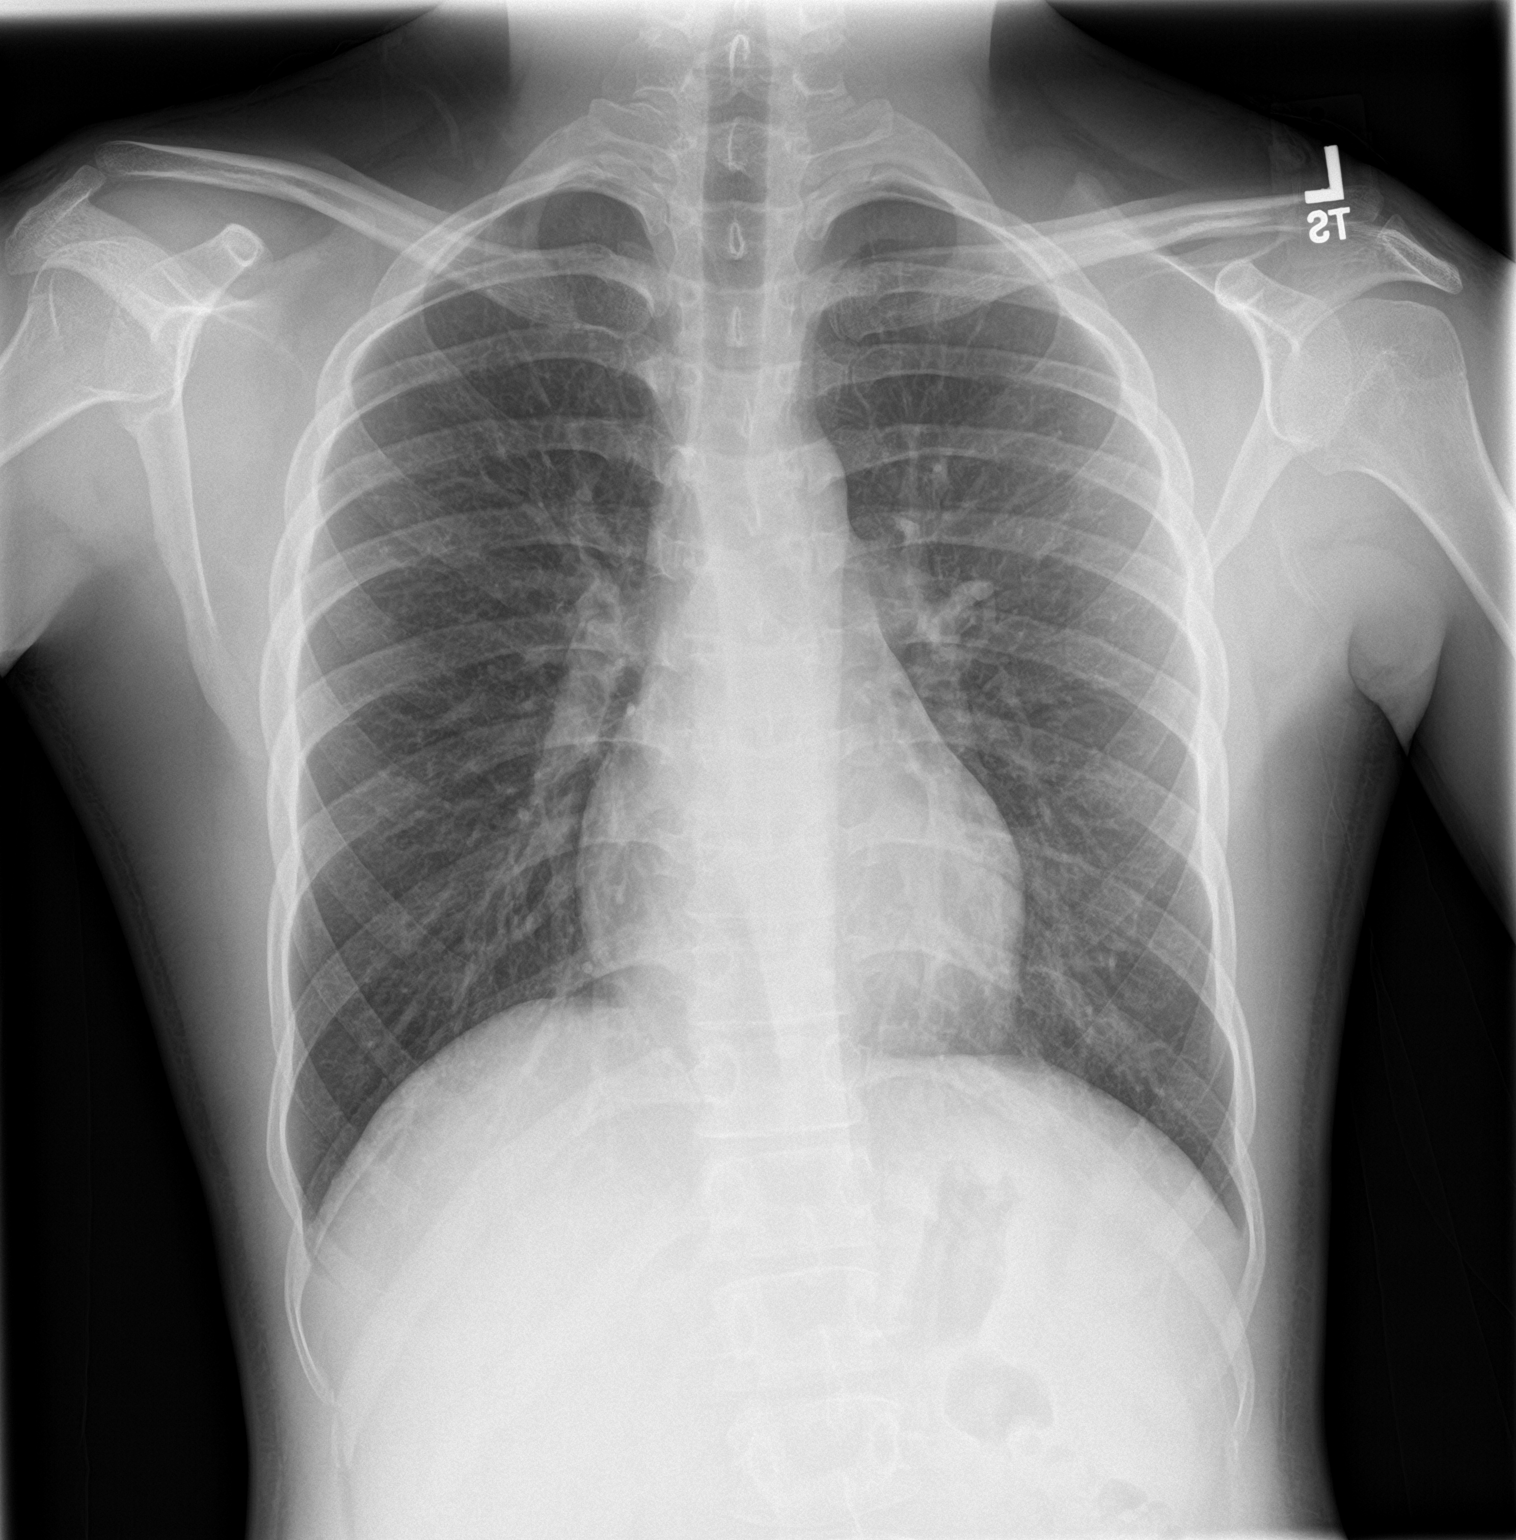
[im 2/2]
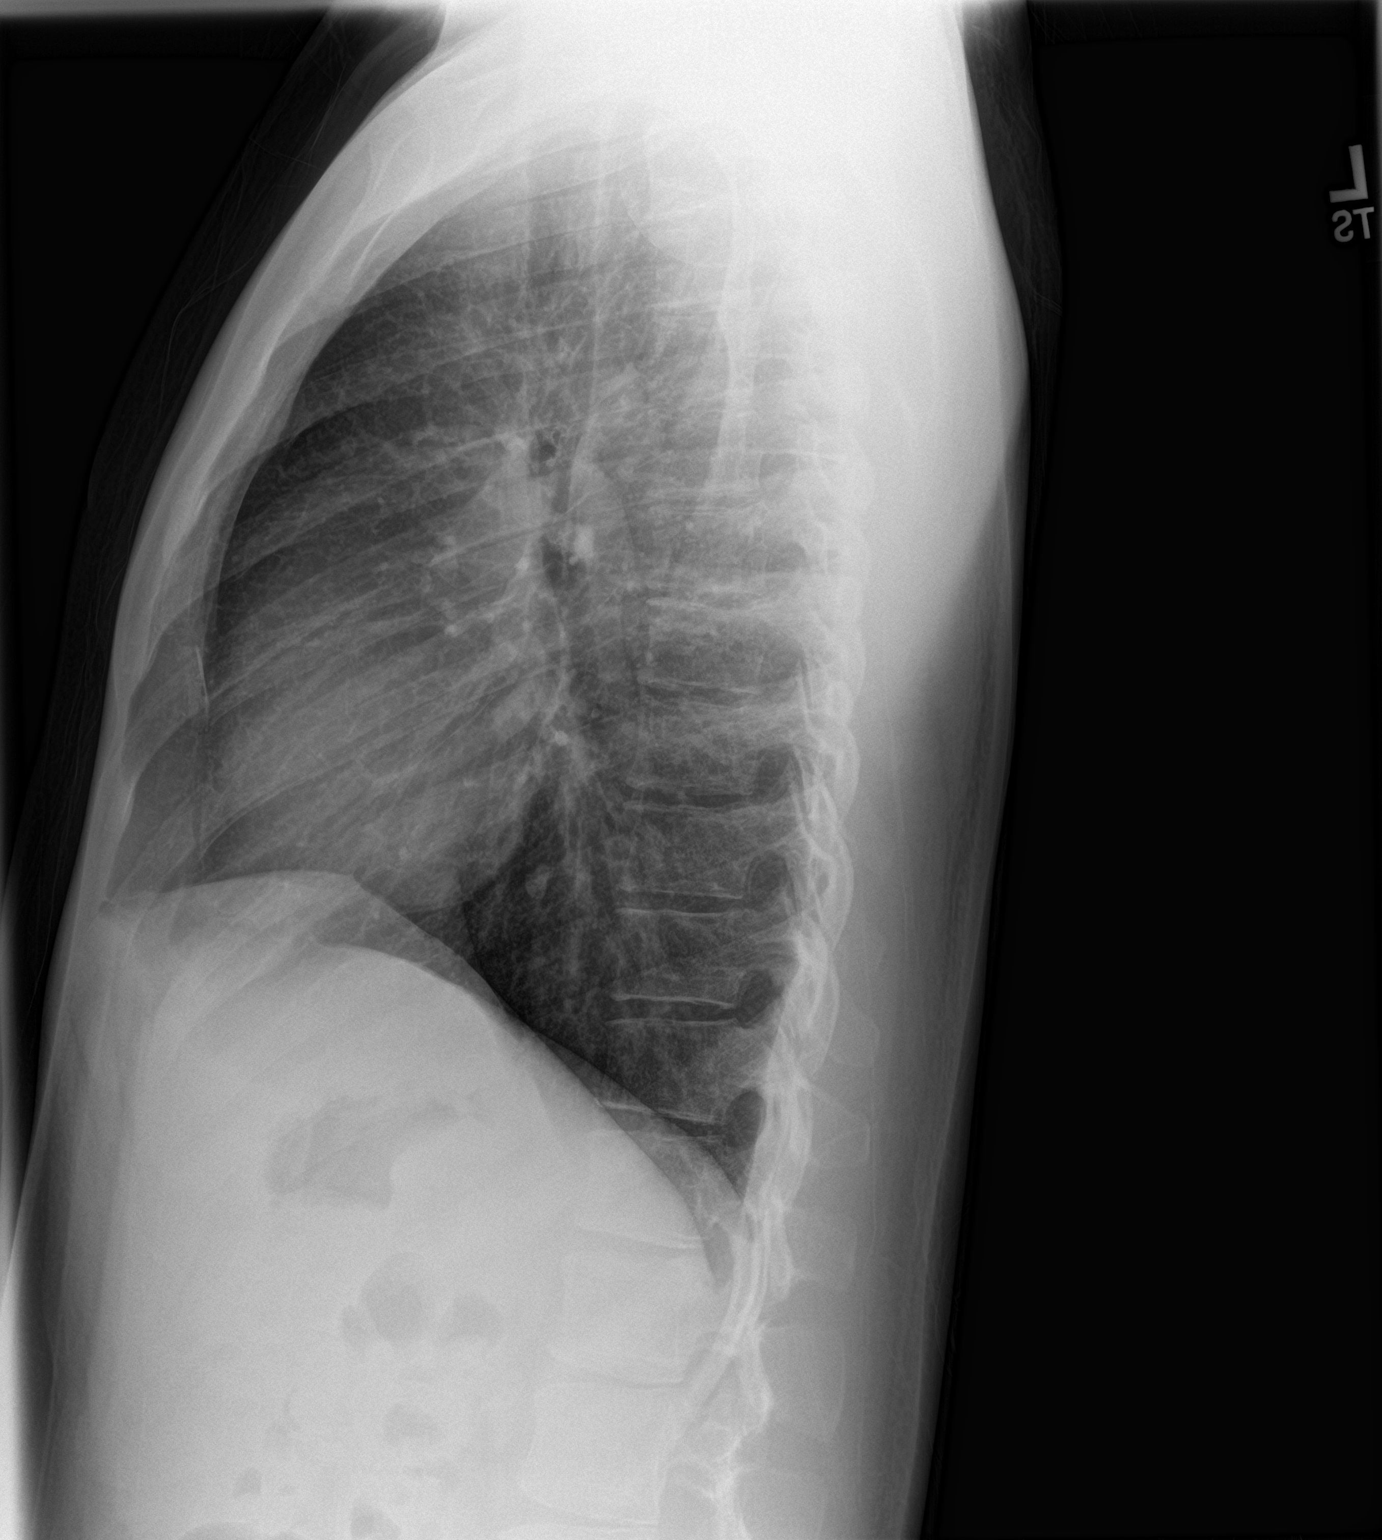

[2 of 2 positions shown; findings below may reference images not displayed]

FINDINGS: No active infiltrate or effusion is seen. Mediastinal and hilar
contours are unremarkable. The heart is within normal limits in
size. No bony abnormality is seen.
IMPRESSION: No active cardiopulmonary disease.

## 2019-08-26 ENCOUNTER — Other Ambulatory Visit: Payer: Self-pay | Admitting: "Endocrinology

## 2019-10-02 ENCOUNTER — Telehealth (INDEPENDENT_AMBULATORY_CARE_PROVIDER_SITE_OTHER): Payer: Self-pay | Admitting: "Endocrinology

## 2019-10-02 DIAGNOSIS — E1065 Type 1 diabetes mellitus with hyperglycemia: Secondary | ICD-10-CM

## 2019-10-02 DIAGNOSIS — IMO0002 Reserved for concepts with insufficient information to code with codable children: Secondary | ICD-10-CM

## 2019-10-02 MED ORDER — INSULIN ASPART 100 UNIT/ML ~~LOC~~ SOLN: SUBCUTANEOUS | 5 refills | Status: DC

## 2019-10-02 MED ORDER — NOVOLOG FLEXPEN 100 UNIT/ML ~~LOC~~ SOPN: PEN_INJECTOR | SUBCUTANEOUS | 5 refills | Status: DC

## 2019-10-02 NOTE — Telephone Encounter (Signed)
Who's calling (name and relationship to patient) : [facility]   Best contact number: [phone]  Provider they see: Dr. Fransico Michael  Reason for call: A new prescription for novolog is needed.   Call ID:      PRESCRIPTION REFILL ONLY  Name of prescription: novolog  Pharmacy: [facility]

## 2019-10-05 MED ORDER — LANTUS SOLOSTAR 100 UNIT/ML ~~LOC~~ SOPN: PEN_INJECTOR | SUBCUTANEOUS | 5 refills | Status: DC

## 2019-10-29 ENCOUNTER — Ambulatory Visit (INDEPENDENT_AMBULATORY_CARE_PROVIDER_SITE_OTHER): Payer: Medicaid Other | Admitting: "Endocrinology

## 2019-12-02 ENCOUNTER — Other Ambulatory Visit: Payer: Self-pay

## 2019-12-02 ENCOUNTER — Telehealth (INDEPENDENT_AMBULATORY_CARE_PROVIDER_SITE_OTHER): Payer: Medicaid Other | Admitting: "Endocrinology

## 2019-12-02 ENCOUNTER — Telehealth (INDEPENDENT_AMBULATORY_CARE_PROVIDER_SITE_OTHER): Payer: Self-pay

## 2019-12-02 DIAGNOSIS — E11649 Type 2 diabetes mellitus with hypoglycemia without coma: Secondary | ICD-10-CM | POA: Diagnosis not present

## 2019-12-02 DIAGNOSIS — E1065 Type 1 diabetes mellitus with hyperglycemia: Secondary | ICD-10-CM | POA: Diagnosis not present

## 2019-12-02 DIAGNOSIS — E063 Autoimmune thyroiditis: Secondary | ICD-10-CM

## 2019-12-02 DIAGNOSIS — F840 Autistic disorder: Secondary | ICD-10-CM

## 2019-12-02 DIAGNOSIS — E049 Nontoxic goiter, unspecified: Secondary | ICD-10-CM | POA: Diagnosis not present

## 2019-12-02 DIAGNOSIS — E1042 Type 1 diabetes mellitus with diabetic polyneuropathy: Secondary | ICD-10-CM

## 2019-12-02 NOTE — Telephone Encounter (Signed)
Called mom to get Dexcom code to download Dexcom data.

## 2019-12-02 NOTE — Progress Notes (Signed)
Subjective:  Patient Name: Johnny [name] Date of Birth: [birth year]  MRN: [id number]  Johnny [name]  presents at today's televisit for follow-up evaluation and management  of his T1DM, hypoglycemia, autism, ADHD, possible mental retardation, goiter, thyroiditis, adjustment reaction, and family history of autoimmune thyroid disease.  HISTORY OF PRESENT ILLNESS:   Johnny [name] is a 24 y.o. Caucasian young man.   Johnny [name] was accompanied by his mother.  72. Johnny [name] was seen at [facility] in [place], [place], on 06/26/13 for complaints of weight loss, fatigue, personality change, polyuria, polydipsia, and vomiting. New-onset DM and DKA were diagnosed. He received fluids at the ED at [facility], was transferred to [facility], and was admitted to the PICU for evaluation and management of new-onset T1DM, DKA, dehydration, and ketonuria in the setting of pre-existing autism, ADHD, and possible mental retardation. Significantly abnormal lab results included: Venous pH 7.257, serum potassium 3.0, serum CO2 15, serum glucose 257, hemoglobin A1c 13.7%, C-peptide 0.19 (normal 0.80-3.09), anti-glutamic acid decarboxylase (GAD) antibody > 30 (normal < 1.0), anti-islet cell antibody 80 (normal < 5). TSH was 2.163, free T4 0.82, free T3 1.8 (normal 2.3-4.2). He was treated initially with infusions of insulin and fluids. When his DKA resolved, he was converted to a multiple daily injection (MDI) insulin plan, using Lantus as a basal insulin and Novolog aspart as a bolus insulin at mealtimes, bedtime, and 2 AM if needed. 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   2. Johnny [name] was discharged from [facility] on 07/04/13. He and his mother participated in our [place] education program with Ms. [name] Eaton, RN. 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 When he went into the honeymoon period his Lantus dose was decreased to 6 units at night.  Since then, however, he has exited the honeymoon period and has had a gradual but progressive increase in his exogenous insulin requirement. He started his Dexcom G5 CGM in early 2016. He was converted to an Omnipod pump on 01/16/16. He was subsequently converted to a Dexcom G6.  3. Johnny [name]'s last [facility] visit was on 07/22/19. At that visit I decreased his basal rates from midnight to 8 AM. Those changes helped to reduce his higher nocturnal BGs.   A. In the interim he has been healthy. His allergies are better since moving to [place], [place]. He has only had to take Benadryl occasionally since the move. His feet are still dry and red, but better. The dryness and redness are spreading up on one leg. Mom ran out of his prescription lotion for his psoriasis.   B. His BGs have been "normally up and down". Since reducing his basal rates from midnight to 8 AM, Johnny [name] has not has as many low BGs during the nights or early morning hours. He still sometimes has low BGs in the mornings, but most of his low BGs occur later in the day if he does not eat. He has had a few BGs in the 54 range and in the upper 70s. Most of his higher BGs occur after dinner. He has had a few BGs in the 400s.    C. His Omnipod pump has been working well overall. His Dexcom G6 has also been working well. Mom has continued to do FSBG testing using his Fastclix lancets. Mom has to reduce his food boluses quite frequently.   D. Shravan has not had the covid vaccinations. Neither has mom because she is  afraid of the vaccines. I encouraged both of them to get the vaccine.    E. His appetite is good. Mom still controls the number of carbs he takes in.   Johnny [name] still remains completely dependent on his mother for all of his basic needs.     4. [name]:  Constitutional: Johnny [name] feels "good". He likes his new home.  Eyes: Vision seems to be good when he wears his glasses. He had his last eye appointment in May 2019. The eye doctor  did not see any signs of DM damage.   Neck: Johnny [name] has not had any pains or swelling in the front part of his neck. There are no other recognized problems of the anterior neck.  Heart: There are no recognized heart problems. The ability to perform physical activities seems normal for his limited  level of physical activity.  Gastrointestinal: He is still hungry. He no longer has postprandial bloating. Bowel movents seem normal. There are no recognized GI problems. Legs: Muscle mass and strength seem normal. He can perform his usual ADLs and other physical activities without obvious discomfort. He has no numbness, tingling, burning, or pains. No edema is noted.  Feet: as above. The skin on his feet is still dry. His dermatologist in [place] previously diagnosed psoriasis. He is no longer routinely applying a prescription lotion. There are no other obvious foot problems. No edema is noted.  Neurologic: There are no recognized problems with muscle movement and strength, sensation, or coordination. Hypoglycemia: Mom says that he has had about the same amount of low BGs.    5. BG printout: No data available.   6. CGM printout: No data available  PAST MEDICAL, FAMILY, AND SOCIAL HISTORY    Past Medical History:  Diagnosis Date  . ADHD (attention deficit hyperactivity disorder)   . Allergic rhinitis   . Autism   . Diabetes mellitus without complication ([place])     Family History  Problem Relation Age of Onset  . Hypothyroidism Mother   . Kidney disease Mother        Mom unsure what her kidney issues are  . Autism Sister   . Mental retardation Sister   . Cancer Maternal Grandfather   . Diabetes Paternal Grandmother        Died at age 61.  . Other Other        Paternal great aunt with "hypoglycemia" unsure why     Current Outpatient Medications:  .  Accu-Chek FastClix Lancets MISC, CHECK BLOOD SUGAR 6 TIMES DAILY - E11.9, Disp: 204 each, Rfl: 6 .  Continuous Blood Gluc Receiver  ([place]) DEVI, 1 kit by Does not apply route as needed., Disp: 1 Device, Rfl: 0 .  Continuous Blood Gluc Sensor (DEXCOM G6 SENSOR) MISC, USE AS DIRECTED CHANGE EVERY 10 DAYS, Disp: 3 each, Rfl: 5 .  Continuous Blood Gluc Transmit (DEXCOM G6 TRANSMITTER) MISC, 1 kit by Does not apply route as needed., Disp: 1 each, Rfl: 5 .  diphenhydrAMINE (BENADRYL) 25 MG tablet, Take 50 mg by mouth every 6 (six) hours as needed for itching or allergies., Disp: , Rfl:  .  glucagon (GLUCAGEN HYPOKIT) 1 MG SOLR injection, INJECT 1 MG IN THE MUSCLE FOR EXTREME HYPOGLYCEMIA, Disp: 2 each, Rfl: 5 .  insulin aspart (NOVOLOG FLEXPEN) 100 UNIT/ML FlexPen, Use up to 50 units daily in the event pump fails, Disp: 15 mL, Rfl: 5 .  insulin aspart (NOVOLOG) 100 UNIT/ML injection, ADMINISTER 200  UNITS VIA INSULIN PUMP EVERY 48 HOURS, Disp: 40 mL, Rfl: 5 .  insulin glargine (LANTUS SOLOSTAR) 100 UNIT/ML Solostar Pen, Use up to 50 units daily in the event pump fails, Disp: 15 mL, Rfl: 5 .  Urine Glucose-Ketones Test STRP, If urine output and extreme thirst increases, collect urine and check with strip as directed. Disp 1 vial, Disp: 20 each, Rfl: 3  Allergies as of 12/02/2019  . (No Known Allergies)     reports that he has never smoked. He has never used smokeless tobacco. He reports that he does not drink alcohol. Pediatric History  Patient Parents  . [name] (Mother)   Other Topics Concern  . Not on file  Social History Narrative   Lives with Mom and Sister (17y).  Mom quit smoking 18 years ago.  She has 2 other grown children.  They have a dog at home.  No recent travel.   1. School and Family: He completed his education program. He has health insurance through [facility]. Mom has health insurance as well. Family moved to [place] on 05/24/19. Mom wants me to continue to see Johnny [name], but we also discussed transferring his care to an adult endocrinologist in the [place] area, such as [facility].  2. Activities: His play and video games.  3. Primary Care Provider: His new PCP is a NP named Leretha Pol, at the [facility] in [place].  4. Psychiatrist: None at present  REVIEW OF SYSTEMS: There are no other significant problems involving Johnny [name]'s other body systems.   Objective:   There were no vitals taken for this visit.  Growth percentile SmartLinks can only be used for patients less than 87 years old.  Wt Readings from Last 3 Encounters:  07/14/19 157 lb (71.2 kg)  04/14/19 161 lb 6.4 oz (73.2 kg)  09/25/18 162 lb (73.5 kg)    Ht Readings from Last 3 Encounters:  07/14/19 '5\' 6"'  (1.676 m)  04/14/19 5' 6.42" (1.687 m)  09/25/18 5' 6.75" (1.695 m)    HC Readings from Last 3 Encounters:  No data found for [name]   Facility age limit for growth percentiles is 20 years.  There is no height or weight on file to calculate BMI. No height and weight on file for this encounter.  There is no height or weight on file to calculate BSA.   His weight at home today, fully clothed but without shoes, is 159.6 pounds.    LAB DATA:  No results found for this or any previous visit (from the past 504 hour(s)).   Labs 04/14/19: HbA1c 8.2%, CBG 307  Labs 03/31/19: TSH 1.20, free T4 1.1, free T3 3.6; CMP normal; cholesterol 111, triglycerides 94, HDL 44, LDL 94;  microalbumin/creatinine ratio 3  Labs 08/07/18: HbA1c 8.8%, CBG 204  Labs 05/07/18: HbA1c 9.0%, CBG 171  Labs 04/16/18: TSH 2.081  Labs 04/15/18: vpH 7.07; serum CO2 9, creatinine 1.49; Urine glucose >500 and ketones 80  Labs 02/07/18: HbA1c 10.0%  Labs 02/05/18: CBG 251  Labs 12/03/17: CBG 334  Labs 09/30/17: HbA1c 8.7%, CBG 254  Labs 07/24/17: CBG 263; TSH 2.18, free T4 1.1, free T3 3.3; urine microalbumin/creatinine ration 2; CMP normal except glucose 263  Labs 05/07/17: HbA1c 9.3%, CBG 246  Labs 03/04/17: CBG 494. He had not bolused after breakfast due to mom putting in a new pod just before they left  home to drive to his appointment today.  Labs 12/26/16: HbA1c 10.2%, CBG 289  Labs 09/25/16: HbA1c 9.5%, CBG 261  Labs 06/27/16: CBG 114; TSH 1.54, free T4 1.1, free T3 3.4; CMP normal; urine microalbumin/creatinine ratio 2; cholesterol 108, triglycerides 153, HDL 43, LDL 34  Labs 04/10/16: HbA1c 9.5%, CBG 145  Labs 02/01/16: CBG 391, TSH 1.72  Labs 12/21/15: HbA1c 8.4%  Labs 10/10/15: HbA1c 8.2%.  Labs 08/09/15: HbA1c 8.5%  Labs 1012/16: HbA1c 8.8%; TSH 2.624, free T4 0.99, free T3 3.4; urine microalbumin/creatinine ratio <0.2; CMP normal  Labs 01/27/15: HbA1c 8.8%  Labs 10/25/14: Hemoglobin A1c 8.2%, compared with 7.9% at the last visit and with 8.4% at the prior visit.    Labs 10/22/13: CMP normal; C-peptide 0.65, increased from 0.19 at diagnosis (normal 0.80-3.90); TSH 1.265, free T4 1.01, free T3 3.4, TPO antibody 10.4    Assessment and Plan:   ASSESSMENT:  1-3. T1DM/hypoglycemia/hypoglycemia unawareness  A. Since exiting the honeymoon period, Adolphe has required more and more exogenous insulin than he needed before. His BGs have been much more variable over time.   B. His current basal rate settings and bolus settings have helped to control his BGs. He still sometimes may have inaccurate carb counts at certain meals.   C. He is not having as many low BGs during the night and in the mornings, but still sometimes has low BGs later in the day if he does not eat.   D. Ms. Drema Dallas is doing a very good job overall of taking care of Braidyn.     4-5. Goiter/thyroiditis:   A. He has the personal history of autoimmune T1DM. He also has the family history of autoimmune thyroid disease.  B. His pattern of waxing and waning of thyroid gland size and thyroid lobe sizes is c/w evolving Hashimoto's thyroiditis. It is very likely that he will become hypothyroid in the future.   C.  He was euthyroid in April 2015, in October 2016, in January 2019, in September 2019, and again in September 2020. He is  clinically euthyroid now.   6. Adjustment reaction: Things are going pretty well overall. Mom is trying very hard to take good care of Johnny [name].  7. Autism/ADHD/ mental retardation: Johnny [name]'s autism has improved somewhat in that he is more social and outgoing. However, he remains completely dependent upon mom for his meals, shelter, clothing, recreational activities, physical activities, and T1DM care. 8. Peripheral neuropathy: His neuropathy was not evident at his last clinic visit, in December 2020, and again in May 2021. He says he is not having any symptoms today.   PLAN:  1. Diagnostic: Obtain the Dexcom readings.   2. Therapeutic: Continue Small bedtime snack.   Continue basal rates: MN: 1.35 4 AM: 1.50 8 AM: 1.15 Noon: 0.95 6 PM: 1.25  Continue ISF: 50  Continue BG targets: MN: 180 6 AM: 150 9 PM: 180  Continue ICRs: MN: 15: 6 AM:12 Noon: 15  3. Patient education: We discussed his elevated BGs, his hypoglycemia, and all of the above at length. I again asked mom to call me if Digby has any BGs during the night or in the mornings that are <80 and unexplained. I also asked her to keep a log of unusual high BGs or lower BGs and the causes. We discussed possibly seeking out an adult endocrinologist for [place] in [place]. For the present, however, mom wants to bring Benson back to see me.   4. Follow-up: three months for an afternoon appointment.  Level of Service: This visit lasted in excess of 50 minutes. More  than 50% of the visit was devoted to counseling.   Tillman Sers, MD, CDE Adult and Pediatric Endocrinology  This is a Pediatric Specialist E-Visit follow up consult provided via Telephone. Einar Grad and his mother, Ms. [name] consented to an E-Visit consult today.  Location of patient: [place] and Ms Drema Dallas are at their home.  Location of provider: Tillman Sers, MD is at his office. Patient was referred by Ronnell Freshwater, NP   The following  participants were involved in this E-Visit: Mitzi Hansen, Ms. Drema Dallas, and Dr. Tobe Sos  Chief Complain/ Reason for E-Visit today: T1DM, hypoglycemia, peripheral neuropathy, goiter, thyroiditis, autism  Total time on call: 35 minutes Follow up: 3 months

## 2019-12-02 NOTE — Patient Instructions (Signed)
Follow up visit in 3 months. 

## 2019-12-08 ENCOUNTER — Other Ambulatory Visit (INDEPENDENT_AMBULATORY_CARE_PROVIDER_SITE_OTHER): Payer: Self-pay | Admitting: "Endocrinology

## 2020-01-08 ENCOUNTER — Telehealth: Payer: Self-pay

## 2020-01-08 NOTE — Telephone Encounter (Signed)
Lmom to confirm and screen for 01-12-20 ov. 

## 2020-01-11 ENCOUNTER — Telehealth: Payer: Self-pay

## 2020-01-11 NOTE — Telephone Encounter (Signed)
Confirmed appointment on 01/12/2020 and screened for covid. klh °

## 2020-01-12 ENCOUNTER — Other Ambulatory Visit: Payer: Self-pay

## 2020-01-12 ENCOUNTER — Ambulatory Visit: Payer: Medicaid Other | Admitting: Nurse Practitioner

## 2020-01-12 VITALS — BP 113/72 | HR 71 | Temp 97.3°F | Resp 16 | Ht 66.0 in | Wt 159.6 lb

## 2020-01-12 DIAGNOSIS — E1065 Type 1 diabetes mellitus with hyperglycemia: Secondary | ICD-10-CM | POA: Diagnosis not present

## 2020-01-12 DIAGNOSIS — F840 Autistic disorder: Secondary | ICD-10-CM

## 2020-01-12 DIAGNOSIS — L209 Atopic dermatitis, unspecified: Secondary | ICD-10-CM

## 2020-01-12 DIAGNOSIS — J309 Allergic rhinitis, unspecified: Secondary | ICD-10-CM

## 2020-01-12 DIAGNOSIS — Z0001 Encounter for general adult medical examination with abnormal findings: Secondary | ICD-10-CM

## 2020-01-12 MED ORDER — TRIAMCINOLONE ACETONIDE 0.025 % EX CREA: 1.0000 "application " | TOPICAL_CREAM | Freq: Two times a day (BID) | CUTANEOUS | 3 refills | Status: DC

## 2020-01-12 MED ORDER — CETIRIZINE HCL 10 MG PO TABS: 10.0000 mg | ORAL_TABLET | Freq: Every day | ORAL | 3 refills | Status: AC

## 2020-01-12 NOTE — Progress Notes (Signed)
[facility] [address], [place] [address]  Internal MEDICINE  Office Visit Note  Patient Name: Johnny [name]  [id number]  [id number]  Date of Service: 01/20/2020  Chief Complaint  Patient presents with   Annual Exam   Diabetes    Having very dry skin on top of both feet   Allergies    Has had unusual cough with allergies this season     The patient is here for health maintenance exam. He is type 1 diabetic. He does see endocrinology for this. He is concerned about dry skin on the tope if his feet. Has seen dermatologist for this in the past.  Nothing recommended was helping. Has been using something called "heeltastic." this does not seem to help very much.  Patient is type 1 diabetic. He has insulin pump and is followed by endocrinology.  Patient has autistic disorder. He is followed by neurology for this.   Pt is here for routine health maintenance examination  Current Medication: Outpatient Encounter Medications as of 01/12/2020  Medication Sig Note   Accu-Chek FastClix Lancets MISC CHECK BLOOD SUGAR 6 TIMES DAILY - E11.9    Continuous Blood Gluc Receiver (DEXCOM G6 RECEIVER) DEVI 1 kit by Does not apply route as needed.    Continuous Blood Gluc Sensor (DEXCOM G6 SENSOR) MISC USE AS DIRECTED CHANGE EVERY 10 DAYS    Continuous Blood Gluc Transmit (DEXCOM G6 TRANSMITTER) MISC 1 kit by Does not apply route as needed.    diphenhydrAMINE (BENADRYL) 25 MG tablet Take 50 mg by mouth every 6 (six) hours as needed for itching or allergies. 12/02/2019: PRN for allergies   glucagon (GLUCAGEN HYPOKIT) 1 MG SOLR injection INJECT 1 MG IN THE MUSCLE FOR EXTREME HYPOGLYCEMIA 12/02/2019: PRN   insulin aspart (NOVOLOG FLEXPEN) 100 UNIT/ML FlexPen Use up to 50 units daily in the event pump fails    insulin aspart (NOVOLOG) 100 UNIT/ML injection ADMINISTER 200 UNITS VIA INSULIN PUMP EVERY 48 HOURS    insulin glargine (LANTUS SOLOSTAR) 100 UNIT/ML Solostar Pen Use  up to 50 units daily in the event pump fails    Urine Glucose-Ketones Test STRP If urine output and extreme thirst increases, collect urine and check with strip as directed. Disp 1 vial    cetirizine (ZYRTEC) 10 MG tablet Take 1 tablet (10 mg total) by mouth daily.    triamcinolone (KENALOG) 0.025 % cream Apply 1 application topically 2 (two) times daily.    No facility-administered encounter medications on file as of 01/12/2020.    Surgical History: Past Surgical History:  Procedure Laterality Date   DENTAL REHABILITATION      Medical History: Past Medical History:  Diagnosis Date   ADHD (attention deficit hyperactivity disorder)    Allergic rhinitis    Autism    Diabetes mellitus without complication ([place])     Family History: Family History  Problem Relation Age of Onset   Hypothyroidism Mother    Kidney disease Mother        Mom unsure what her kidney issues are   Autism Sister    Mental retardation Sister    Cancer Maternal Grandfather    Diabetes Paternal Grandmother        Died at age 32.   Other Other        Paternal great aunt with "hypoglycemia" unsure why      Review of Systems  Constitutional: Negative for chills, fatigue and unexpected weight change.  HENT: Negative for congestion, postnasal drip,  rhinorrhea, sneezing and sore throat.   Respiratory: Negative for cough, chest tightness and shortness of breath.   Cardiovascular: Negative for chest pain and palpitations.  Gastrointestinal: Negative for abdominal pain, constipation, diarrhea, nausea and vomiting.  Endocrine: Negative for cold intolerance, heat intolerance, polydipsia and polyuria.       Patient on insulin pump due to type 1 diabetes. Followed by endocrinology.  Genitourinary: Negative for dysuria, flank pain, frequency and urgency.  Musculoskeletal: Negative for arthralgias, back pain, joint swelling and neck pain.  Skin: Negative for rash.       Dry skin with redness and  itchiness along the tops of both feet. Skin feels rough.   Allergic/Immunologic: Positive for environmental allergies and food allergies.  Neurological: Negative for dizziness, tremors, numbness and headaches.  Hematological: Negative for adenopathy. Does not bruise/bleed easily.  Psychiatric/Behavioral: Negative for behavioral problems (Depression), sleep disturbance and suicidal ideas. The patient is nervous/anxious.      Today's Vitals   01/12/20 1447  BP: 113/72  Pulse: 71  Resp: 16  Temp: (!) 97.3 F (36.3 C)  SpO2: 96%  Weight: 159 lb 9.6 oz (72.4 kg)  Height: '5\' 6"'  (1.676 m)   Body mass index is 25.76 kg/m.  Physical Exam Vitals and nursing note reviewed.  Constitutional:      General: He is not in acute distress.    Appearance: Normal appearance. He is well-developed. He is not diaphoretic.  HENT:     Head: Normocephalic and atraumatic.     Mouth/Throat:     Pharynx: No oropharyngeal exudate.  Eyes:     Pupils: Pupils are equal, round, and reactive to light.  Neck:     Thyroid: No thyromegaly.     Vascular: No JVD.     Trachea: No tracheal deviation.  Cardiovascular:     Rate and Rhythm: Normal rate and regular rhythm.     Pulses: Normal pulses.     Heart sounds: Normal heart sounds. No murmur heard.  No friction rub. No gallop.   Pulmonary:     Effort: Pulmonary effort is normal. No respiratory distress.     Breath sounds: Normal breath sounds. No wheezing or rales.  Chest:     Chest wall: No tenderness.  Abdominal:     General: Bowel sounds are normal.     Palpations: Abdomen is soft.     Tenderness: There is no abdominal tenderness.  Musculoskeletal:        General: Normal range of motion.     Cervical back: Normal range of motion and neck supple.  Lymphadenopathy:     Cervical: No cervical adenopathy.  Skin:    General: Skin is warm and dry.     Comments: Very rough and dry skin along the dorsal aspect of both feet. Skin red with no focal lesions.    Neurological:     Mental Status: He is alert and oriented to person, place, and time. Mental status is at baseline.     Cranial Nerves: No cranial nerve deficit.     Comments: Patient is at his neurological baseline.   Psychiatric:        Mood and Affect: Mood is anxious.        Behavior: Behavior normal.        Thought Content: Thought content normal.        Judgment: Judgment normal.   Assessment/Plan: 1. Encounter for general adult medical examination with abnormal findings Annual health maintenance exam today.   2.  Atopic dermatitis, unspecified type Trial triamcinolone cream. Apply to all affected areas twice daily as needed.  - triamcinolone (KENALOG) 0.025 % cream; Apply 1 application topically 2 (two) times daily.  Dispense: 240 g; Refill: 3  3. Chronic allergic rhinitis Continue zyrtec 74m daily.  - cetirizine (ZYRTEC) 10 MG tablet; Take 1 tablet (10 mg total) by mouth daily.  Dispense: 90 tablet; Refill: 3  4. Uncontrolled type 1 diabetes mellitus with hyperglycemia ([facility] Patient should continue to see endocrinology as scheduled.   5. Autistic disorder, active Regular visits with neurology as scheduled.     General Counseling: ADestenverbalizes understanding of the findings of todays visit and agrees with plan of treatment. I have discussed any further diagnostic evaluation that may be needed or ordered today. We also reviewed his medications today. he has been encouraged to call the office with any questions or concerns that should arise related to todays visit.    Counseling:   This patient was seen by HLeretha PolFNP Collaboration with Dr FLavera Guiseas a part of collaborative care agreement  Meds ordered this encounter  Medications   triamcinolone (KENALOG) 0.025 % cream    Sig: Apply 1 application topically 2 (two) times daily.    Dispense:  240 g    Refill:  3    Order Specific Question:   Supervising Provider    Answer:   KLavera Guise[[id number]]    cetirizine (ZYRTEC) 10 MG tablet    Sig: Take 1 tablet (10 mg total) by mouth daily.    Dispense:  90 tablet    Refill:  3    Order Specific Question:   Supervising Provider    Answer:   KLavera Guise[[[id number]   Total time spent: 30 Minutes  Time spent includes review of chart, medications, test results, and follow up plan with the patient.     FLavera Guise MD  Internal Medicine

## 2020-01-20 ENCOUNTER — Encounter: Payer: Self-pay | Admitting: Nurse Practitioner

## 2020-01-20 DIAGNOSIS — R300 Dysuria: Secondary | ICD-10-CM | POA: Insufficient documentation

## 2020-01-20 DIAGNOSIS — Z0001 Encounter for general adult medical examination with abnormal findings: Secondary | ICD-10-CM | POA: Insufficient documentation

## 2020-01-20 DIAGNOSIS — L209 Atopic dermatitis, unspecified: Secondary | ICD-10-CM | POA: Insufficient documentation

## 2020-02-22 IMAGING — DX DG CHEST 1V PORT
1 series · 1 of 1 positions shown · non-contrast
Comparison: 10/15/2017.

CLINICAL DATA: Leukocytosis.

EXAM:
PORTABLE CHEST 1 VIEW

[chest ap]
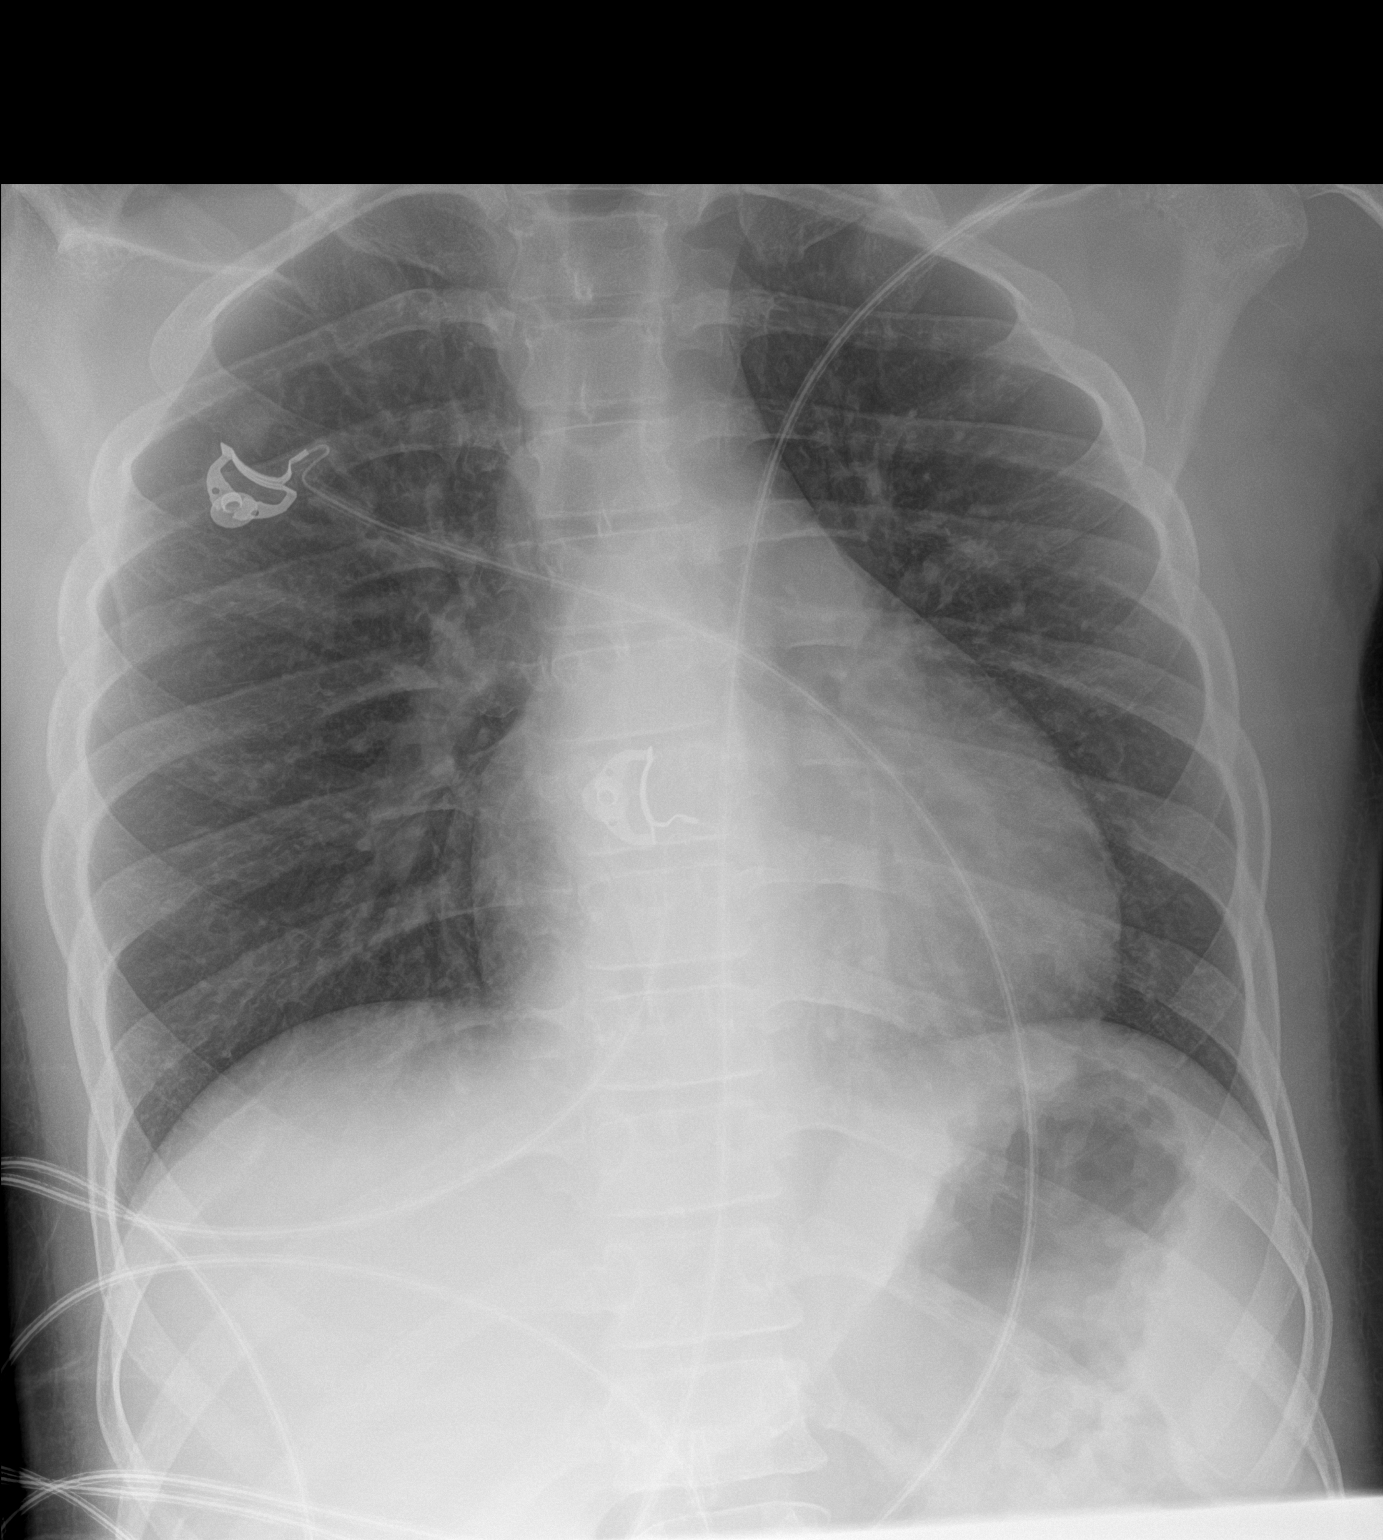

[1 of 1 positions shown; findings below may reference images not displayed]

FINDINGS: Interval enlargement of the cardiac silhouette. The lungs remain
clear with normal vascularity. Unremarkable bones.
IMPRESSION: Interval cardiomegaly, magnified by the portable AP technique. The
increase in size could be due to an interval pericardial effusion.
Otherwise, normal examination.

## 2020-02-23 IMAGING — DX DG CHEST 1V PORT
1 series · 1 of 1 positions shown · non-contrast
Comparison: Radiograph April 16, 2018.

CLINICAL DATA: Shortness of breath.

EXAM:
PORTABLE CHEST 1 VIEW

[chest ap]
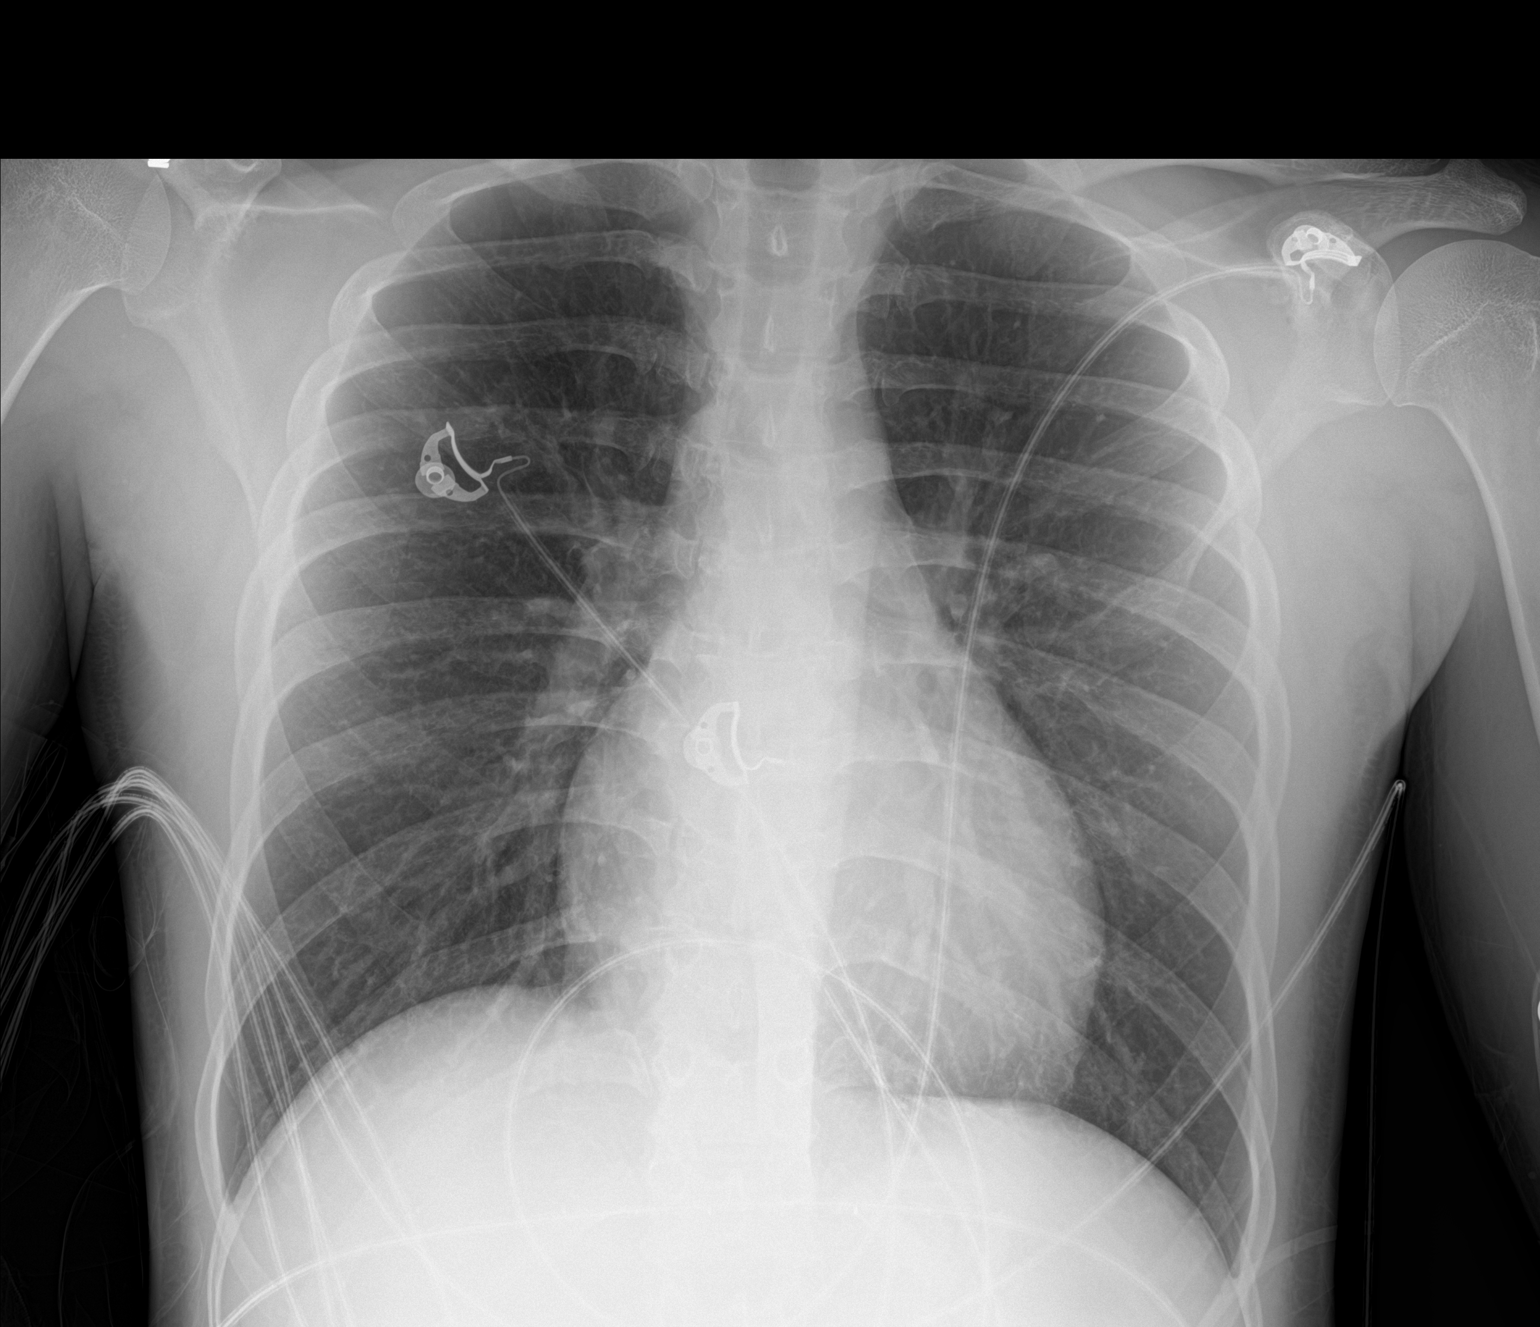

[1 of 1 positions shown; findings below may reference images not displayed]

FINDINGS: The heart size and mediastinal contours are within normal limits.
Both lungs are clear. No pneumothorax or pleural effusion is noted.
The visualized skeletal structures are unremarkable.
IMPRESSION: No acute cardiopulmonary abnormality seen.

## 2020-03-16 ENCOUNTER — Other Ambulatory Visit (INDEPENDENT_AMBULATORY_CARE_PROVIDER_SITE_OTHER): Payer: Self-pay | Admitting: "Endocrinology

## 2020-03-22 ENCOUNTER — Telehealth (INDEPENDENT_AMBULATORY_CARE_PROVIDER_SITE_OTHER): Payer: Medicaid Other | Admitting: "Endocrinology

## 2020-03-22 ENCOUNTER — Other Ambulatory Visit: Payer: Self-pay

## 2020-03-22 DIAGNOSIS — E1065 Type 1 diabetes mellitus with hyperglycemia: Secondary | ICD-10-CM | POA: Diagnosis not present

## 2020-03-22 DIAGNOSIS — E063 Autoimmune thyroiditis: Secondary | ICD-10-CM

## 2020-03-22 DIAGNOSIS — E10649 Type 1 diabetes mellitus with hypoglycemia without coma: Secondary | ICD-10-CM

## 2020-03-22 DIAGNOSIS — F840 Autistic disorder: Secondary | ICD-10-CM

## 2020-03-22 DIAGNOSIS — E049 Nontoxic goiter, unspecified: Secondary | ICD-10-CM | POA: Diagnosis not present

## 2020-03-22 DIAGNOSIS — E1042 Type 1 diabetes mellitus with diabetic polyneuropathy: Secondary | ICD-10-CM

## 2020-03-22 NOTE — Progress Notes (Signed)
Subjective:  Patient Name: Johnny [name] Date of Birth: October 21, 1995  MRN: [id number]  Jeff Frieden  presents at today's televisit for follow-up evaluation and management  of his T1DM, hypoglycemia, autism, ADHD, possible mental retardation, goiter, thyroiditis, adjustment reaction, and family history of autoimmune thyroid disease.  HISTORY OF PRESENT ILLNESS:   Johnny [name] is a 24 y.o. Caucasian young man.   Johnny [name] was accompanied by his mother.  68. Johnny [name] was seen at [facility] in [place], [place], on 06/26/13 for complaints of weight loss, fatigue, personality change, polyuria, polydipsia, and vomiting. New-onset DM and DKA were diagnosed. He received fluids at the ED at [facility], was transferred to [facility], and was admitted to the PICU for evaluation and management of new-onset T1DM, DKA, dehydration, and ketonuria in the setting of pre-existing autism, ADHD, and possible mental retardation. Significantly abnormal lab results included: Venous pH 7.257, serum potassium 3.0, serum CO2 15, serum glucose 257, hemoglobin A1c 13.7%, C-peptide 0.19 (normal 0.80-3.09), anti-glutamic acid decarboxylase (GAD) antibody > 30 (normal < 1.0), anti-islet cell antibody 80 (normal < 5). TSH was 2.163, free T4 0.82, free T3 1.8 (normal 2.3-4.2). He was treated initially with infusions of insulin and fluids. When his DKA resolved, he was converted to a multiple daily injection (MDI) insulin plan, using Lantus as a basal insulin and Novolog aspart as a bolus insulin at mealtimes, bedtime, and 2 AM if needed. 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   2. Johnny [name] was discharged from [facility] on 07/04/13. He and his mother participated in our [place] education program with Ms. [name] Eaton, RN. 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 When he went into the honeymoon period his Lantus dose was decreased to 6 units at night.  Since then, however, he has exited the honeymoon period and has had a gradual but progressive increase in his exogenous insulin requirement. He started his Dexcom G5 CGM in early 2016. He was converted to an Omnipod pump on 01/16/16. He was subsequently converted to a Dexcom G6.  3. Johnny [name]'s last [facility] televisit was on 12/02/19. At that visit I continued his insulin pump settings.    A. In the interim he has been healthy. Everyone in the house just recovered from summer colds. His allergies continue to be better since moving to [place], [place]. His PCP prescribed some cream for his feet that is helping.  B. His BGs have been "up and down, but nothing out of the ordinary".  Johnny [name] has not had many low BGs during the nights, early morning hours, or later in the day. The family is now reducing his Novolog doses at meals by 50%. If his BGs are not good 1-2 hours, then the family gives more insulin. Most of his higher BGs occur after dinner. He has had a few BGs in the 400s.    C. His Omnipod pump has been working well overall. His Dexcom G6 has also been working well. Mom has continued to do FSBG testing using his Fastclix lancets. Mom has to reduce his food boluses quite frequently.   D. Johnny [name] has not had the covid vaccinations. Neither has mom, but they plan to obtain them soon. I encouraged both of them to get the vaccine.    E. His appetite is good. Mom still controls the number of carbs he takes in.   Johnny [name] still remains completely dependent on his mother for all of his  basic needs.    4. [name]:  Constitutional: Johnny [name] feels "good". He likes his new home.  Eyes: Vision seems to be good when he wears his glasses. He had his last eye appointment in May 2019. The eye doctor did not see any signs of DM damage.   Neck: Johnny [name] has not had any pains or swelling in the front part of his neck. There are no other recognized problems of the anterior neck.  Heart: There are no recognized  heart problems. The ability to perform physical activities seems normal for his limited  level of physical activity.  Gastrointestinal: He is still hungry. He no longer has postprandial bloating. Bowel movents seem normal. There are no recognized GI problems. Legs: Muscle mass and strength seem normal. He can perform his usual ADLs and other physical activities without obvious discomfort. He has no numbness, tingling, burning, or pains. No edema is noted.  Feet: as above. The skin on his feet has improved with the new medication prescribed by his new PCP. There are no other obvious foot problems. No edema is noted.  Neurologic: There are no recognized problems with muscle movement and strength, sensation, or coordination. Hypoglycemia: Mom says that he has had fewer low BGs.    5. BG printout: No data available.   6. CGM printout: No data available  PAST MEDICAL, FAMILY, AND SOCIAL HISTORY    Past Medical History:  Diagnosis Date  . ADHD (attention deficit hyperactivity disorder)   . Allergic rhinitis   . Autism   . Diabetes mellitus without complication ([place])     Family History  Problem Relation Age of Onset  . Hypothyroidism Mother   . Kidney disease Mother        Mom unsure what her kidney issues are  . Autism Sister   . Mental retardation Sister   . Cancer Maternal Grandfather   . Diabetes Paternal Grandmother        Died at age 33.  . Other Other        Paternal great aunt with "hypoglycemia" unsure why     Current Outpatient Medications:  .  Accu-Chek FastClix Lancets MISC, CHECK BLOOD SUGAR 6 TIMES DAILY - E11.9, Disp: 204 each, Rfl: 6 .  cetirizine (ZYRTEC) 10 MG tablet, Take 1 tablet (10 mg total) by mouth daily., Disp: 90 tablet, Rfl: 3 .  Continuous Blood Gluc Receiver ([place]) DEVI, 1 kit by Does not apply route as needed., Disp: 1 Device, Rfl: 0 .  Continuous Blood Gluc Sensor (DEXCOM G6 SENSOR) MISC, USE AS DIRECTED CHANGE EVERY 10 DAYS, Disp: 3 each,  Rfl: 5 .  Continuous Blood Gluc Transmit (DEXCOM G6 TRANSMITTER) MISC, 1 kit by Does not apply route as needed., Disp: 1 each, Rfl: 5 .  diphenhydrAMINE (BENADRYL) 25 MG tablet, Take 50 mg by mouth every 6 (six) hours as needed for itching or allergies., Disp: , Rfl:  .  glucagon (GLUCAGEN HYPOKIT) 1 MG SOLR injection, INJECT 1 MG IN THE MUSCLE FOR EXTREME HYPOGLYCEMIA, Disp: 2 each, Rfl: 5 .  insulin aspart (NOVOLOG FLEXPEN) 100 UNIT/ML FlexPen, Use up to 50 units daily in the event pump fails, Disp: 15 mL, Rfl: 5 .  insulin aspart (NOVOLOG) 100 UNIT/ML injection, ADMINISTER 200 UNITS VIA INSULIN PUMP EVERY 48 HOURS, Disp: 40 mL, Rfl: 5 .  LANTUS SOLOSTAR 100 UNIT/ML Solostar Pen, USE UP TO 50 units DAILY in THE event pump fails, Disp: 15 mL, Rfl: 5 .  triamcinolone (KENALOG) 0.025 % cream, Apply 1 application topically 2 (two) times daily., Disp: 240 g, Rfl: 3 .  Urine Glucose-Ketones Test STRP, If urine output and extreme thirst increases, collect urine and check with strip as directed. Disp 1 vial, Disp: 20 each, Rfl: 3  Allergies as of 03/22/2020  . (No Known Allergies)     reports that he has never smoked. He has never used smokeless tobacco. He reports that he does not drink alcohol. Pediatric History  Patient Parents  . Johnny [name] (Mother)   Other Topics Concern  . Not on file  Social History Narrative   Lives with Mom and Sister (17y).  Mom quit smoking 18 years ago.  She has 2 other grown children.  They have a dog at home.  No recent travel.   1. School and Family: He completed his education program. He has health insurance through [facility]. Mom has health insurance as well. Family moved to [place] on 05/24/19. Mom wants me to continue to see [facility], but we also discussed transferring his care to an adult endocrinologist in the [place] area, such as [facility].  2. Activities: His play and video games.  3. Primary Care Provider: His new PCP is a NP named Leretha Pol, at the [facility] in [place].  4. Psychiatrist: None at present  REVIEW OF SYSTEMS: There are no other significant problems involving Johnny [name]'s other body systems.   Objective:   There were no vitals taken for this visit.  Growth percentile SmartLinks can only be used for patients less than 51 years old.  Wt Readings from Last 3 Encounters:  01/12/20 159 lb 9.6 oz (72.4 kg)  07/14/19 157 lb (71.2 kg)  04/14/19 161 lb 6.4 oz (73.2 kg)    Ht Readings from Last 3 Encounters:  01/12/20 _0  (1.676 m)  07/14/19 _1  (1.676 m)  04/14/19 5' 6.42" (1.687 m)    HC Readings from Last 3 Encounters:  No data found for [facility]   Facility age limit for growth percentiles is 20 years.  There is no height or weight on file to calculate BMI. No height and weight on file for this encounter.  There is no height or weight on file to calculate BSA.   LAB DATA:  No results found for this or any previous visit (from the past 504 hour(s)).   Labs 04/14/19: HbA1c 8.2%, CBG 307  Labs 03/31/19: TSH 1.20, free T4 1.1, free T3 3.6; CMP normal; cholesterol 111, triglycerides 94, HDL 44, LDL 94;  microalbumin/creatinine ratio 3  Labs 08/07/18: HbA1c 8.8%, CBG 204  Labs 05/07/18: HbA1c 9.0%, CBG 171  Labs 04/16/18: TSH 2.081  Labs 04/15/18: vpH 7.07; serum CO2 9, creatinine 1.49; Urine glucose >500 and ketones 80  Labs 02/07/18: HbA1c 10.0%  Labs 02/05/18: CBG 251  Labs 12/03/17: CBG 334  Labs 09/30/17: HbA1c 8.7%, CBG 254  Labs 07/24/17: CBG 263; TSH 2.18, free T4 1.1, free T3 3.3; urine microalbumin/creatinine ration 2; CMP normal except glucose 263  Labs 05/07/17: HbA1c 9.3%, CBG 246  Labs 03/04/17: CBG 494. He had not bolused after breakfast due to mom putting in a new pod just before they left home to drive to his appointment today.  Labs 12/26/16: HbA1c 10.2%, CBG 289  Labs 09/25/16: HbA1c 9.5%, CBG 261  Labs 06/27/16: CBG 114; TSH 1.54, free T4 1.1, free T3 3.4; CMP  normal; urine microalbumin/creatinine ratio 2; cholesterol 108, triglycerides 153, HDL 43, LDL 34  Labs 04/10/16: HbA1c 9.5%, CBG 145  Labs 02/01/16: CBG 391, TSH 1.72  Labs 12/21/15: HbA1c 8.4%  Labs 10/10/15: HbA1c 8.2%.  Labs 08/09/15: HbA1c 8.5%  Labs 1012/16: HbA1c 8.8%; TSH 2.624, free T4 0.99, free T3 3.4; urine microalbumin/creatinine ratio <0.2; CMP normal  Labs 01/27/15: HbA1c 8.8%  Labs 10/25/14: Hemoglobin A1c 8.2%, compared with 7.9% at the last visit and with 8.4% at the prior visit.    Labs 10/22/13: CMP normal; C-peptide 0.65, increased from 0.19 at diagnosis (normal 0.80-3.90); TSH 1.265, free T4 1.01, free T3 3.4, TPO antibody 10.4    Assessment and Plan:   ASSESSMENT:  1-3. T1DM/hypoglycemia/hypoglycemia unawareness  A. Since exiting the honeymoon period, Johnny [name] has required more exogenous insulin than he needed before. His BGs have been much more variable over time.   B. His current basal rate settings and bolus settings have helped to control his BGs. He still sometimes may have inaccurate carb counts at certain meals.   C. Since his last visit, however, mother has recognized that if he takes the boluses recommended by the pump, his BGs will be low 2-3 hours after meals. As a result, she has been reducing his insulin boluses by about 50%, then giving him additional smaller boluses 1-2 hours later if needed. The number of low BGs has decreased markedly.   D. Ms. Drema Dallas is doing a very good job overall of taking care of Kayman.     4-5. Goiter/thyroiditis:   A. He has the personal history of autoimmune T1DM. He also has the family history of autoimmune thyroid disease.  B. His pattern of waxing and waning of thyroid gland size and thyroid lobe sizes is c/w evolving Hashimoto's thyroiditis. It is very likely that he will become hypothyroid in the future.   C.  He was euthyroid in April 2015, in October 2016, in January 2019, in September 2019, and again in September 2020.  He is clinically euthyroid now.   6. Adjustment reaction: Things are going pretty well overall. Mom is trying very hard to take good care of Johnny [name].  7. Autism/ADHD/ mental retardation: Johnny [name]'s autism has improved somewhat in that he is more social and outgoing. However, he remains completely dependent upon mom for his meals, shelter, clothing, recreational activities, physical activities, and T1DM care. 8. Peripheral neuropathy: His neuropathy was not evident at his last clinic visit, in December 2020, and again in May 2021. He says he is not having any symptoms today.   PLAN:  1. Diagnostic: Obtain the Dexcom readings on Friday.  2. Therapeutic: Continue Small bedtime snack.   Continue basal rates: MN: 1.35 4 AM: 1.50 8 AM: 1.15 Noon: 0.95 6 PM: 1.25  Continue ISF: 50  Continue BG targets: MN: 180 6 AM: 150 9 PM: 180  Change ICRs: MN: 15  6 AM:12 -> 24 Noon: 15 -> 30  3. Patient education: We discussed his elevated BGs, his hypoglycemia, and all of the above at length. I again asked mom to call me if Johnny [name] has any BGs during the night or in the mornings that are <80 and unexplained. I also asked her to keep a log of unusual high BGs or lower BGs and the causes. We discussed possibly seeking out an adult endocrinologist for [place] in [place]. For the present, however, mom wants to bring Rashod back to see me.   4. Follow-up: three months for an afternoon appointment.  Level of Service: This visit lasted in excess of 45 minutes. More than  50% of the visit was devoted to counseling.   Tillman Sers, MD, CDE Adult and Pediatric Endocrinology  This is a Pediatric Specialist E-Visit follow up consult provided via Telephone. Johnny [name] and his mother, Ms. Johnny [name] consented to an E-Visit consult today.  Location of patient: Gilles and Ms Drema Dallas are at their home.  Location of provider: Tillman Sers, MD is at his office. Patient was referred by Ronnell Freshwater,  NP   The following participants were involved in this E-Visit: Mitzi Hansen, Ms. Drema Dallas, and Dr. Tobe Sos  Chief Complain/ Reason for E-Visit today: T1DM, hypoglycemia, peripheral neuropathy, goiter, thyroiditis, autism  Total time on call: 30 minutes Follow up: 3 months

## 2020-03-22 NOTE — Patient Instructions (Signed)
Follow up visit in 3 months. 

## 2020-03-24 ENCOUNTER — Other Ambulatory Visit (INDEPENDENT_AMBULATORY_CARE_PROVIDER_SITE_OTHER): Payer: Self-pay | Admitting: Family

## 2020-03-24 DIAGNOSIS — IMO0002 Reserved for concepts with insufficient information to code with codable children: Secondary | ICD-10-CM

## 2020-03-25 ENCOUNTER — Telehealth: Payer: Self-pay | Admitting: "Endocrinology

## 2020-03-25 NOTE — Telephone Encounter (Signed)
1. Mother wanted to send in Johnny [name]'s Dexcom readings today, but we are not able to accept them. 2. I asked her to call me tonight or tomorrow evening between 8:00-9:30 PM to discuss BGs.  [name], MD, CDE

## 2020-03-29 ENCOUNTER — Other Ambulatory Visit (INDEPENDENT_AMBULATORY_CARE_PROVIDER_SITE_OTHER): Payer: Self-pay | Admitting: "Endocrinology

## 2020-03-29 NOTE — Telephone Encounter (Signed)
[employer] Call ID: [id number]

## 2020-04-08 ENCOUNTER — Other Ambulatory Visit: Payer: Self-pay

## 2020-05-01 ENCOUNTER — Other Ambulatory Visit (INDEPENDENT_AMBULATORY_CARE_PROVIDER_SITE_OTHER): Payer: Self-pay | Admitting: "Endocrinology

## 2020-06-06 ENCOUNTER — Other Ambulatory Visit: Payer: Self-pay

## 2020-06-06 ENCOUNTER — Encounter: Payer: Self-pay | Admitting: Nurse Practitioner

## 2020-06-06 ENCOUNTER — Ambulatory Visit: Payer: Medicaid Other | Admitting: Nurse Practitioner

## 2020-06-06 VITALS — BP 109/65 | HR 78 | Temp 98.5°F | Resp 16 | Ht 66.0 in | Wt 160.8 lb

## 2020-06-06 DIAGNOSIS — F840 Autistic disorder: Secondary | ICD-10-CM

## 2020-06-06 DIAGNOSIS — E1065 Type 1 diabetes mellitus with hyperglycemia: Secondary | ICD-10-CM | POA: Diagnosis not present

## 2020-06-06 DIAGNOSIS — L209 Atopic dermatitis, unspecified: Secondary | ICD-10-CM

## 2020-06-06 DIAGNOSIS — Z23 Encounter for immunization: Secondary | ICD-10-CM | POA: Diagnosis not present

## 2020-06-06 LAB — POCT GLYCOSYLATED HEMOGLOBIN (HGB A1C): Hemoglobin A1C: 9.2 % — AB (ref 4.0–5.6)

## 2020-06-06 MED ORDER — TRIAMCINOLONE ACETONIDE 0.025 % EX CREA: 1.0000 "application " | TOPICAL_CREAM | Freq: Two times a day (BID) | CUTANEOUS | 3 refills | Status: AC

## 2020-06-06 NOTE — Progress Notes (Signed)
[facility] [place], [place] [address]  Internal MEDICINE  Office Visit Note  Patient Name: Johnny [name]  [id number]  [id number]  Date of Service: 07/03/2020  Chief Complaint  Patient presents with  . Follow-up  . Diabetes  . Quality Metric Gaps    flu tetnaus,covid, foot  . Foot Problem    still really dry    The patient is here for routine follow up.  -type 1 diabetes and sees endocrinologist but has been unable to go in some time. HgbA1c 9.2 today. Will need to see his endocrinology as soon as possible to adjust dosing on insulin pump.  -needs to have flu shot.  -dermatitis type rash on the arches of his feet. Itchy and inflamed.       Current Medication: Outpatient Encounter Medications as of 06/06/2020  Medication Sig Note  . Accu-Chek FastClix Lancets MISC CHECK BLOOD SUGAR 6 TIMES DAILY - E11.9 (Patient not taking: Reported on 06/29/2020)   . cetirizine (ZYRTEC) 10 MG tablet Take 1 tablet (10 mg total) by mouth daily. (Patient not taking: Reported on 06/29/2020)   . Continuous Blood Gluc Receiver (DEXCOM G6 RECEIVER) DEVI 1 kit by Does not apply route as needed. (Patient not taking: Reported on 06/29/2020)   . Continuous Blood Gluc Sensor (DEXCOM G6 SENSOR) MISC USE AS DIRECTED CHANGE EVERY 10 DAYS (Patient not taking: Reported on 06/29/2020)   . Continuous Blood Gluc Transmit (DEXCOM G6 TRANSMITTER) MISC USE AS DIRECTED CHANGE EVERY 90 DAYS (Patient not taking: Reported on 06/29/2020)   . diphenhydrAMINE (BENADRYL) 25 MG tablet Take 50 mg by mouth every 6 (six) hours as needed for itching or allergies.  12/02/2019: PRN for allergies  . glucagon (GLUCAGEN HYPOKIT) 1 MG SOLR injection INJECT 1 MG IN THE MUSCLE FOR EXTREME HYPOGLYCEMIA (Patient not taking: Reported on 06/29/2020) 12/02/2019: PRN  . insulin aspart (NOVOLOG) 100 UNIT/ML injection ADMINISTER 200 UNITS VIA INSULIN PUMP EVERY 48 HOURS   . LANTUS SOLOSTAR 100 UNIT/ML Solostar Pen USE UP TO 50  units DAILY in THE event pump fails (Patient not taking: Reported on 06/29/2020)   . NOVOLOG FLEXPEN 100 UNIT/ML FlexPen USE UP TO 50 units DAILY in THE event pump fails (Patient not taking: Reported on 06/29/2020)   . triamcinolone (KENALOG) 0.025 % cream Apply 1 application topically 2 (two) times daily. (Patient not taking: Reported on 06/29/2020)   . Urine Glucose-Ketones Test STRP If urine output and extreme thirst increases, collect urine and check with strip as directed. Disp 1 vial (Patient not taking: Reported on 06/29/2020)   . [DISCONTINUED] triamcinolone (KENALOG) 0.025 % cream Apply 1 application topically 2 (two) times daily.    No facility-administered encounter medications on file as of 06/06/2020.    Surgical History: Past Surgical History:  Procedure Laterality Date  . DENTAL REHABILITATION      Medical History: Past Medical History:  Diagnosis Date  . ADHD (attention deficit hyperactivity disorder)   . Allergic rhinitis   . Autism   . Diabetes mellitus without complication ([place])     Family History: Family History  Problem Relation Age of Onset  . Hypothyroidism Mother   . Kidney disease Mother        Mom unsure what her kidney issues are  . Autism Sister   . Mental retardation Sister   . Cancer Maternal Grandfather   . Diabetes Paternal Grandmother        Died at age 49.  . Other Other  Paternal great aunt with "hypoglycemia" unsure why    Social History   Socioeconomic History  . Marital status: Single    Spouse name: Not on file  . Number of children: Not on file  . Years of education: Not on file  . Highest education level: Not on file  Occupational History  . Not on file  Tobacco Use  . Smoking status: Never Smoker  . Smokeless tobacco: Never Used  Vaping Use  . Vaping Use: Never used  Substance and Sexual Activity  . Alcohol use: No  . Drug use: Not on file  . Sexual activity: Not on file  Other Topics Concern  . Not on file   Social History Narrative   Lives with Mom and Sister (17y).  Mom quit smoking 18 years ago.  She has 2 other grown children.  They have a dog at home.  No recent travel.   Social Determinants of Health   Financial Resource Strain: Not on file  Food Insecurity: Not on file  Transportation Needs: Not on file  Physical Activity: Not on file  Stress: Not on file  Social Connections: Not on file  Intimate Partner Violence: Not on file      Review of Systems  Constitutional: Negative for activity change, chills, fatigue and unexpected weight change.  HENT: Negative for congestion, postnasal drip, rhinorrhea, sneezing and sore throat.   Respiratory: Negative for cough, chest tightness and shortness of breath.   Cardiovascular: Negative for chest pain and palpitations.  Gastrointestinal: Negative for abdominal pain, constipation, diarrhea, nausea and vomiting.  Endocrine: Negative for cold intolerance, heat intolerance, polydipsia and polyuria.       Patient on insulin pump due to type 1 diabetes. Followed by endocrinology. Has not been in some time.  HgbA1c 9.2 today.   Musculoskeletal: Negative for arthralgias, back pain, joint swelling and neck pain.  Skin: Negative for rash.       Dry skin with redness and itchiness along the tops of both feet. Skin feels rough.   Allergic/Immunologic: Positive for environmental allergies and food allergies.  Neurological: Negative for dizziness, tremors, numbness and headaches.  Hematological: Negative for adenopathy. Does not bruise/bleed easily.  Psychiatric/Behavioral: Negative for behavioral problems (Depression), sleep disturbance and suicidal ideas. The patient is nervous/anxious.     Today's Vitals   06/06/20 1354  BP: 109/65  Pulse: 78  Resp: 16  Temp: 98.5 F (36.9 C)  SpO2: 96%  Weight: 160 lb 12.8 oz (72.9 kg)  Height: '5\' 6"'  (1.676 m)   Body mass index is 25.95 kg/m.  Physical Exam Vitals and nursing note reviewed.   Constitutional:      General: He is not in acute distress.    Appearance: Normal appearance. He is well-developed. He is not diaphoretic.  HENT:     Head: Normocephalic and atraumatic.     Mouth/Throat:     Pharynx: No oropharyngeal exudate.  Eyes:     Pupils: Pupils are equal, round, and reactive to light.  Neck:     Thyroid: No thyromegaly.     Vascular: No JVD.     Trachea: No tracheal deviation.  Cardiovascular:     Rate and Rhythm: Normal rate and regular rhythm.     Heart sounds: Normal heart sounds. No murmur heard. No friction rub. No gallop.   Pulmonary:     Effort: Pulmonary effort is normal. No respiratory distress.     Breath sounds: Normal breath sounds. No wheezing or rales.  Chest:     Chest wall: No tenderness.  Abdominal:     Palpations: Abdomen is soft.  Musculoskeletal:        General: Normal range of motion.     Cervical back: Normal range of motion and neck supple.  Lymphadenopathy:     Cervical: No cervical adenopathy.  Skin:    General: Skin is warm and dry.     Comments: Very rough and dry skin along the dorsal aspect of both feet. Skin red with no focal lesions.   Neurological:     Mental Status: He is alert and oriented to person, place, and time. Mental status is at baseline.     Cranial Nerves: No cranial nerve deficit.     Comments: Patient is at his neurological baseline.   Psychiatric:        Mood and Affect: Mood is anxious.        Behavior: Behavior normal.        Thought Content: Thought content normal.        Judgment: Judgment normal.    Assessment/Plan: 1. Uncontrolled type 1 diabetes mellitus with hyperglycemia (HCC) - POCT HgB A1C 9.2 today. Patient should see endocrinology as soon as possible to adjust dosing inf insulin pump.   2. Atopic dermatitis, unspecified type May use triamcinolone cream twice daily as needed  - triamcinolone (KENALOG) 0.025 % cream; Apply 1 application topically 2 (two) times daily. (Patient not  taking: Reported on 06/29/2020)  Dispense: 240 g; Refill: 3  3. Autistic disorder, active Stable.   4. Needs flu shot Flu vaccine administered today.  - Flu Vaccine MDCK QUAD PF  General Counseling: Rutger verbalizes understanding of the findings of todays visit and agrees with plan of treatment. I have discussed any further diagnostic evaluation that may be needed or ordered today. We also reviewed his medications today. he has been encouraged to call the office with any questions or concerns that should arise related to todays visit.  Diabetes Counseling:  1. Addition of ACE inh/ ARB'S for nephroprotection. Microalbumin is updated  2. Diabetic foot care, prevention of complications. Podiatry consult 3. Exercise and lose weight.  4. Diabetic eye examination, Diabetic eye exam is updated  5. Monitor blood sugar closlely. nutrition counseling.  6. Sign and symptoms of hypoglycemia including shaking sweating,confusion and headaches.   This patient was seen by Leretha Pol FNP Collaboration with Dr Lavera Guise as a part of collaborative care agreement  Orders Placed This Encounter  Procedures  . Flu Vaccine MDCK QUAD PF  . POCT HgB A1C    Meds ordered this encounter  Medications  . triamcinolone (KENALOG) 0.025 % cream    Sig: Apply 1 application topically 2 (two) times daily.    Dispense:  240 g    Refill:  3    Order Specific Question:   Supervising Provider    Answer:   Lavera Guise [[id number]]    Total time spent: 30 Minutes   Time spent includes review of chart, medications, test results, and follow up plan with the patient.      Dr Lavera Guise Internal medicine

## 2020-06-29 ENCOUNTER — Ambulatory Visit (INDEPENDENT_AMBULATORY_CARE_PROVIDER_SITE_OTHER): Payer: Medicaid Other | Admitting: "Endocrinology

## 2020-06-29 ENCOUNTER — Encounter (INDEPENDENT_AMBULATORY_CARE_PROVIDER_SITE_OTHER): Payer: Self-pay | Admitting: "Endocrinology

## 2020-06-29 ENCOUNTER — Other Ambulatory Visit: Payer: Self-pay

## 2020-06-29 VITALS — BP 110/70 | HR 80 | Ht 67.32 in | Wt 161.0 lb

## 2020-06-29 DIAGNOSIS — E063 Autoimmune thyroiditis: Secondary | ICD-10-CM

## 2020-06-29 DIAGNOSIS — E1065 Type 1 diabetes mellitus with hyperglycemia: Secondary | ICD-10-CM | POA: Diagnosis not present

## 2020-06-29 DIAGNOSIS — E1042 Type 1 diabetes mellitus with diabetic polyneuropathy: Secondary | ICD-10-CM

## 2020-06-29 DIAGNOSIS — F79 Unspecified intellectual disabilities: Secondary | ICD-10-CM

## 2020-06-29 DIAGNOSIS — E049 Nontoxic goiter, unspecified: Secondary | ICD-10-CM

## 2020-06-29 DIAGNOSIS — F840 Autistic disorder: Secondary | ICD-10-CM

## 2020-06-29 DIAGNOSIS — E10649 Type 1 diabetes mellitus with hypoglycemia without coma: Secondary | ICD-10-CM | POA: Diagnosis not present

## 2020-06-29 LAB — POCT GLUCOSE (DEVICE FOR HOME USE): POC Glucose: 220 mg/dl — AB (ref 70–99)

## 2020-06-29 NOTE — Progress Notes (Signed)
Subjective:  Patient Name: Johnny [name] Date of Birth: February 27, 1996  MRN: [id number]  Admiral Marcucci  presents at today's clinic visit for follow-up evaluation and management  of his T1DM, hypoglycemia, autism, ADHD, possible mental retardation, goiter, thyroiditis, adjustment reaction, and family history of autoimmune thyroid disease.  HISTORY OF PRESENT ILLNESS:   Johnny [name] is a 24 y.o. Caucasian young man.   [name] was accompanied by his mother.  42. [name] was seen at [facility] in [place], [place], on 06/26/13 for complaints of weight loss, fatigue, personality change, polyuria, polydipsia, and vomiting. New-onset DM and DKA were diagnosed. He received fluids at the ED at [facility], was transferred to [facility], and was admitted to the PICU for evaluation and management of new-onset T1DM, DKA, dehydration, and ketonuria in the setting of pre-existing autism, ADHD, and possible mental retardation. Significantly abnormal lab results included: Venous pH 7.257, serum potassium 3.0, serum CO2 15, serum glucose 257, hemoglobin A1c 13.7%, C-peptide 0.19 (normal 0.80-3.09), anti-glutamic acid decarboxylase (GAD) antibody > 30 (normal < 1.0), anti-islet cell antibody 80 (normal < 5). TSH was 2.163, free T4 0.82, free T3 1.8 (normal 2.3-4.2). He was treated initially with infusions of insulin and fluids. When his DKA resolved, he was converted to a multiple daily injection (MDI) insulin plan, using Lantus as a basal insulin and Novolog aspart as a bolus insulin at mealtimes, bedtime, and 2 AM if needed. 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   2. Johnny [name] was discharged from [facility] on 07/04/13. He and his mother participated in our [place] education program with Ms. [name] Eaton, RN. 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 When he went into the honeymoon period his Lantus dose was decreased to 6 units at night.  Since then, however, he has exited the honeymoon period and has had a gradual but progressive increase in his exogenous insulin requirement. He started his Dexcom G5 CGM in early 2016. He was converted to an Omnipod pump on 01/16/16. He was subsequently converted to a Dexcom G6.  3. Johnny [name]'s last [facility] televisit was on 03/22/20. At that visit I decreased his ICRs from 6 AM to midnight. Those changes helped  A. In the interim he has been healthy, but has a fine, red rash on his back and arms. His allergies continue to be better since moving to [place], [place].   B. His BGs have been "up and down, but nothing out of the ordinary".  [name] has not had as many low BGs.   C. His Omnipod pump has been working well overall. His Dexcom G6 has also been working well. Mom has continued to do FSBG testing using his Fastclix lancets. Mom sometimes has to reduce his food boluses.   Dion Body has not had the covid vaccinations. Neither has mom.. I encouraged both of them to get the vaccine.    E. His appetite is good. Mom still controls the number of carbs he takes in.   [name] still remains completely dependent on his mother for all of his basic needs.    4. [name]:  Constitutional: Johnny [name] feels "good".   Eyes: Vision seems to be good when he wears his glasses. He had his last eye appointment in May 2019. The eye doctor did not see any signs of DM damage.   Neck: Johnny [name] has not had any pains or swelling in the front part of his  neck. There are no other recognized problems of the anterior neck.  Heart: There are no recognized heart problems. The ability to perform physical activities seems normal for his limited  level of physical activity.  Gastrointestinal: He is still hungry. He no longer has postprandial bloating. Bowel movents seem normal. There are no recognized GI problems. Hands: No problems Legs: Muscle mass and strength seem normal. He can perform his usual ADLs and other physical  activities without obvious discomfort. He has no numbness, tingling, burning, or pains. No edema is noted.  Feet: There are no obvious foot problems. No edema is noted.  Neurologic: There are no recognized problems with muscle movement and strength, sensation, or coordination. Hypoglycemia: Mom says that he has had fewer low BGs.   Skin: His skin is dry.  5. BG printout: We have data from the past 2 weeks. Average BG is 189, range 55-385. He checks BGs 10-12 times per day. Mom has been giving his boluses after meals.   6. CGM printout: We have data from the past 2 weeks. Average SG is 204. SGs average about 230 at midnight, 150 at breakfast, 270 at lunch, 200 at dinner, and 220 at bedtime. Most of his lower SGs occur between 7-10 AM. His highest SGs occur after lunch. He needs more bolus insulin at meals.   PAST MEDICAL, FAMILY, AND SOCIAL HISTORY    Past Medical History:  Diagnosis Date  . ADHD (attention deficit hyperactivity disorder)   . Allergic rhinitis   . Autism   . Diabetes mellitus without complication ([place])     Family History  Problem Relation Age of Onset  . Hypothyroidism Mother   . Kidney disease Mother        Mom unsure what her kidney issues are  . Autism Sister   . Mental retardation Sister   . Cancer Maternal Grandfather   . Diabetes Paternal Grandmother        Died at age 13.  . Other Other        Paternal great aunt with "hypoglycemia" unsure why     Current Outpatient Medications:  .  diphenhydrAMINE (BENADRYL) 25 MG tablet, Take 50 mg by mouth every 6 (six) hours as needed for itching or allergies. , Disp: , Rfl:  .  insulin aspart (NOVOLOG) 100 UNIT/ML injection, ADMINISTER 200 UNITS VIA INSULIN PUMP EVERY 48 HOURS, Disp: 40 mL, Rfl: 5 .  Accu-Chek FastClix Lancets MISC, CHECK BLOOD SUGAR 6 TIMES DAILY - E11.9 (Patient not taking: Reported on 06/29/2020), Disp: 204 each, Rfl: 6 .  cetirizine (ZYRTEC) 10 MG tablet, Take 1 tablet (10 mg total) by mouth  daily. (Patient not taking: Reported on 06/29/2020), Disp: 90 tablet, Rfl: 3 .  Continuous Blood Gluc Receiver ([place]) DEVI, 1 kit by Does not apply route as needed. (Patient not taking: Reported on 06/29/2020), Disp: 1 Device, Rfl: 0 .  Continuous Blood Gluc Sensor (DEXCOM G6 SENSOR) MISC, USE AS DIRECTED CHANGE EVERY 10 DAYS (Patient not taking: Reported on 06/29/2020), Disp: 3 each, Rfl: 5 .  Continuous Blood Gluc Transmit (DEXCOM G6 TRANSMITTER) MISC, USE AS DIRECTED CHANGE EVERY 90 DAYS (Patient not taking: Reported on 06/29/2020), Disp: 1 each, Rfl: 5 .  glucagon (GLUCAGEN HYPOKIT) 1 MG SOLR injection, INJECT 1 MG IN THE MUSCLE FOR EXTREME HYPOGLYCEMIA (Patient not taking: Reported on 06/29/2020), Disp: 2 each, Rfl: 5 .  LANTUS SOLOSTAR 100 UNIT/ML Solostar Pen, USE UP TO 50 units DAILY in THE event pump fails (  Patient not taking: Reported on 06/29/2020), Disp: 15 mL, Rfl: 5 .  NOVOLOG FLEXPEN 100 UNIT/ML FlexPen, USE UP TO 50 units DAILY in THE event pump fails (Patient not taking: Reported on 06/29/2020), Disp: 15 mL, Rfl: 5 .  triamcinolone (KENALOG) 0.025 % cream, Apply 1 application topically 2 (two) times daily. (Patient not taking: Reported on 06/29/2020), Disp: 240 g, Rfl: 3 .  Urine Glucose-Ketones Test STRP, If urine output and extreme thirst increases, collect urine and check with strip as directed. Disp 1 vial (Patient not taking: Reported on 06/29/2020), Disp: 20 each, Rfl: 3  Allergies as of 06/29/2020 - Review Complete 06/29/2020  Allergen Reaction Noted  . Other  03/22/2020     reports that he has never smoked. He has never used smokeless tobacco. He reports that he does not drink alcohol. Pediatric History  Patient Parents  . [name] (Mother)   Other Topics Concern  . Not on file  Social History Narrative   Lives with Mom and Sister (17y).  Mom quit smoking 18 years ago.  She has 2 other grown children.  They have a dog at home.  No recent travel.   1. School  and Family: He completed his education program. He has health insurance through [facility]. Mom has health insurance as well. Family moved to [place] on 05/24/19. Mom wants me to continue to see Steen, but we also discussed transferring his care to an adult endocrinologist in the [place] area, such as [facility].  2. Activities: His play and video games.  3. Primary Care Provider: His new PCP is a NP named Leretha Pol, at the [facility] in [place].  4. Psychiatrist: None at present  REVIEW OF SYSTEMS: There are no other significant problems involving Zeyad's other body systems.   Objective:   BP 110/70   Pulse 80   Ht 5' 7.32" (1.71 m)   Wt 161 lb (73 kg)   BMI 24.97 kg/m   Growth percentile SmartLinks can only be used for patients less than 27 years old.  Wt Readings from Last 3 Encounters:  06/29/20 161 lb (73 kg)  06/06/20 160 lb 12.8 oz (72.9 kg)  01/12/20 159 lb 9.6 oz (72.4 kg)    Ht Readings from Last 3 Encounters:  06/29/20 5' 7.32" (1.71 m)  06/06/20 '5\' 6"'  (1.676 m)  01/12/20 '5\' 6"'  (1.676 m)    HC Readings from Last 3 Encounters:  No data found for [facility]   Facility age limit for growth percentiles is 20 years.  Body mass index is 24.97 kg/m. Facility age limit for growth percentiles is 20 years.  Body surface area is 1.86 meters squared.   LAB DATA:  Results for orders placed or performed in visit on 06/29/20 (from the past 504 hour(s))  POCT Glucose (Device for Home Use)   Collection Time: 06/29/20  9:42 AM  Result Value Ref Range   Glucose Fasting, POC     POC Glucose 220 (A) 70 - 99 mg/dl   Constitutional:  [place] looks good today. He has gained 4 pounds in the past year. He is bright and alert, more mature and interactive, but still significantly cognitively disabled.  Eyes: There is no arcus or proptosis.  Mouth: The oropharynx appears normal. The tongue appears normal. There is normal oral moisture. There is no obvious  gingivitis. Neck: There are no bruits present. The thyroid gland appears enlarged. The thyroid gland is diffusely enlarged at approximately 22 grams in size.  The consistency of the thyroid gland is full. There is no thyroid tenderness to palpation. Lungs: The lungs are clear. Air movement is good. Heart: The heart rhythm and rate appear normal. Heart sounds S1 and S2 are normal. I do not appreciate any pathologic heart murmurs. Abdomen: The abdominal size is mildly enlarged. Bowel sounds are normal. The abdomen is soft and non-tender. There is no obviously palpable hepatomegaly, splenomegaly, or other masses.  Arms: Muscle mass appears appropriate for age.  Hands: There is no obvious tremor. Phalangeal and metacarpophalangeal joints appear normal. Palms are normal. Legs: Muscle mass appears appropriate for age. There is no edema.  Feet: There are no significant deformities. Dorsalis pedis pulses are normal 1+bilaterally.  Neurologic: Muscle strength is normal for age and gender  in both the upper and the lower extremities. Muscle tone appears normal. Sensation to touch is normal in the legs and feet. Skin: He as a very fine, punctate red rash on his back and arms  LABS  Labs 06/29/20: HbA1c 9.2%, CBG 220  Labs 04/14/19: HbA1c 8.2%, CBG 307  Labs 03/31/19: TSH 1.20, free T4 1.1, free T3 3.6; CMP normal; cholesterol 111, triglycerides 94, HDL 44, LDL 94;  microalbumin/creatinine ratio 3  Labs 08/07/18: HbA1c 8.8%, CBG 204  Labs 05/07/18: HbA1c 9.0%, CBG 171  Labs 04/16/18: TSH 2.081  Labs 04/15/18: venous pH 7.07; serum CO2 9, creatinine 1.49; Urine glucose >500 and ketones 80  Labs 02/07/18: HbA1c 10.0%  Labs 02/05/18: CBG 251  Labs 12/03/17: CBG 334  Labs 09/30/17: HbA1c 8.7%, CBG 254  Labs 07/24/17: CBG 263; TSH 2.18, free T4 1.1, free T3 3.3; urine microalbumin/creatinine ration 2; CMP normal except glucose 263  Labs 05/07/17: HbA1c 9.3%, CBG 246  Labs 03/04/17: CBG 494. He had not  bolused after breakfast due to mom putting in a new pod just before they left home to drive to his appointment today.  Labs 12/26/16: HbA1c 10.2%, CBG 289  Labs 09/25/16: HbA1c 9.5%, CBG 261  Labs 06/27/16: CBG 114; TSH 1.54, free T4 1.1, free T3 3.4; CMP normal; urine microalbumin/creatinine ratio 2; cholesterol 108, triglycerides 153, HDL 43, LDL 34  Labs 04/10/16: HbA1c 9.5%, CBG 145  Labs 02/01/16: CBG 391, TSH 1.72  Labs 12/21/15: HbA1c 8.4%  Labs 10/10/15: HbA1c 8.2%.  Labs 08/09/15: HbA1c 8.5%  Labs 1012/16: HbA1c 8.8%; TSH 2.624, free T4 0.99, free T3 3.4; urine microalbumin/creatinine ratio <0.2; CMP normal  Labs 01/27/15: HbA1c 8.8%  Labs 10/25/14: Hemoglobin A1c 8.2%, compared with 7.9% at the last visit and with 8.4% at the prior visit.    Labs 10/22/13: CMP normal; C-peptide 0.65, increased from 0.19 at diagnosis (normal 0.80-3.90); TSH 1.265, free T4 1.01, free T3 3.4, TPO antibody 10.4    Assessment and Plan:   ASSESSMENT:  1-3. T1DM/hypoglycemia/hypoglycemia unawareness  A. Since exiting the honeymoon period, Zavon has required more exogenous insulin than he needed before. His BGs have been much more variable over time.   B. His current basal rate settings and bolus settings have helped to control his BGs. He still sometimes may have inaccurate carb counts at certain meals.   C. Since his last visit, however, mother has still been reducing his boluses at meals if she thinks he might become hypoglycemia. She is also waiting until after the meals to give his boluses. It may also be true that I reduced his ICRs too much at his last visit.    D. He has not had many low BGs.  E. Ms. Drema Dallas is doing a very good job overall of taking care of Jaicob.     4-5. Goiter/thyroiditis:   A. He has the personal history of autoimmune T1DM. He also has the family history of autoimmune thyroid disease.  B. His thyroid gland is more enlarged today. His pattern of waxing and waning of  thyroid gland size and thyroid lobe sizes is c/w evolving Hashimoto's thyroiditis. It is very likely that he will become hypothyroid in the future.   C.  He was euthyroid in April 2015, in October 2016, in January 2019, in September 2019, and again in September 2020. He is clinically euthyroid now.   6. Adjustment reaction: Things are going pretty well overall. Mom is trying very hard to take good care of Orian.  7. Autism/ADHD/ mental retardation: Deangleo's autism has improved somewhat in that he is more social and outgoing. However, he remains completely dependent upon mom for his meals, shelter, clothing, recreational activities, physical activities, and T1DM care. 8. Peripheral neuropathy: His neuropathy was not evident at his last clinic visit, in December 2020, in May 2021, or today in December 2021. He says he is not having any symptoms today.   PLAN:  1. Diagnostic: We reviewed his BG and Dexcom readings. Annual surveillance labs today.  2. Therapeutic: Continue Small bedtime snack. Give a correction bolus before meals and a food bolus after meals.  Continue basal rates: MN: 1.35 4 AM: 1.50 8 AM: 1.15 Noon: 0.95 6 PM: 1.25  Continue ISF: 50  Continue BG targets: MN: 180 6 AM: 150 9 PM: 180  Change ICRs: MN: 15  6 AM: 24 -> 18 Noon: 30 -> 22  3. Patient education: We discussed his elevated BGs, his hypoglycemia, and all of the above at length. I again asked mom to call me if Zayvion has any BGs during the night or in the mornings that are <80 and unexplained. I also asked her to keep a log of unusual high BGs or lower BGs and the causes. We discussed possibly seeking out an adult endocrinologist for [facility] in [place]. For the present, however, mom wants to bring Marck back to see me.   4. Follow-up: three months for an afternoon appointment.  Level of Service: This visit lasted in excess of 45 minutes. More than 50% of the visit was devoted to counseling.   Tillman Sers, MD, CDE Adult and Pediatric Endocrinology

## 2020-06-29 NOTE — Patient Instructions (Signed)
Follow up visit in 3 months. 

## 2020-06-30 LAB — COMPREHENSIVE METABOLIC PANEL
AG Ratio: 1.8 (calc) (ref 1.0–2.5)
ALT: 34 U/L (ref 9–46)
AST: 21 U/L (ref 10–40)
Albumin: 4.6 g/dL (ref 3.6–5.1)
Alkaline phosphatase (APISO): 67 U/L (ref 36–130)
BUN: 12 mg/dL (ref 7–25)
CO2: 33 mmol/L — ABNORMAL HIGH (ref 20–32)
Calcium: 9.3 mg/dL (ref 8.6–10.3)
Chloride: 100 mmol/L (ref 98–110)
Creat: 0.89 mg/dL (ref 0.60–1.35)
Globulin: 2.6 g/dL (calc) (ref 1.9–3.7)
Glucose, Bld: 181 mg/dL — ABNORMAL HIGH (ref 65–99)
Potassium: 4.1 mmol/L (ref 3.5–5.3)
Sodium: 138 mmol/L (ref 135–146)
Total Bilirubin: 1.1 mg/dL (ref 0.2–1.2)
Total Protein: 7.2 g/dL (ref 6.1–8.1)

## 2020-06-30 LAB — LIPID PANEL
Cholesterol: 112 mg/dL (ref <200)
HDL: 44 mg/dL (ref >=40)
LDL Cholesterol (Calc): 49 mg/dL (calc)
Non-HDL Cholesterol (Calc): 68 mg/dL (calc) (ref <130)
Total CHOL/HDL Ratio: 2.5 (calc) (ref <5.0)
Triglycerides: 102 mg/dL (ref <150)

## 2020-06-30 LAB — T3, FREE: T3, Free: 3.9 pg/mL (ref 2.3–4.2)

## 2020-06-30 LAB — MICROALBUMIN / CREATININE URINE RATIO
Creatinine, Urine: 259 mg/dL (ref 20–320)
Microalb Creat Ratio: 5 mcg/mg creat (ref <30)
Microalb, Ur: 1.3 mg/dL

## 2020-06-30 LAB — TSH: TSH: 1.37 mIU/L (ref 0.40–4.50)

## 2020-06-30 LAB — T4, FREE: Free T4: 1.1 ng/dL (ref 0.8–1.8)

## 2020-07-03 DIAGNOSIS — Z23 Encounter for immunization: Secondary | ICD-10-CM | POA: Insufficient documentation

## 2020-07-07 ENCOUNTER — Telehealth: Payer: Self-pay

## 2020-07-07 NOTE — Telephone Encounter (Signed)
Medical record request completed and requested records mailed to [facility] at [address] [place] [address], [place] [address].

## 2020-07-12 ENCOUNTER — Ambulatory Visit: Payer: Medicaid Other | Admitting: Nurse Practitioner

## 2020-07-14 ENCOUNTER — Encounter (INDEPENDENT_AMBULATORY_CARE_PROVIDER_SITE_OTHER): Payer: Self-pay

## 2020-07-27 ENCOUNTER — Telehealth (INDEPENDENT_AMBULATORY_CARE_PROVIDER_SITE_OTHER): Payer: Self-pay

## 2020-07-27 NOTE — Telephone Encounter (Signed)
sent in CMN for Glucose testing supplies.

## 2020-08-03 ENCOUNTER — Telehealth (INDEPENDENT_AMBULATORY_CARE_PROVIDER_SITE_OTHER): Payer: Self-pay

## 2020-08-03 NOTE — Telephone Encounter (Signed)
Received CMN glucose testing Supplies.

## 2020-08-04 ENCOUNTER — Other Ambulatory Visit (INDEPENDENT_AMBULATORY_CARE_PROVIDER_SITE_OTHER): Payer: Self-pay | Admitting: "Endocrinology

## 2020-08-04 DIAGNOSIS — E1065 Type 1 diabetes mellitus with hyperglycemia: Secondary | ICD-10-CM

## 2020-08-11 NOTE — Telephone Encounter (Signed)
Ben with Johnny [name] states that they need provider to initial CMN - he is faxing it back to the office.

## 2020-08-16 ENCOUNTER — Other Ambulatory Visit (INDEPENDENT_AMBULATORY_CARE_PROVIDER_SITE_OTHER): Payer: Self-pay | Admitting: "Endocrinology

## 2020-09-01 ENCOUNTER — Other Ambulatory Visit (INDEPENDENT_AMBULATORY_CARE_PROVIDER_SITE_OTHER): Payer: Self-pay | Admitting: Family

## 2020-09-01 ENCOUNTER — Other Ambulatory Visit (INDEPENDENT_AMBULATORY_CARE_PROVIDER_SITE_OTHER): Payer: Self-pay | Admitting: "Endocrinology

## 2020-09-01 DIAGNOSIS — IMO0002 Reserved for concepts with insufficient information to code with codable children: Secondary | ICD-10-CM

## 2020-09-01 DIAGNOSIS — E1065 Type 1 diabetes mellitus with hyperglycemia: Secondary | ICD-10-CM

## 2020-09-02 ENCOUNTER — Other Ambulatory Visit: Payer: Self-pay | Admitting: "Endocrinology

## 2020-09-16 ENCOUNTER — Telehealth (INDEPENDENT_AMBULATORY_CARE_PROVIDER_SITE_OTHER): Payer: Self-pay | Admitting: "Endocrinology

## 2020-09-16 NOTE — Telephone Encounter (Signed)
  Who's calling (name and relationship to patient) : Delman Cheadle  Best contact number: [phone] Provider they see: Fransico Michael Reason for call:     PRESCRIPTION REFILL ONLY  Name of prescription: Dexcom sensors  Pharmacy:

## 2020-09-19 ENCOUNTER — Telehealth (INDEPENDENT_AMBULATORY_CARE_PROVIDER_SITE_OTHER): Payer: Self-pay | Admitting: "Endocrinology

## 2020-09-19 ENCOUNTER — Other Ambulatory Visit (INDEPENDENT_AMBULATORY_CARE_PROVIDER_SITE_OTHER): Payer: Self-pay

## 2020-09-19 MED ORDER — DEXCOM G6 SENSOR MISC: 5 refills | Status: DC

## 2020-09-19 MED ORDER — GLUCAGEN HYPOKIT 1 MG IJ SOLR: INTRAMUSCULAR | 5 refills | Status: AC

## 2020-09-19 NOTE — Telephone Encounter (Signed)
Refill sent.

## 2020-09-19 NOTE — Telephone Encounter (Signed)
Who's calling (name and relationship to patient) : [facility]  Best contact number: [phone]  Provider they see: Dr. Fransico Michael  Reason for call:   Call ID:      PRESCRIPTION REFILL ONLY  Name of prescription: glucagen  Pharmacy[facility]

## 2020-09-29 ENCOUNTER — Other Ambulatory Visit: Payer: Self-pay

## 2020-09-29 ENCOUNTER — Encounter (INDEPENDENT_AMBULATORY_CARE_PROVIDER_SITE_OTHER): Payer: Self-pay | Admitting: "Endocrinology

## 2020-09-29 ENCOUNTER — Ambulatory Visit (INDEPENDENT_AMBULATORY_CARE_PROVIDER_SITE_OTHER): Payer: Medicaid Other | Admitting: "Endocrinology

## 2020-09-29 VITALS — BP 116/61 | HR 73 | Wt 160.6 lb

## 2020-09-29 DIAGNOSIS — E11649 Type 2 diabetes mellitus with hypoglycemia without coma: Secondary | ICD-10-CM

## 2020-09-29 DIAGNOSIS — F840 Autistic disorder: Secondary | ICD-10-CM

## 2020-09-29 DIAGNOSIS — E049 Nontoxic goiter, unspecified: Secondary | ICD-10-CM | POA: Diagnosis not present

## 2020-09-29 DIAGNOSIS — F4320 Adjustment disorder, unspecified: Secondary | ICD-10-CM

## 2020-09-29 DIAGNOSIS — E1065 Type 1 diabetes mellitus with hyperglycemia: Secondary | ICD-10-CM

## 2020-09-29 DIAGNOSIS — E063 Autoimmune thyroiditis: Secondary | ICD-10-CM

## 2020-09-29 LAB — POCT GLYCOSYLATED HEMOGLOBIN (HGB A1C): Hemoglobin A1C: 8.6 % — AB (ref 4.0–5.6)

## 2020-09-29 LAB — POCT GLUCOSE (DEVICE FOR HOME USE): POC Glucose: 193 mg/dl — AB (ref 70–99)

## 2020-09-29 NOTE — Progress Notes (Signed)
Subjective:  Patient Name: Johnny [name] Date of Birth: January 23, 1996  MRN: [id number]  Johnny [name]  presents at today's clinic visit for follow-up evaluation and management  of his T1DM, hypoglycemia, autism, ADHD, possible mental retardation, goiter, thyroiditis, adjustment reaction, and family history of autoimmune thyroid disease.  HISTORY OF PRESENT ILLNESS:   Johnny [name] is a 25 y.o. Caucasian young man.   Johnny [name] was accompanied by his mother.  1. Johnny [name] was seen at [facility] in [name], [place], on 06/26/13 for complaints of weight loss, fatigue, personality change, polyuria, polydipsia, and vomiting. New-onset DM and DKA were diagnosed. 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 Significantly abnormal lab results included: Venous pH 7.257, serum potassium 3.0, serum CO2 15, serum glucose 257, hemoglobin A1c 13.7%, C-peptide 0.19 (normal 0.80-3.09), anti-glutamic acid decarboxylase (GAD) antibody > 30 (normal < 1.0), anti-islet cell antibody 80 (normal < 5). TSH was 2.163, free T4 0.82, free T3 1.8 (normal 2.3-4.2). He was treated initially with infusions of insulin and fluids. When his DKA resolved, he was converted to a multiple daily injection (MDI) insulin plan, using Lantus as a basal insulin and Novolog aspart as a bolus insulin at mealtimes, bedtime, and 2 AM if needed. 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   2. Johnny [name] was discharged from [facility] on 07/04/13. He and his mother participated in our DSSP education program with Ms. Gearldine Bienenstock, RN. 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 When he went into the honeymoon period his Lantus dose was decreased to 6 units at night.  Since then, however, he has exited the honeymoon period and has had a gradual but progressive increase in his exogenous insulin requirement. He started his Dexcom G5 CGM in early 2016. He was converted to an Omnipod pump on 01/16/16. He was subsequently converted to a Dexcom G6.   3. Johnny [name]'s last [facility] clinic visit was on 06/29/20. At that visit I changed two of his ICRs to give him more insulin from 6 AM to midnight. Mom says those changes helped  A. In the interim he has been healthy. He still has a fine, red rash on his back and arms. He has not yet seen a dermatologist. His allergies continue to be better since moving to [place], [place].   B. His BGs have been "better. His BGs still vary with his carb intake. If he eats more than he takes insulin for, the BGs will be higher up. Johnny [name] has not had many low BGs.   C. His Omnipod pump has been working well overall. His Dexcom G6 has also been working well. Mom has continued to do FSBG testing using his Fastclix lancets. Mom sometimes has to reduce his food boluses.   Johnny [name] has not had the covid vaccinations. Neither has mom.. I encouraged both of them to get the vaccine.    E. His appetite is good. Mom still controls the number of carbs he takes in.   Johnny [name] still remains completely dependent on his mother for all of his basic needs,but he can forage for food at home..    4. [name]:  Constitutional: Johnny [name] feels "good".   Eyes: Vision seems to be good when he wears  his glasses. He had his last eye appointment in May 2019. The eye doctor did not see any signs of DM damage. He has a follow up eye appointment later this month.  Neck: Johnny [name] has not had any pains or swelling in the front part of his neck. There are no other recognized problems of the anterior neck.  Heart: There are no recognized heart problems. The ability to perform physical activities seems normal for his limited  level of physical activity.  Gastrointestinal:  He is still hungry. He no longer has postprandial bloating. Bowel movents seem normal. There are no recognized GI problems. Hands: No problems Legs: Muscle mass and strength seem normal. He can perform his usual ADLs and other physical activities without obvious discomfort. He has no numbness, tingling, burning, or pains. No edema is noted.  Feet: There are no obvious foot problems. No edema is noted.  Neurologic: There are no recognized problems with muscle movement and strength, sensation, or coordination. Hypoglycemia: Mom says that he has had fewer low BGs.   Skin: His skin is dry.  5. BG printout: We have data from the past 2 weeks. Average BG is 204, compared with 212 at his last visit and with 189 at his prior visit. BG range is 74-432, compared with 55-385 at his last visit. Marland Kitchen He checks BGs 5-13 times per day, average 11.6 times. Mom has been giving his boluses after meals.   6. CGM printout: We have data from the past 2 weeks. Average SG is 227. SGs average about 270 at midnight, 170 at breakfast, 230 at lunch, 240 at dinner, and 290 at bedtime. Most of his lower SGs occur between 7-10 AM. His highest SGs occur after dinner. He needs higher ICRs then.  PAST MEDICAL, FAMILY, AND SOCIAL HISTORY    Past Medical History:  Diagnosis Date  . ADHD (attention deficit hyperactivity disorder)   . Allergic rhinitis   . Autism   . Diabetes mellitus without complication (HCC)     Family History  Problem Relation Age of Onset  . Hypothyroidism Mother   . Kidney disease Mother        Mom unsure what her kidney issues are  . Autism Sister   . Mental retardation Sister   . Cancer Maternal Grandfather   . Diabetes Paternal Grandmother        Died at age 61.  . Other Other        Paternal great aunt with "hypoglycemia" unsure why     [name] Outpatient Medications:  .  Continuous Blood Gluc Receiver (DEXCOM G6 RECEIVER) DEVI, USE AS DIRECTED, Disp: 1 each, Rfl: 0 .  Continuous Blood Gluc  Sensor (DEXCOM G6 SENSOR) MISC, USE AS DIRECTED CHANGE EVERY 10 DAYS, Disp: 3 each, Rfl: 5 .  Continuous Blood Gluc Transmit (DEXCOM G6 TRANSMITTER) MISC, USE AS DIRECTED CHANGE EVERY 90 DAYS, Disp: 1 each, Rfl: 5 .  insulin aspart (NOVOLOG) 100 UNIT/ML injection, ADMINISTER 200 UNITS VIA INSULIN PUMP EVERY 48 HOURS, Disp: 40 mL, Rfl: 5 .  Accu-Chek FastClix Lancets MISC, USE AS DIRECTED TO CHECK BLOOD SUGAR 6 TIMES DAILY, Disp: 204 each, Rfl: 6 .  cetirizine (ZYRTEC) 10 MG tablet, Take 1 tablet (10 mg total) by mouth daily. (Patient not taking: Reported on 06/29/2020), Disp: 90 tablet, Rfl: 3 .  diphenhydrAMINE (BENADRYL) 25 MG tablet, Take 50 mg by mouth every 6 (six) hours as needed for itching or allergies. , Disp: , Rfl:  .  glucagon (GLUCAGEN  HYPOKIT) 1 MG SOLR injection, Use in instances of severe low glucose, or if unresponsive, Disp: 2 each, Rfl: 5 .  LANTUS SOLOSTAR 100 UNIT/ML Solostar Pen, USE UP TO 50 units DAILY in THE event pump fails (Patient not taking: Reported on 09/29/2020), Disp: 15 mL, Rfl: 5 .  NOVOLOG FLEXPEN 100 UNIT/ML FlexPen, USE UP TO 50 units DAILY in THE event pump fails (Patient not taking: Reported on 09/29/2020), Disp: 15 mL, Rfl: 5 .  triamcinolone (KENALOG) 0.025 % cream, Apply 1 application topically 2 (two) times daily. (Patient not taking: Reported on 06/29/2020), Disp: 240 g, Rfl: 3 .  Urine Glucose-Ketones Test STRP, If urine output and extreme thirst increases, collect urine and check with strip as directed. Disp 1 vial (Patient not taking: Reported on 06/29/2020), Disp: 20 each, Rfl: 3  Allergies as of 09/29/2020 - Review Complete 06/29/2020  Allergen Reaction Noted  . Other  03/22/2020     reports that he has never smoked. He has never used smokeless tobacco. He reports that he does not drink alcohol. Pediatric History  Patient Parents  . Langston ReusingBarnes,Elaine (Mother)   Other Topics Concern  . Not on file  Social History Narrative   Lives with Mom and Sister  (17y).  Mom quit smoking 18 years ago.  She has 2 other grown children.  They have a dog at home.  No recent travel.   1. School and Family: He completed his education program. [place] [facility] [place]. Mom has health insurance as well. Family moved to [place] on 05/24/19. Mom wants me to continue to see Johnny [name], but we also discussed transferring his care to an adult endocrinologist in the [place] area, such as [facility].  2. Activities: His play and video games.  3. Primary Care Provider: His new PCP is a NP named Marchelle Folksmanda Day in [facility], [phone].  4. Psychiatrist: None at present  REVIEW OF SYSTEMS: There are no other significant problems involving Johnny [name]'s other body systems.   Objective:   BP 116/61   Pulse 73   Wt 160 lb 9.6 oz (72.8 kg)   BMI 24.91 kg/m   Growth percentile SmartLinks can only be used for patients less than 23 years old.  Wt Readings from Last 3 Encounters:  09/29/20 160 lb 9.6 oz (72.8 kg)  06/29/20 161 lb (73 kg)  06/06/20 160 lb 12.8 oz (72.9 kg)    Ht Readings from Last 3 Encounters:  06/29/20 5' 7.32" (1.71 m)  06/06/20 5\' 6"  (1.676 m)  01/12/20 5\' 6"  (1.676 m)    HC Readings from Last 3 Encounters:  [facility]   Facility age limit for growth percentiles is 20 years.  Body mass index is 24.91 kg/m. Facility age limit for growth percentiles is 20 years.  Body surface area is 1.86 meters squared.   Constitutional:  Johnny [name] looks good today. His weight is stable. He is bright and alert, more mature and interactive, but still significantly cognitively disabled.  Eyes: There is no arcus or proptosis.  Mouth: The oropharynx appears normal. The tongue appears normal. There is normal oral moisture. There is no obvious gingivitis. Neck: There are no bruits present. The thyroid gland appears enlarged. The thyroid gland is diffusely enlarged, but smaller at approximately 21 grams in size. The  consistency of the thyroid gland is full. There is no thyroid tenderness to palpation. Lungs: The lungs are clear. Air movement is good. Heart: The heart rhythm  and rate appear normal. Heart sounds S1 and S2 are normal. I do not appreciate any pathologic heart murmurs. Abdomen: The abdominal size is mildly enlarged. Bowel sounds are normal. The abdomen is soft and non-tender. There is no obviously palpable hepatomegaly, splenomegaly, or other masses.  Arms: Muscle mass appears appropriate for age.  Hands: There is no obvious tremor. Phalangeal and metacarpophalangeal joints appear normal. Palms are normal. Legs: Muscle mass appears appropriate for age. There is no edema.  Feet: There are no significant deformities. Dorsalis pedis pulses are normal 1+bilaterally.  Neurologic: Muscle strength is normal for age and gender  in both the upper and the lower extremities. Muscle tone appears normal. Sensation to touch is normal in the legs and feet. Skin: He has a very fine, punctate red rash on his back and arms  LAB DATA:  Results for orders placed or performed in visit on 09/29/20 (from the past 504 hour(s))  POCT Glucose (Device for Home Use)   Collection Time: 09/29/20  2:44 PM  Result Value Ref Range   Glucose Fasting, POC     POC Glucose 193 (A) 70 - 99 mg/dl  POCT glycosylated hemoglobin (Hb A1C)   Collection Time: 09/29/20  2:50 PM  Result Value Ref Range   Hemoglobin A1C 8.6 (A) 4.0 - 5.6 %   HbA1c POC (<> result, manual entry)     HbA1c, POC (prediabetic range)     HbA1c, POC (controlled diabetic range)      LABS  Labs 09/29/20: HbA1c 8.6%, CBG 193  Labs 06/29/20: HbA1c 9.2%, CBG 220; TSH 1.37, free T4 1.1, free T3 3.9; CMP normal, except glucose 181 and CO2 33; urinary microalbumin/creatinine 5  Labs 04/14/19: HbA1c 8.2%, CBG 307  Labs 03/31/19: TSH 1.20, free T4 1.1, free T3 3.6; CMP normal; cholesterol 111, triglycerides 94, HDL 44, LDL 94;  microalbumin/creatinine ratio  3  Labs 08/07/18: HbA1c 8.8%, CBG 204  Labs 05/07/18: HbA1c 9.0%, CBG 171  Labs 04/16/18: TSH 2.081  Labs 04/15/18: venous pH 7.07; serum CO2 9, creatinine 1.49; Urine glucose >500 and ketones 80  Labs 02/07/18: HbA1c 10.0%  Labs 02/05/18: CBG 251  Labs 12/03/17: CBG 334  Labs 09/30/17: HbA1c 8.7%, CBG 254  Labs 07/24/17: CBG 263; TSH 2.18, free T4 1.1, free T3 3.3; urine microalbumin/creatinine ration 2; CMP normal except glucose 263  Labs 05/07/17: HbA1c 9.3%, CBG 246  Labs 03/04/17: CBG 494. He had not bolused after breakfast due to mom putting in a new pod just before they left home to drive to his appointment today.  Labs 12/26/16: HbA1c 10.2%, CBG 289  Labs 09/25/16: HbA1c 9.5%, CBG 261  Labs 06/27/16: CBG 114; TSH 1.54, free T4 1.1, free T3 3.4; CMP normal; urine microalbumin/creatinine ratio 2; cholesterol 108, triglycerides 153, HDL 43, LDL 34  Labs 04/10/16: HbA1c 9.5%, CBG 145  Labs 02/01/16: CBG 391, TSH 1.72  Labs 12/21/15: HbA1c 8.4%  Labs 10/10/15: HbA1c 8.2%.  Labs 08/09/15: HbA1c 8.5%  Labs 1012/16: HbA1c 8.8%; TSH 2.624, free T4 0.99, free T3 3.4; urine microalbumin/creatinine ratio <0.2; CMP normal  Labs 01/27/15: HbA1c 8.8%  Labs 10/25/14: Hemoglobin A1c 8.2%, compared with 7.9% at the last visit and with 8.4% at the prior visit.    Labs 10/22/13: CMP normal; C-peptide 0.65, increased from 0.19 at diagnosis (normal 0.80-3.90); TSH 1.265, free T4 1.01, free T3 3.4, TPO antibody 10.4    Assessment and Plan:   ASSESSMENT:  1-3. T1DM/hypoglycemia/hypoglycemia unawareness  A. Since exiting the honeymoon  period, Leotha has required more exogenous insulin than he needed before. His BGs have been much more variable over time.   B. His HbA1c and average BG are lower, but his BGs at bedtime and midnight are higher. He still sometimes may have inaccurate carb counts at certain meals.   C. He sometimes reduces his insulin doses at mealtimes in order to avoid having  hypoglycemia. He has not had many low BGs.   E. Ms. Zachery Dauer is doing a very good job overall of taking care of Vander.     4-5. Goiter/thyroiditis:   A. He has the personal history of autoimmune T1DM. He also has the family history of autoimmune thyroid disease.  B. His thyroid gland is less enlarged today. His pattern of waxing and waning of thyroid gland size and thyroid lobe sizes is c/w evolving Hashimoto's thyroiditis. It is very likely that he will become hypothyroid in the future.   C.  He was euthyroid in April 2015, in October 2016, in January 2019, in September 2019, in September 2020, and again in December 2021. He is clinically euthyroid now.   6. Adjustment reaction: Things are going pretty well overall. Mom is trying very hard to take good care of Corgan.  7. Autism/ADHD/ mental retardation: Yao's autism has improved somewhat in that he is more social and outgoing. However, he remains completely dependent upon mom for his meals, shelter, clothing, recreational activities, physical activities, and T1DM care. 8. Peripheral neuropathy: His neuropathy was not evident at his last clinic visit, in December 2020, in May 2021, in December 2021, or today in March 2022.Marland Kitchen He says he is not having any symptoms today.   PLAN:  1. Diagnostic: We reviewed his BG and Dexcom readings. We reviewed his annual lab test results. 2. Therapeutic: Continue Small bedtime snack. Give a correction bolus before meals and a food bolus after meals.  Continue basal rates: MN: 1.35 4 AM: 1.50 8 AM: 1.15 Noon: 0.95 6 PM: 1.25  Continue ISF: 50  Continue BG targets: MN: 180 6 AM: 150 9 PM: 180  Change ICRs: MN: 15  6 AM: 18 Noon: 22 -> 18  3. Patient education: We discussed his elevated BGs, his hypoglycemia, and all of the above at length. I again asked mom to call me if Reyden has any BGs during the night or in the mornings that are <80 and unexplained. I also asked her to keep a log of unusual high  BGs or lower BGs and the causes. We discussed possibly seeking out an adult endocrinologist for [place] in [place]. For the present, however, mom wants to bring Vihaan back to see me.   4. Follow-up: three months for an afternoon appointment.  Level of Service: This visit lasted in excess of 45 minutes. More than 50% of the visit was devoted to counseling.   [name], MD, CDE Adult and Pediatric Endocrinology

## 2020-09-29 NOTE — Patient Instructions (Signed)
Follow up visit in 3 months. 

## 2020-10-17 ENCOUNTER — Other Ambulatory Visit (INDEPENDENT_AMBULATORY_CARE_PROVIDER_SITE_OTHER): Payer: Self-pay | Admitting: Family

## 2020-10-17 DIAGNOSIS — IMO0002 Reserved for concepts with insufficient information to code with codable children: Secondary | ICD-10-CM

## 2020-10-17 DIAGNOSIS — E1065 Type 1 diabetes mellitus with hyperglycemia: Secondary | ICD-10-CM

## 2020-11-08 ENCOUNTER — Other Ambulatory Visit: Payer: Self-pay

## 2020-11-17 ENCOUNTER — Other Ambulatory Visit: Payer: Self-pay

## 2020-11-18 ENCOUNTER — Other Ambulatory Visit: Payer: Self-pay

## 2020-12-02 ENCOUNTER — Telehealth (INDEPENDENT_AMBULATORY_CARE_PROVIDER_SITE_OTHER): Payer: Self-pay | Admitting: "Endocrinology

## 2020-12-02 NOTE — Telephone Encounter (Signed)
Who's calling (name and relationship to patient) : Mr. Manson Passey with [facility]  Best contact number: [phone]  Provider they see: Dr. Cyndee Brightly  Reason for call: States they received a referral for patient but have no information on medications or recent labs. Requesting this be sent to complete referral  Call ID:      PRESCRIPTION REFILL ONLY  Name of prescription:  Pharmacy:

## 2020-12-05 ENCOUNTER — Encounter: Payer: Medicaid Other | Admitting: Nurse Practitioner

## 2020-12-06 NOTE — Telephone Encounter (Signed)
Spoke with [employer]. The patient has already been seen there. The rep states that they dont need any further paperwork for the patient.

## 2020-12-20 ENCOUNTER — Encounter (INDEPENDENT_AMBULATORY_CARE_PROVIDER_SITE_OTHER): Payer: Self-pay

## 2020-12-20 ENCOUNTER — Telehealth (INDEPENDENT_AMBULATORY_CARE_PROVIDER_SITE_OTHER): Payer: Self-pay | Admitting: "Endocrinology

## 2020-12-20 ENCOUNTER — Other Ambulatory Visit: Payer: Self-pay

## 2020-12-20 ENCOUNTER — Ambulatory Visit (INDEPENDENT_AMBULATORY_CARE_PROVIDER_SITE_OTHER): Payer: Medicaid Other | Admitting: Pharmacist

## 2020-12-20 DIAGNOSIS — E1065 Type 1 diabetes mellitus with hyperglycemia: Secondary | ICD-10-CM

## 2020-12-20 MED ORDER — ACETONE (URINE) TEST VI STRP: 1.0000 | ORAL_STRIP | 6 refills | Status: AC | PRN

## 2020-12-20 MED ORDER — PEN NEEDLES 32G X 4 MM MISC: 11 refills | Status: AC

## 2020-12-20 NOTE — Telephone Encounter (Signed)
Who's calling (name and relationship to patient) : [name]  Best contact number: [phone]  Provider they see: Dr. Fransico Michael  Reason for call: Patients blood sugar is so high that his omnipod is saying high. Was given a shot in stomach of 6 units and sugars are not coming down. Exercising isn't working. He is drinking a lot of water. He says he is really dizzy and is having a hard time doing anything else.  Bennie Pierini were checked but his strips are out of date.  Mom took him to another doctor to try to have an emergency provider closer to home and that emergency provider changed settings on omnipod and has been high since.   Call ID:      PRESCRIPTION REFILL ONLY  Name of prescription:  Pharmacy:

## 2020-12-20 NOTE — Progress Notes (Addendum)
This is a Pediatric Specialist virtual follow up consult provided via telephone. Johnny [name] and parent Johnny [name] consented to an telephone visit consult today.  Location of patient: Priest Lockridge and Johnny [name] are at home. Location of provider: Zachery Conch, PharmD, CPP, CDCES is at office.   I connected with Johnny [name] parent Johnny [name] on 12/20/20 by telephone and verified that I am speaking with the correct person using two identifiers. Patient recently saw a new provider who adjusted his insulin pump settings on 12/02/20. Patient's blood sugars have said "high" all day today since about 12pm. Mother gave him a Novolog shot of 6 units 1:25 pm; she reports she determined dose of 6 units. She reviewed diabetes survival skills book to determine appropriate dose; she rounded dose down to prevent hypoglycemia. She is having him drink water. She has had him check ketone strips (expired dose). He uses a Dexcom G6 CGM and Omnipod (original / classic).   Dr. Fransico Michael Omnipod Original Pump Settings(Before 12/02/20) Summary of visit - At Dr. Juluis Mire last visit, patient's BG was ~227. Most of his lower SGs occur between 7-10 AM. His highest SGs occur after dinner. He needs higher ICRs then. There is no mention of basal/bolus rate or total daily dose. Dr. Fransico Michael changed ICR Noon: 22 -> 18.  Continue basal rates: MN: 1.35 4 AM: 1.50 8 AM: 1.15 Noon: 0.95 6 PM: 1.25  Continue ISF: 50  Continue BG targets: MN: 180 6 AM: 150 9 PM: 180  Change ICRs: MN: 15  6 AM: 18 Noon: 22 -> 18  Dr. Vilinda Blanks Original Pump Settings (12/03/20 - Current) Summary of visit - TIR was about 35%, Serious low(TBR) (BG<54) 0%, Time below Range(TBR)(BG<70) 0.1% at Dr. Lizbeth Bark visit. Dr. Vilinda Blanks does note that patient was receiving 80% basal, 20% bolus and patient was at high risk of hypoglycemia considering extent of over-basaled. Dr. Vilinda Blanks reduced basal rates ~50%.   Pump settings Time Basal Rate  (units/ hr)  0000 1.35-->0.75  0400 1.50-->0.75  0800 1.15-->0.65  1200 0.95-->0.45  1800 1.25-->0.65   Total 24-hour basal: 29.2-->15.2  BOLUS SETTINGS: Insulin:Carb Ratio:  0000 15 0600 18  Sensitivity: 50  Target BG:  0000 180 0600 150 2100 180 Active Insulin Time: 3 hours-->4   Dexcom G6 CGM Report    Assessment TIR is not at goal > 70%. No hypoglycemia. Patient is experiencing significant hyperglycemia after dose changes on 12/03/20. TIR decreased from 35% --> 4%. I assisted family with teaching them how to upload Dexcom receiver to [employer]. Unfortunately due to time constraints I was unable to assist with downloading Omnipod to computer. Considering recent hyperglycemia and prior endocrinologist visits with Dr. Concepcion Elk. Vilinda Blanks will increase basal rates about 20%. Will work closely with family to assist with making basal/bolus ratio 40% basal 60% bolus or 50% basal 50% bolus to improve BG control. Spent time teaching mother how to use temp basal rate (advised her to try increase by 10% x 1 hour and if that does not work then try 15% for 1 hour then if that does not work try 20% for 1 hour). Also reviewed bad pump site management. Encouraged mother for appropriate hyperglycemia management thus far. Follow up in 2 days.   Plan 1. Change basal rates Dr. Fransico Michael settings --> Dr. Vilinda Blanks settings --> My changes MN: 1.35 --> 0.75 --> 1.05 4 AM: 1.50 --> 0.75 --> 1.0 8 AM: 1.15 --> 0.65 --> 0.9 Noon: 0.95 --> 0.45 --> 0.7 6 PM:  1.25 --> 0.65 --> 0.95 Total Daily: 29.2-->15.2 --> 21.7   2. Follow up: 2 days (send instructions on [employer] account to MyChart, bad pump site management protocol, and hyperglycemia protocol to mother; [email or web address] or [email or web address])   This appointment required 90 minutes of patient care (this includes precharting, chart review, review of results, virtual care, etc.).  Time spent since initial appt on 12/20/20: 90  minutes   Thank you for involving clinical pharmacist/diabetes educator to assist in providing this patient's care.   Zachery Conch, PharmD, CPP, CDCES

## 2020-12-20 NOTE — Telephone Encounter (Signed)
Spoke with mom, he saw another provider and they made lots of changes.  His basal is at 0.45 currently.  I reviewed Dr. Juluis Mire last note and his basal was much higher.  Sent last settings to mom in my chart message.  Mom has not given an injection during this time as she was awaiting to see if these changes were working and she knows that changes to settings to take but his blood sugars are only getting higher.  He has mentioned to mom that he feels worse and something needs to change. Mom is concerned that he may need to go to the ER.  She did check his ketones and it shows trace.  Messaged Dr. Ladona Ridgel and she will call mom to follow up at 2:30 to see if she can be of further assistance to help.  Mom verified that next in person appointment is on 6/16 with Dr. Fransico Michael.

## 2020-12-21 NOTE — Progress Notes (Deleted)
This is a Pediatric Specialist virtual follow up consult provided via telephone. Johnny [name] and parent Johnny [name] consented to an telephone visit consult today.  Location of patient: Johnny [name] and Johnny [name] are at home. Location of provider: Zachery Conch, PharmD, CPP, CDCES is at office.   I connected with Deretha Emory parent Johnny [name] on 12/22/20 by telephone and verified that I am speaking with the correct person using two identifiers. ***  Omnipod (original)  Basal rates: MN: 1.05 4 AM: 1.0 8 AM: 0.9 Noon: 0.7 6 PM: 0.95 Total Daily: 21.7   ICRs: MN: 15  6 AM: 18 Noon: 18  ISF MN: 50  BG Targets MN: 180 6 AM: 150 9 PM: 180   Dexcom G6 CGM Report ***  Assessment TIR is *** at goal > 70%. *** hypoglycemia. ***   Plan 1. *** 2. Follow up: ***  This appointment required *** minutes of patient care (this includes precharting, chart review, review of results, virtual care, etc.).  Time spent since initial appt on 12/20/20: 90 minutes   Thank you for involving clinical pharmacist/diabetes educator to assist in providing this patient's care.   Zachery Conch, PharmD, CPP, CDCES

## 2020-12-21 NOTE — Telephone Encounter (Signed)
Please review office visit (diabetes management/sugar call) with myself on 12/20/20 regarding diabetes management.  Thank you for involving clinical pharmacist/diabetes educator to assist in providing this patient's care.   Zachery Conch, PharmD, CPP, CDCES

## 2020-12-22 ENCOUNTER — Ambulatory Visit (INDEPENDENT_AMBULATORY_CARE_PROVIDER_SITE_OTHER): Payer: Medicaid Other | Admitting: Pharmacist

## 2021-01-05 ENCOUNTER — Ambulatory Visit (INDEPENDENT_AMBULATORY_CARE_PROVIDER_SITE_OTHER): Payer: Medicaid Other | Admitting: "Endocrinology

## 2021-01-05 ENCOUNTER — Other Ambulatory Visit: Payer: Self-pay

## 2021-01-05 ENCOUNTER — Encounter (INDEPENDENT_AMBULATORY_CARE_PROVIDER_SITE_OTHER): Payer: Self-pay | Admitting: "Endocrinology

## 2021-01-05 VITALS — BP 104/70 | HR 78 | Wt 154.2 lb

## 2021-01-05 DIAGNOSIS — E049 Nontoxic goiter, unspecified: Secondary | ICD-10-CM

## 2021-01-05 DIAGNOSIS — F4320 Adjustment disorder, unspecified: Secondary | ICD-10-CM

## 2021-01-05 DIAGNOSIS — E063 Autoimmune thyroiditis: Secondary | ICD-10-CM

## 2021-01-05 DIAGNOSIS — E10649 Type 1 diabetes mellitus with hypoglycemia without coma: Secondary | ICD-10-CM

## 2021-01-05 DIAGNOSIS — F840 Autistic disorder: Secondary | ICD-10-CM

## 2021-01-05 DIAGNOSIS — E1065 Type 1 diabetes mellitus with hyperglycemia: Secondary | ICD-10-CM | POA: Diagnosis not present

## 2021-01-05 DIAGNOSIS — E1042 Type 1 diabetes mellitus with diabetic polyneuropathy: Secondary | ICD-10-CM

## 2021-01-05 LAB — POCT GLUCOSE (DEVICE FOR HOME USE): POC Glucose: 274 mg/dl — AB (ref 70–99)

## 2021-01-05 NOTE — Patient Instructions (Signed)
Follow up visit in 3 months. Please send in Dexcom report in two weeks.

## 2021-01-05 NOTE — Progress Notes (Signed)
Subjective:  Patient Name: Johnny [name] Date of Birth: 1996/03/12  MRN: [id number]  Johnny [name]  presents at today's clinic visit for follow-up evaluation and management  of his T1DM, hypoglycemia, autism, ADHD, possible mental retardation, goiter, thyroiditis, adjustment reaction, and family history of autoimmune thyroid disease.  HISTORY OF PRESENT ILLNESS:   Johnny [name] is a 25 y.o. Caucasian young man.   Johnny [name] was accompanied by his mother.  1. Johnny [name] was seen at [facility] in [place], [place], on 06/26/13 for complaints of weight loss, fatigue, personality change, polyuria, polydipsia, and vomiting. New-onset DM and DKA were diagnosed. He received fluids at the ED at [facility], was transferred to [facility], and was admitted to the PICU for evaluation and management of new-onset T1DM, DKA, dehydration, and ketonuria in the setting of pre-existing autism, ADHD, and possible mental retardation. Significantly abnormal lab results included: Venous pH 7.257, serum potassium 3.0, serum CO2 15, serum glucose 257, hemoglobin A1c 13.7%, C-peptide 0.19 (normal 0.80-3.09), anti-glutamic acid decarboxylase (GAD) antibody > 30 (normal < 1.0), anti-islet cell antibody 80 (normal < 5). TSH was 2.163, free T4 0.82, free T3 1.8 (normal 2.3-4.2). He was treated initially with infusions of insulin and fluids. When his DKA resolved, he was converted to a multiple daily injection (MDI) insulin plan, using Lantus as a basal insulin and Novolog aspart as a bolus insulin at mealtimes, bedtime, and 2 AM if needed. 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   2. Johnny [name] was discharged from [facility] on 07/04/13. He and his mother participated in our DSSP education program with Ms. Johnny Bienenstock, RN. 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 When he went into the honeymoon period his Lantus dose was decreased to 6 units at  night. Since then, however, he has exited the honeymoon period and has had a gradual but progressive increase in his exogenous insulin requirement. He started his Dexcom G5 CGM in early 2016. He was converted to an Omnipod pump on 01/16/16. He was subsequently converted to a Dexcom G6.   3. [name] occurred on 09/29/20. At that visit I changed one of his ICRs to give him more insulin from noon to midnight. Mom says that change helped for awhile.  A. In the interim he has been healthy. He still has a fine, red rash on his back and arms. He has not yet seen a dermatologist. His allergies continue to be better since moving to [place], [place].   B. He went to a doctor in [place] at the [facility], Dr. Vilinda [name], on 12/02/20. Dr. Vilinda [name] thought that he was taking too much insulin for his body weight. She reduced his insulin pump basal rate settings an average of 45%. Johnny [name] then had many HIs. Mom called Dr. Ladona [name] who increased his settings by about 20%. His BGs have been "much better" since then.  C. His BGs still vary with his carb intake. If he eats more than he takes insulin for, the BGs will be higher up. Johnny [name] has not had any low BGs.   D. His Omnipod pump has been working well overall. His Dexcom G6 has also been working well. Mom has continued to do FSBG testing using his Fastclix lancets. Mom sometimes has to reduce his food boluses.   Johnny [name] has not had the covid vaccinations. Neither has mom.. I encouraged both of them to get  the vaccine.    G. His appetite is good. Mom still controls the number of carbs he takes in.   Johnny [name] still remains completely dependent on his mother for all of his basic needs,but he can forage for food at home..    4. [name]:  Constitutional: Johnny [name] feels "good".   Eyes: Vision seems to be good when he wears his glasses. He had his last eye appointment in May 2019. The eye doctor did  not see any signs of DM damage. He has a follow up eye appointment later this month.  Neck: Johnny [name] has not had any pains or swelling in the front part of his neck. There are no other recognized problems of the anterior neck.  Heart: There are no recognized heart problems. The ability to perform physical activities seems normal for his limited  level of physical activity.  Gastrointestinal: He is still hungry. He no longer has postprandial bloating. Bowel movents seem normal. There are no recognized GI problems. Hands: No problems Legs: Muscle mass and strength seem normal. He can perform his usual ADLs and other physical activities without obvious discomfort. He has no numbness, tingling, burning, or pains. No edema is noted.  Feet: There are no obvious foot problems. No edema is noted.  Neurologic: There are no recognized problems with muscle movement and strength, sensation, or coordination. Hypoglycemia: Mom says that he has had fewer low BGs.   Skin: His skin is dry.  5. BG printout: We have data from the past 2 weeks. He checks BGs 6-14 times per day, average 11 times. Average BG is 243, compared with 204 at his last visit and with 212 at his prior visit. BG range is 104-HI, compared with 77-432 at his last visit and with 55-385 at his prior visit. Mom has been giving his boluses after meals.   6. CGM printout: We have data from the past 2 weeks. Average SG is 251, compared with 227 at his last visit. SGs average about 270 at midnight, 220 at breakfast, 260 at lunch, 240 at dinner, and 240 at bedtime. Most of his lower SGs occur around 11 AM and again between 8-11 PM. His highest SGs occur after lunch. He needs higher basal rated.  PAST MEDICAL, FAMILY, AND SOCIAL HISTORY    Past Medical History:  Diagnosis Date   ADHD (attention deficit hyperactivity disorder)    Allergic rhinitis    Autism    Diabetes mellitus without complication (HCC)     Family History  Problem Relation Age of  Onset   Hypothyroidism Mother    Kidney disease Mother        Mom unsure what her kidney issues are   Autism Sister    Mental retardation Sister    Cancer Maternal Grandfather    Diabetes Paternal Grandmother        Died at age 12.   Other Other        Paternal great aunt with "hypoglycemia" unsure why     [name] Outpatient Medications:    Accu-Chek FastClix Lancets MISC, USE AS DIRECTED TO CHECK BLOOD SUGAR 6 TIMES DAILY, Disp: 204 each, Rfl: 6   acetone, urine, test strip, 1 strip by Does not apply route as needed for high blood sugar., Disp: 200 strip, Rfl: 6   cetirizine (ZYRTEC) 10 MG tablet, Take 1 tablet (10 mg total) by mouth daily., Disp: 90 tablet, Rfl: 3   Continuous Blood Gluc Receiver (DEXCOM G6 RECEIVER) DEVI, USE  AS DIRECTED, Disp: 1 each, Rfl: 0   Continuous Blood Gluc Sensor (DEXCOM G6 SENSOR) MISC, USE AS DIRECTED CHANGE EVERY 10 DAYS, Disp: 3 each, Rfl: 5   Continuous Blood Gluc Transmit (DEXCOM G6 TRANSMITTER) MISC, USE AS DIRECTED CHANGE EVERY 90 DAYS, Disp: 1 each, Rfl: 5   diphenhydrAMINE (BENADRYL) 25 MG tablet, Take 50 mg by mouth every 6 (six) hours as needed for itching or allergies. , Disp: , Rfl:    insulin aspart (NOVOLOG) 100 UNIT/ML injection, administer 200 units via isulin pump EVERY 48 hours, Disp: 40 mL, Rfl: 5   LANTUS SOLOSTAR 100 UNIT/ML Solostar Pen, USE UP TO 50 units DAILY in THE event pump fails, Disp: 15 mL, Rfl: 5   NOVOLOG FLEXPEN 100 UNIT/ML FlexPen, USE UP TO 50 units DAILY in THE event pump fails, Disp: 15 mL, Rfl: 5   glucagon (GLUCAGEN HYPOKIT) 1 MG SOLR injection, Use in instances of severe low glucose, or if unresponsive, Disp: 2 each, Rfl: 5   Insulin Pen Needle (PEN NEEDLES) 32G X 4 MM MISC, Use with insulin pen up to 6x per day, Disp: 200 each, Rfl: 11   triamcinolone (KENALOG) 0.025 % cream, Apply 1 application topically 2 (two) times daily. (Patient not taking: Reported on 06/29/2020), Disp: 240 g, Rfl: 3   Urine Glucose-Ketones  Test STRP, If urine output and extreme thirst increases, collect urine and check with strip as directed. Disp 1 vial (Patient not taking: Reported on 06/29/2020), Disp: 20 each, Rfl: 3  Allergies as of 01/05/2021 - Review Complete 01/05/2021  Allergen Reaction Noted   Other  03/22/2020     reports that he has never smoked. He has never used smokeless tobacco. He reports that he does not drink alcohol. Pediatric History  Patient Parents   [name] (Mother)   Other Topics Concern   Not on file  Social History Narrative   Lives with Mom and Sister (17y).  Mom quit smoking 18 years ago.  She has 2 other grown children.  They have a dog at home.  No recent travel.   1. School and Family: He completed his education program. He has health insurance through [facility]. Mom has health insurance as well. Family moved to [place] on 05/24/19. Mom went to [facility] for care to see if she could find a doctor in [place] who would take care of Clemmie.  2. Activities: His play and video games.  3. Primary Care Provider: His new PCP is a NP named Marchelle Folks Day in [facility], [phone].  4. Psychiatrist: None at present  REVIEW OF SYSTEMS: There are no other significant problems involving Hristopher's other body systems.   Objective:   BP 104/70 (BP Location: Left Arm, Patient Position: Sitting, Cuff Size: Normal)   Pulse 78   Wt 154 lb 3.2 oz (69.9 kg)   BMI 23.92 kg/m   Growth percentile SmartLinks can only be used for patients less than 56 years old.  Wt Readings from Last 3 Encounters:  01/05/21 154 lb 3.2 oz (69.9 kg)  09/29/20 160 lb 9.6 oz (72.8 kg)  06/29/20 161 lb (73 kg)    Ht Readings from Last 3 Encounters:  06/29/20 5' 7.32" (1.71 m)  06/06/20 5\' 6"  (1.676 m)  01/12/20 5\' 6"  (1.676 m)    HC Readings from Last 3 Encounters:  No data found for [facility]   Facility age limit for growth percentiles is 20 years.  Body mass index is 23.92 kg/m. Facility age  limit for  growth percentiles is 20 years.  Body surface area is 1.82 meters squared.   Constitutional:  [name] looks good today. His weight has decreased 6 pounds, in part due to underinsulinization. He is bright and alert, more mature and interactive, but still significantly cognitively disabled.  Eyes: There is no arcus or proptosis.  Mouth: The oropharynx appears normal. The tongue appears normal. There is normal oral moisture. There is no obvious gingivitis. Neck: There are no bruits present. The thyroid gland appears enlarged. The thyroid gland is diffusely more enlarged at approximately 22 grams in size. The consistency of the thyroid gland is full. There is no thyroid tenderness to palpation. Lungs: The lungs are clear. Air movement is good. Heart: The heart rhythm and rate appear normal. Heart sounds S1 and S2 are normal. I do not appreciate any pathologic heart murmurs. Abdomen: The abdominal size is mildly enlarged. Bowel sounds are normal. The abdomen is soft and non-tender. There is no obviously palpable hepatomegaly, splenomegaly, or other masses.  Arms: Muscle mass appears appropriate for age.  Hands: There is no obvious tremor. Phalangeal and metacarpophalangeal joints appear normal. Palms are normal. Legs: Muscle mass appears appropriate for age. There is no edema.  Feet: There are no significant deformities. Dorsalis pedis pulses are normal 1+bilaterally.  Neurologic: Muscle strength is normal for age and gender  in both the upper and the lower extremities. Muscle tone appears normal. Sensation to touch is normal in the legs and feet. Skin: He has a very fine, punctate red rash on his back and arms  LAB DATA:  Results for orders placed or performed in visit on 01/05/21 (from the past 504 hour(s))  POCT Glucose (Device for Home Use)   Collection Time: 01/05/21  2:46 PM  Result Value Ref Range   Glucose Fasting, POC     POC Glucose 274 (A) 70 - 99 mg/dl     LABS  Labs 6/96/296/16/22: CBG  274  Labs 12/02/20: HbA1c 8.6%, CBG 348  Labs 09/29/20: HbA1c 8.6%, CBG 193  Labs 06/29/20: HbA1c 9.2%, CBG 220; TSH 1.37, free T4 1.1, free T3 3.9; CMP normal, except glucose 181 and CO2 33; urinary microalbumin/creatinine 5  Labs 04/14/19: HbA1c 8.2%, CBG 307  Labs 03/31/19: TSH 1.20, free T4 1.1, free T3 3.6; CMP normal; cholesterol 111, triglycerides 94, HDL 44, LDL 94;  microalbumin/creatinine ratio 3  Labs 08/07/18: HbA1c 8.8%, CBG 204  Labs 05/07/18: HbA1c 9.0%, CBG 171  Labs 04/16/18: TSH 2.081  Labs 04/15/18: venous pH 7.07; serum CO2 9, creatinine 1.49; Urine glucose >500 and ketones 80  Labs 02/07/18: HbA1c 10.0%  Labs 02/05/18: CBG 251  Labs 12/03/17: CBG 334  Labs 09/30/17: HbA1c 8.7%, CBG 254  Labs 07/24/17: CBG 263; TSH 2.18, free T4 1.1, free T3 3.3; urine microalbumin/creatinine ration 2; CMP normal except glucose 263  Labs 05/07/17: HbA1c 9.3%, CBG 246  Labs 03/04/17: CBG 494. He had not bolused after breakfast due to mom putting in a new pod just before they left home to drive to his appointment today.  Labs 12/26/16: HbA1c 10.2%, CBG 289  Labs 09/25/16: HbA1c 9.5%, CBG 261  Labs 06/27/16: CBG 114; TSH 1.54, free T4 1.1, free T3 3.4; CMP normal; urine microalbumin/creatinine ratio 2; cholesterol 108, triglycerides 153, HDL 43, LDL 34  Labs 04/10/16: HbA1c 9.5%, CBG 145  Labs 02/01/16: CBG 391, TSH 1.72  Labs 12/21/15: HbA1c 8.4%  Labs 10/10/15: HbA1c 8.2%.  Labs 08/09/15: HbA1c 8.5%  Labs 1012/16: HbA1c  8.8%; TSH 2.624, free T4 0.99, free T3 3.4; urine microalbumin/creatinine ratio <0.2; CMP normal  Labs 01/27/15: HbA1c 8.8%  Labs 10/25/14: Hemoglobin A1c 8.2%, compared with 7.9% at the last visit and with 8.4% at the prior visit.    Labs 10/22/13: CMP normal; C-peptide 0.65, increased from 0.19 at diagnosis (normal 0.80-3.90); TSH 1.265, free T4 1.01, free T3 3.4, TPO antibody 10.4    Assessment and Plan:   ASSESSMENT:  1-3. T1DM/hypoglycemia/hypoglycemia  unawareness  A. Since exiting the honeymoon period, Terrius has required more exogenous insulin than he needed before. His BGs have been much more variable over time.   B. In May 202 when he saw Dr. Vilinda [name], she made two assessments: first, that his HbA1c was too high and second, that he was taking more insulin for his weight than he should be taking. She then reduced his basal rates by an average of about 45%, but did not change his bolus settings. His BGs increased significantly as a result.  C. Dr. Ladona [name] then increased his basal rates to about half-way between Dr. Lizbeth Bark basal rates and my basal rates.  D. His HbA1c and average BG are about the same as they were at his last visit in March. Since we know that his BGs were much higher in the two weeks that he was using Dr. Lizbeth Bark basal rates, it is reasonable to suppose that the BGs have been better in the past two weeks, but are still too high. I will increased his basal rates about half-way between Dr. Lubertha Basque basal rates and my previous basal rates, but ask the mother to send in his Dexcom report in two weeks. E. Ms. Zachery Dauer is doing a very good job overall of taking care of Breckon.     4-5. Goiter/thyroiditis:   A. He has the personal history of autoimmune T1DM. He also has the family history of autoimmune thyroid disease.  B. His thyroid gland is more enlarged today. His pattern of waxing and waning of thyroid gland size and thyroid lobe sizes is c/w evolving Hashimoto's thyroiditis. It is very likely that he will become hypothyroid in the future.   C.  He was euthyroid in April 2015, in October 2016, in January 2019, in September 2019, in September 2020, and again in December 2021. He is clinically euthyroid now.   6. Adjustment reaction: Things are going pretty well overall. Mom is trying very hard to take good care of Mayra.  7. Autism/ADHD/ mental retardation: Laquincy's autism has improved somewhat in that he is more social and outgoing.  However, he remains completely dependent upon mom for his meals, shelter, clothing, recreational activities, physical activities, and T1DM care. 8. Peripheral neuropathy: His neuropathy was not evident at his last clinic visit, in December 2020, in May 2021, in December 2021, in March 2022., or today in June 2022. He says he is not having any symptoms today.   PLAN:  1. Diagnostic: We reviewed his BG and Dexcom readings. We reviewed his annual lab test results.  2. Therapeutic: Continue Small bedtime snack. Give a correction bolus before meals and a food bolus after meals.  New basal rates: MN: 1.35 -> 1.20 4 AM: 1.50 -> 1.25 8 AM: 1.15 -> 1.00 Noon: 0.95 -> 0.80 6 PM: 1.25 ->  1.10  Continue ISF: 50  Continue BG targets: MN: 180 6 AM: 150 9 PM: 180  Change ICRs: MN: 15  6 AM: 18 Noon: 18  3. Patient education: We discussed his elevated  BGs, his hypoglycemia, and all of the above at length. I again asked mom to call me if Ramona has any BGs during the night or in the mornings that are <80 and unexplained. I also asked her to keep a log of unusual high BGs or lower BGs and the causes. We discussed possibly seeking out an adult endocrinologist at [facility] for [place] in [place]. For the present, however, mom wants to bring Phyllip back to see me.   4. Follow-up: three months for an afternoon appointment.  Level of Service: This visit lasted in excess of 60 minutes. More than 50% of the visit was devoted to counseling.   [name], MD, CDE Adult and Pediatric Endocrinology

## 2021-02-06 ENCOUNTER — Telehealth (INDEPENDENT_AMBULATORY_CARE_PROVIDER_SITE_OTHER): Payer: Self-pay | Admitting: "Endocrinology

## 2021-02-06 NOTE — Telephone Encounter (Signed)
  Who's calling (name and relationship to patient) :Johnny [name] (Mother)  Best contact number: [phone] (Mobile) Provider they see:  David Stall, MD Reason for call: Parent calling to schedule dexcom download, should an appointment be made?    PRESCRIPTION REFILL ONLY  Name of prescription:  Pharmacy:

## 2021-02-08 NOTE — Telephone Encounter (Signed)
LVM with call back number.

## 2021-04-12 ENCOUNTER — Ambulatory Visit (INDEPENDENT_AMBULATORY_CARE_PROVIDER_SITE_OTHER): Payer: Medicaid Other | Admitting: "Endocrinology

## 2021-04-12 ENCOUNTER — Other Ambulatory Visit: Payer: Self-pay

## 2021-04-12 ENCOUNTER — Encounter (INDEPENDENT_AMBULATORY_CARE_PROVIDER_SITE_OTHER): Payer: Self-pay | Admitting: "Endocrinology

## 2021-04-12 VITALS — BP 114/68 | HR 76 | Ht 67.0 in | Wt 155.0 lb

## 2021-04-12 DIAGNOSIS — E10649 Type 1 diabetes mellitus with hypoglycemia without coma: Secondary | ICD-10-CM | POA: Diagnosis not present

## 2021-04-12 DIAGNOSIS — Z23 Encounter for immunization: Secondary | ICD-10-CM | POA: Diagnosis not present

## 2021-04-12 DIAGNOSIS — E049 Nontoxic goiter, unspecified: Secondary | ICD-10-CM | POA: Diagnosis not present

## 2021-04-12 DIAGNOSIS — F840 Autistic disorder: Secondary | ICD-10-CM

## 2021-04-12 DIAGNOSIS — E063 Autoimmune thyroiditis: Secondary | ICD-10-CM

## 2021-04-12 DIAGNOSIS — F4320 Adjustment disorder, unspecified: Secondary | ICD-10-CM

## 2021-04-12 DIAGNOSIS — E1065 Type 1 diabetes mellitus with hyperglycemia: Secondary | ICD-10-CM

## 2021-04-12 DIAGNOSIS — F902 Attention-deficit hyperactivity disorder, combined type: Secondary | ICD-10-CM

## 2021-04-12 DIAGNOSIS — E1042 Type 1 diabetes mellitus with diabetic polyneuropathy: Secondary | ICD-10-CM

## 2021-04-12 LAB — POCT GLYCOSYLATED HEMOGLOBIN (HGB A1C): Hemoglobin A1C: 8.7 % — AB (ref 4.0–5.6)

## 2021-04-12 LAB — POCT GLUCOSE (DEVICE FOR HOME USE): POC Glucose: 258 mg/dl — AB (ref 70–99)

## 2021-04-12 NOTE — Progress Notes (Signed)
Subjective:  Patient Name: Johnny [name] Date of Birth: 13-Jun-1996  MRN: [id number]  Johnny [name]  presents at today's clinic visit for follow-up evaluation and management  of his T1DM, hypoglycemia, autism, ADHD, possible mental retardation, goiter, thyroiditis, adjustment reaction, and family history of autoimmune thyroid disease.  HISTORY OF PRESENT ILLNESS:   Johnny [name] is a 25 y.o. Caucasian young man.   Johnny [name] was accompanied by his mother.  1. Johnny [name] was seen at [facility] in [place], [place], on 06/26/13 for complaints of weight loss, fatigue, personality change, polyuria, polydipsia, and vomiting. New-onset DM and DKA were diagnosed. He received fluids at the ED at [facility], was transferred to [facility], and was admitted to the PICU for evaluation and management of new-onset T1DM, DKA, dehydration, and ketonuria in the setting of pre-existing autism, ADHD, and possible mental retardation. Significantly abnormal lab results included: Venous pH 7.257, serum potassium 3.0, serum CO2 15, serum glucose 257, hemoglobin A1c 13.7%, C-peptide 0.19 (normal 0.80-3.09), anti-glutamic acid decarboxylase (GAD) antibody > 30 (normal < 1.0), anti-islet cell antibody 80 (normal < 5). TSH was 2.163, free T4 0.82, free T3 1.8 (normal 2.3-4.2). He was treated initially with infusions of insulin and fluids. When his DKA resolved, he was converted to a multiple daily injection (MDI) insulin plan, using Lantus as a basal insulin and Novolog aspart as a bolus insulin at mealtimes, bedtime, and 2 AM if needed. 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   2. Johnny [name] was discharged from [facility] on 07/04/13. He and his mother participated in our DSSP education program with Ms. Gearldine Bienenstock, RN. 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 When he went into the honeymoon period his Lantus dose was decreased to 6 units at  night. Since then, however, he has exited the honeymoon period and has had a gradual but progressive increase in his exogenous insulin requirement. He started his Dexcom G5 CGM in early 2016. He was converted to an Omnipod pump on 01/16/16. He was subsequently converted to a Dexcom G6.   3. Johnny [name]'s last [facility] visit occurred on 01/05/21. At that visit I decreased all of his basal rates. Mom says that change helped.  A. In the interim he has been healthy, except for covid in July 2022..  B. He still has a fine, punctate rash on his back and arms. He has not yet seen a dermatologist. His allergies continue to be better since moving to [place], [place].   C. His BGs still vary with his carb intake. If he eats more than he takes insulin for, the BGs will be higher up. Johnny [name] has not had many low BGs.   D. His Omnipod pump has had problems with the "home" button. His Dexcom G6 has been working well. Mom has continued to do FSBG testing using his Fastclix lancets. Mom sometimes has to reduce his food boluses. Although I have asked mom to give him his correction bolus before the meal and his food bolus after the meal, she continues to wait until after the meal to give his insulin boluses.   Johnny [name] has not had the covid vaccinations. Neither has mom. I encouraged both of them to get the vaccine.    G. His appetite is good. Mom still controls the number of carbs he takes in.   F. Chancy's speech continues to improve, but he still remains completely dependent on  his mother for all of his basic needs. He can, however, forage for food at home.    4. [name]:  Constitutional: Johnny [name] feels "good".   Eyes: Vision seems to be good when he wears his glasses. He had his last eye appointment in May 2019. The eye doctor did not see any signs of DM damage. He has a follow up eye appointment scheduled for later this month.  Neck: Johnny [name] has not had any pains or swelling in  the front part of his neck. There are no other recognized problems of the anterior neck.  Heart: There are no recognized heart problems. The ability to perform physical activities seems normal for his limited  level of physical activity.  Gastrointestinal: He is still hungry. He no longer has postprandial bloating. Bowel movents seem normal. There are no recognized GI problems. Hands: No problems Legs: Muscle mass and strength seem normal. He can perform his usual ADLs and other physical activities without obvious discomfort. He has no numbness, tingling, burning, or pains. No edema is noted.  Feet: His feet are dry. No edema is noted.  Neurologic: There are no recognized problems with muscle movement and strength, sensation, or coordination. Hypoglycemia: Mom says that he has had fewer low BGs.   Skin: His skin is dry.  5. BG printout: We have data from the past 2 weeks. He checks BGs 4-14 times per day, average 11.1 times. Average BG is 207, compared with 243 at his last visit and with 204 at his prior visit. BG range is 75-399, compared with 104-HI at his last visit, and with 77-432 at his prior visit. Mom has been giving his boluses after meals.   6. CGM printout: We have data from the past 2 weeks. Average SG is 217, compared with 251 at his last visit and with 227 at prior visit. SGs average about 245 at midnight, 170 at breakfast, 180 at lunch, 250 at dinner, and 260 at bedtime. Most of his lower SGs occur around 10 AM . His highest SGs occur before or after dinner. He has been having baked potatoes for snacks prior to dinner. Mom did  not realize that baked potatoes have many carbs.   PAST MEDICAL, FAMILY, AND SOCIAL HISTORY    Past Medical History:  Diagnosis Date   ADHD (attention deficit hyperactivity disorder)    Allergic rhinitis    Autism    Diabetes mellitus without complication (HCC)     Family History  Problem Relation Age of Onset   Hypothyroidism Mother    Kidney disease  Mother        Mom unsure what her kidney issues are   Autism Sister    Mental retardation Sister    Cancer Maternal Grandmother    Cancer Maternal Grandfather    Diabetes Paternal Grandmother        Died at age 38.   Other Other        Paternal great aunt with "hypoglycemia" unsure why     Current Outpatient Medications:    Accu-Chek FastClix Lancets MISC, USE AS DIRECTED TO CHECK BLOOD SUGAR 6 TIMES DAILY, Disp: 204 each, Rfl: 6   acetone, urine, test strip, 1 strip by Does not apply route as needed for high blood sugar., Disp: 200 strip, Rfl: 6   cetirizine (ZYRTEC) 10 MG tablet, Take 1 tablet (10 mg total) by mouth daily., Disp: 90 tablet, Rfl: 3   Continuous Blood Gluc Receiver (DEXCOM G6 RECEIVER) DEVI, USE  AS DIRECTED, Disp: 1 each, Rfl: 0   Continuous Blood Gluc Sensor (DEXCOM G6 SENSOR) MISC, USE AS DIRECTED CHANGE EVERY 10 DAYS, Disp: 3 each, Rfl: 5   Continuous Blood Gluc Transmit (DEXCOM G6 TRANSMITTER) MISC, USE AS DIRECTED CHANGE EVERY 90 DAYS, Disp: 1 each, Rfl: 5   insulin aspart (NOVOLOG) 100 UNIT/ML injection, administer 200 units via isulin pump EVERY 48 hours, Disp: 40 mL, Rfl: 5   Insulin Pen Needle (PEN NEEDLES) 32G X 4 MM MISC, Use with insulin pen up to 6x per day, Disp: 200 each, Rfl: 11   LANTUS SOLOSTAR 100 UNIT/ML Solostar Pen, USE UP TO 50 units DAILY in THE event pump fails, Disp: 15 mL, Rfl: 5   NOVOLOG FLEXPEN 100 UNIT/ML FlexPen, USE UP TO 50 units DAILY in THE event pump fails, Disp: 15 mL, Rfl: 5   triamcinolone (KENALOG) 0.025 % cream, Apply 1 application topically 2 (two) times daily., Disp: 240 g, Rfl: 3   diphenhydrAMINE (BENADRYL) 25 MG tablet, Take 50 mg by mouth every 6 (six) hours as needed for itching or allergies.  (Patient not taking: Reported on 04/12/2021), Disp: , Rfl:    glucagon (GLUCAGEN HYPOKIT) 1 MG SOLR injection, Use in instances of severe low glucose, or if unresponsive (Patient not taking: Reported on 04/12/2021), Disp: 2 each, Rfl: 5    Urine Glucose-Ketones Test STRP, If urine output and extreme thirst increases, collect urine and check with strip as directed. Disp 1 vial (Patient not taking: No sig reported), Disp: 20 each, Rfl: 3  Allergies as of 04/12/2021 - Review Complete 04/12/2021  Allergen Reaction Noted   Other  03/22/2020     reports that he has never smoked. He has never used smokeless tobacco. He reports that he does not drink alcohol. Pediatric History  Patient Parents   [name] (Mother)   Other Topics Concern   Not on file  Social History Narrative   Lives with Mom and Sister (17y).  Mom quit smoking 18 years ago.  She has 2 other grown children.  They have a dog at home.  No recent travel.   1. School and Family: He completed his education program. He has health insurance through [facility], but that insurance may be changing. Mom has health insurance as well. Family moved to [place]  on 05/24/19. Mom went to [facility] for care to see if she could find a doctor in [place] who would take care of Johnny [name], but was unsuccessful. I told her that I will help her find a new doctor in [place] is she can no longer bring [place] to see me. .  2. Activities: His play and video games.  3. Primary Care Provider: His new PCP is a NP named Marchelle Folks Day in [facility], [phone].  4. Psychiatrist: None at present  REVIEW OF SYSTEMS: There are no other significant problems involving Johnny [name]'s other body systems.   Objective:   BP 114/68   Pulse 76   Ht 5\' 7"  (1.702 m)   Wt 155 lb (70.3 kg)   BMI 24.28 kg/m   Growth percentile SmartLinks can only be used for patients less than 18 years old.  Wt Readings from Last 3 Encounters:  04/12/21 155 lb (70.3 kg)  01/05/21 154 lb 3.2 oz (69.9 kg)  09/29/20 160 lb 9.6 oz (72.8 kg)    Ht Readings from Last 3 Encounters:  04/12/21 5\' 7"  (1.702 m)  06/29/20 5' 7.32" (1.71 m)  06/06/20 5\' 6"  (1.676 m)  HC Readings from Last 3 Encounters:  No data  found for [facility]   Facility age limit for growth percentiles is 20 years.  Body mass index is 24.28 kg/m. Facility age limit for growth percentiles is 20 years.  Body surface area is 1.82 meters squared.   Constitutional:  Johnny [name] looks good today. His weight has increased almost 1 pound. He is bright and alert, more mature and interactive, but still significantly cognitively disabled.  Eyes: There is no arcus or proptosis.  Mouth: The oropharynx appears normal. The tongue appears normal. There is normal oral moisture. There is no obvious gingivitis. Neck: There are no bruits present. The thyroid gland appears enlarged. The thyroid gland is diffusely enlarged, but smaller, at approximately 21 grams in size. The consistency of the thyroid gland is full. There is no thyroid tenderness to palpation. Lungs: The lungs are clear. Air movement is good. Heart: The heart rhythm and rate appear normal. Heart sounds S1 and S2 are normal. I do not appreciate any pathologic heart murmurs. Abdomen: The abdominal size is mildly enlarged. Bowel sounds are normal. The abdomen is soft and non-tender. There is no obviously palpable hepatomegaly, splenomegaly, or other masses.  Arms: Muscle mass appears appropriate for age.  Hands: There is no obvious tremor. Phalangeal and metacarpophalangeal joints appear normal. Palms are normal. Legs: Muscle mass appears appropriate for age. There is no edema.  Feet: There are no significant deformities. Dorsalis pedis pulses are normal 1+bilaterally. The lateral dorsal skin is dry.  Neurologic: Muscle strength is normal for age and gender  in both the upper and the lower extremities. Muscle tone appears normal. Sensation to touch is normal in th e legs and feet. Skin: He has a very fine, punctate white-pink rash on his back and arms  LAB DATA:  Results for orders placed or performed in visit on 04/12/21 (from the past 504 hour(s))  POCT Glucose (Device for Home Use)    Collection Time: 04/12/21  2:07 PM  Result Value Ref Range   Glucose Fasting, POC     POC Glucose 258 (A) 70 - 99 mg/dl  POCT glycosylated hemoglobin (Hb A1C)   Collection Time: 04/12/21  2:16 PM  Result Value Ref Range   Hemoglobin A1C 8.7 (A) 4.0 - 5.6 %   HbA1c POC (<> result, manual entry)     HbA1c, POC (prediabetic range)     HbA1c, POC (controlled diabetic range)      LABS  Labs 04/12/21: HbA1c 8.7%, CBG 258  Labs 01/05/21: CBG 274  Labs 12/02/20: HbA1c 8.6%, CBG 348  Labs 09/29/20: HbA1c 8.6%, CBG 193  Labs 06/29/20: HbA1c 9.2%, CBG 220; TSH 1.37, free T4 1.1, free T3 3.9; CMP normal, except glucose 181 and CO2 33; urinary microalbumin/creatinine 5  Labs 04/14/19: HbA1c 8.2%, CBG 307  Labs 03/31/19: TSH 1.20, free T4 1.1, free T3 3.6; CMP normal; cholesterol 111, triglycerides 94, HDL 44, LDL 94;  microalbumin/creatinine ratio 3  Labs 08/07/18: HbA1c 8.8%, CBG 204  Labs 05/07/18: HbA1c 9.0%, CBG 171  Labs 04/16/18: TSH 2.081  Labs 04/15/18: venous pH 7.07; serum CO2 9, creatinine 1.49; Urine glucose >500 and ketones 80  Labs 02/07/18: HbA1c 10.0%  Labs 02/05/18: CBG 251  Labs 12/03/17: CBG 334  Labs 09/30/17: HbA1c 8.7%, CBG 254  Labs 07/24/17: CBG 263; TSH 2.18, free T4 1.1, free T3 3.3; urine microalbumin/creatinine ration 2; CMP normal except glucose 263  Labs 05/07/17: HbA1c 9.3%, CBG 246  Labs 03/04/17: CBG 494. He  had not bolused after breakfast due to mom putting in a new pod just before they left home to drive to his appointment today.  Labs 12/26/16: HbA1c 10.2%, CBG 289  Labs 09/25/16: HbA1c 9.5%, CBG 261  Labs 06/27/16: CBG 114; TSH 1.54, free T4 1.1, free T3 3.4; CMP normal; urine microalbumin/creatinine ratio 2; cholesterol 108, triglycerides 153, HDL 43, LDL 34  Labs 04/10/16: HbA1c 9.5%, CBG 145  Labs 02/01/16: CBG 391, TSH 1.72  Labs 12/21/15: HbA1c 8.4%  Labs 10/10/15: HbA1c 8.2%.  Labs 08/09/15: HbA1c 8.5%  Labs 1012/16: HbA1c 8.8%; TSH 2.624,  free T4 0.99, free T3 3.4; urine microalbumin/creatinine ratio <0.2; CMP normal  Labs 01/27/15: HbA1c 8.8%  Labs 10/25/14: Hemoglobin A1c 8.2%, compared with 7.9% at the last visit and with 8.4% at the prior visit.    Labs 10/22/13: CMP normal; C-peptide 0.65, increased from 0.19 at diagnosis (normal 0.80-3.90); TSH 1.265, free T4 1.01, free T3 3.4, TPO antibody 10.4    Assessment and Plan:   ASSESSMENT:  1-3. T1DM/hypoglycemia/hypoglycemia unawareness  A. Since exiting the honeymoon period, Johnny [name] has required more exogenous insulin than he needed before. His BGs have been much more variable over time.   B. In May 2022 when he saw Dr. Vilinda Blanks, she made two assessments: first, that his HbA1c was too high and second, that he was taking more insulin for his weight than he should be taking. She then reduced his basal rates by an average of about 45%, but did not change his bolus settings. His BGs increased significantly as a result.  C. Dr. Ladona Ridgel then increased his basal rates to about half-way between Dr. Lizbeth Bark basal rates and my basal rates.  D. His HbA1c and average BG are about the same as they were at his last visit in June 2022. The most significant dietary change is that he has been eating more baked potatoes in the late afternoon and early evening. I will ask the mother to increase his carb count for baked potatoes to 35 grams.  E. Ms. Zachery Dauer is doing a very good job overall of taking care of Johnny [name].     4-5. Goiter/thyroiditis:   A. He has the personal history of autoimmune T1DM. He also has the family history of autoimmune thyroid disease.  B. His thyroid gland is more enlarged today. His pattern of waxing and waning of thyroid gland size and thyroid lobe sizes is c/w evolving Hashimoto's thyroiditis. It is very likely that he will become hypothyroid in the future.   C.  He was euthyroid in April 2015, in October 2016, in January 2019, in September 2019, in September 2020, and again in  December 2021. He is clinically euthyroid now.   6. Adjustment reaction: Things are going pretty well overall. Mom is trying very hard to take good care of Johnny [name].  7. Autism/ADHD/intellectual disability: Geral's autism has improved somewhat in that he is more social and outgoing. However, he remains completely dependent upon mom for his meals, shelter, clothing, recreational activities, physical activities, and T1DM care. 8. Peripheral neuropathy: His neuropathy was not evident at his last clinic visit, in December 2020, in May 2021, in December 2021, in March 2022., in June 2022, or today in September 2022. He says he is not having any symptoms today.   PLAN:  1. Diagnostic: We reviewed his BG and Dexcom readings. We reviewed his annual lab test results from December 2021. We will repeat the lab tests in December.  2. Therapeutic: Continue Small  bedtime snack. Give a correction bolus before meals and a food bolus after meals. Continue his current basal rates:  MN: 1.20 4 AM: 1.25 8 AM: 1.00 Noon: 0.80 6 PM: 1.10  Continue ISF: 50  Continue BG targets: MN: 180 6 AM: 150 9 PM: 180  Change ICRs: MN: 15  6 AM: 18 Noon: 18  3. Patient education: We discussed his elevated BGs, his hypoglycemia, and all of the above at length. I again asked mom to call me if Dewey has any BGs during the night or in the mornings that are <80 and unexplained. I also asked her to keep a log of unusual high BGs or lower BGs and the causes. We discussed possibly seeking out an adult endocrinologist at [facility] in [place]. For the present, however, mom wants to bring Demoni back to see me.   4. Follow-up: three months for an afternoon appointment.  Level of Service: This visit lasted in excess of 65 minutes. More than 50% of the visit was devoted to counseling.   [name], MD, CDE Adult and Pediatric Endocrinology

## 2021-04-12 NOTE — Patient Instructions (Signed)
Follow up visit in 3 months.   At [facility], we are committed to providing exceptional care. You will receive a patient satisfaction survey through text or email regarding your visit today. Your opinion is important to me. Comments are appreciated.   

## 2021-04-27 ENCOUNTER — Telehealth (INDEPENDENT_AMBULATORY_CARE_PROVIDER_SITE_OTHER): Payer: Self-pay | Admitting: "Endocrinology

## 2021-04-27 NOTE — Telephone Encounter (Signed)
  Who's calling (name and relationship to patient) : Victorino Dike Lick(Healthcare Services)  Best contact number: [phone]  Provider they see: Dr. Fransico Michael  Reason for call: Stated that the new Omipod needs to be processed, needs Medical Ass PDM Stated she has faxed over info of a minimum of 4x Requested A call Back Immediately.Marland Kitchen     PRESCRIPTION REFILL ONLY  Name of prescription:  Pharmacy:

## 2021-04-28 NOTE — Telephone Encounter (Signed)
Faxed everything requested. Spoke with Johnny [name] to let her know that the fax is coming through. She is also faxing over a state form to be signed. I let her know that Dr Fransico Michael is out of the office and another provider is signing the paperwork. She stated that its fine.

## 2021-05-22 ENCOUNTER — Other Ambulatory Visit (INDEPENDENT_AMBULATORY_CARE_PROVIDER_SITE_OTHER): Payer: Self-pay | Admitting: "Endocrinology

## 2021-07-12 ENCOUNTER — Ambulatory Visit (INDEPENDENT_AMBULATORY_CARE_PROVIDER_SITE_OTHER): Payer: Medicaid Other | Admitting: "Endocrinology

## 2021-07-19 NOTE — Progress Notes (Signed)
Subjective:  Patient Name: Johnny [name] Date of Birth: [birth year]  MRN: [id number]  Johnny [name]  presents at today's clinic visit for follow-up evaluation and management  of his T1DM, hypoglycemia, autism, ADHD, possible mental retardation, goiter, thyroiditis, adjustment reaction, and family history of autoimmune thyroid disease.  HISTORY OF PRESENT ILLNESS:   Johnny [name] is a 25 y.o. Caucasian young man.   Johnny [name] was accompanied by his mother.  1. Johnny [name] was seen at [facility] in [place], [place], on 06/26/13 for complaints of weight loss, fatigue, personality change, polyuria, polydipsia, and vomiting. New-onset DM and DKA were diagnosed. He received fluids at the ED at [facility], was transferred to [facility], and was admitted to the PICU for evaluation and management of new-onset T1DM, DKA, dehydration, and ketonuria in the setting of pre-existing autism, ADHD, and possible mental retardation. Significantly abnormal lab results included: Venous pH 7.257, serum potassium 3.0, serum CO2 15, serum glucose 257, hemoglobin A1c 13.7%, C-peptide 0.19 (normal 0.80-3.09), anti-glutamic acid decarboxylase (GAD) antibody > 30 (normal < 1.0), anti-islet cell antibody 80 (normal < 5). TSH was 2.163, free T4 0.82, free T3 1.8 (normal 2.3-4.2). He was treated initially with infusions of insulin and fluids. When his DKA resolved, he was converted to a multiple daily injection (MDI) insulin plan, using Lantus as a basal insulin and Novolog aspart as a bolus insulin at mealtimes, bedtime, and 2 AM if needed. 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   2. Clinical course: A. Johnny [name] was discharged from [facility] on 07/04/13. He and his mother participated in our DSSP education program with Ms. Gearldine Bienenstock, RN. 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 When he went into the honeymoon period his Lantus dose was  decreased to 6 units at night. Since then, however, he has exited the honeymoon period and has had a gradual but progressive increase in his exogenous insulin requirement. He started his Dexcom G5 CGM in early 2016. He was converted to an Omnipod pump on 01/16/16. He was subsequently converted to a Dexcom G6.  B. Burdette had covid in July 2022. He has fully recovered.   3. [name] occurred on 04/12/21. At that visit I continued all of his pump settings.   A. In the interim he has been healthy. He has fully recovered from the covid infection he had in July 2022.   B. He still has a fine, punctate rash on his neck, back and arms. He has not yet seen a dermatologist. His allergies continue to be better since moving to [place], [place].   C. His BGs may be a bit lower, but still vary with his carb intake. If he eats more than he takes insulin for, the BGs will be higher up. Johnny [name] has not had many low BGs. Mom still adjusts his boluses to try to compensate for planned physical activity ands to avoid causing hypoglycemia.    D. His Omnipod pump is working well. He also has a new Omnipod pump that has not been set up yet. His Dexcom G6 has been working well. Mom has continued to do FSBG testing using his Fastclix lancets. He needs more lancets. Mom sometimes has to reduce his food boluses. Although I have asked mom to give him his correction bolus before the meal and his food bolus after the meal, she continues to wait until after the meal to  give his insulin boluses.   Johnny [name] has still not had the covid vaccinations. Neither has mom. I encouraged both of them to get the vaccine.    G. His appetite is good. Mom still controls the number of carbs he takes in.   F. Johnny [name]'s speech continues to improve, but he still remains completely dependent on his mother for all of his basic needs. He can, however, forage for food at home.    4. [name]:   Constitutional: Johnny [name] feels "good, two thumbs up".   Eyes: Vision seems to be good when he wears his glasses. He had his last eye appointment in the Spring of 2022.. The eye doctor did not see any signs of DM damage. He has a follow up eye appointment scheduled in the future.  Neck: Johnny [name] has not had any pains or swelling in the front part of his neck. There are no other recognized problems of the anterior neck.  Heart: There are no recognized heart problems. The ability to perform physical activities seems normal for his limited  level of physical activity.  Gastrointestinal: He is still hungry. He no longer has postprandial bloating. Bowel movents seem normal. There are no recognized GI problems. Hands: No problems Legs: Muscle mass and strength seem normal. He can perform his usual ADLs and other physical activities without obvious discomfort. He has no numbness, tingling, burning, or pains. No edema is noted.  Feet: His feet are dry. No edema is noted.  Neurologic: There are no recognized problems with muscle movement and strength, sensation, or coordination. Hypoglycemia: Mom says that he has had fewer low BGs.   Skin: His skin is dry.  5. BG printout: We have data from the past 2 weeks. Average BG is 207, compared with 243 at his last visit and with 204 at his prior visit. BG range is 66-432, compared with 75-399 at his last visit and with 104-HI at his prior visit. Mom has still been giving his boluses after meals.   6. CGM printout: We have data from the past 2 weeks. Average SG is 225, compared with 207 at his last visit and with 217 at prior visit. SGs average about 250 at midnight, 185 at breakfast, 180 at lunch, 230 at dinner, and 250 at bedtime. Most of his lower SGs occur around 9 AM and 8 PM. His highest SGs occur in the afternoons and in the early morning hours.    PAST MEDICAL, FAMILY, AND SOCIAL HISTORY    Past Medical History:  Diagnosis Date   ADHD (attention deficit  hyperactivity disorder)    Allergic rhinitis    Autism    Diabetes mellitus without complication (HCC)     Family History  Problem Relation Age of Onset   Hypothyroidism Mother    Kidney disease Mother        Mom unsure what her kidney issues are   Autism Sister    Mental retardation Sister    Cancer Maternal Grandmother    Cancer Maternal Grandfather    Diabetes Paternal Grandmother        Died at age 41.   Other Other        Paternal great aunt with "hypoglycemia" unsure why     Current Outpatient Medications:    Continuous Blood Gluc Receiver (DEXCOM G6 RECEIVER) DEVI, USE AS DIRECTED, Disp: 1 each, Rfl: 0   Continuous Blood Gluc Sensor (DEXCOM G6 SENSOR) MISC, USE AS DIRECTED CHANGE EVERY 10 DAYS, Disp:  3 each, Rfl: 5   Continuous Blood Gluc Transmit (DEXCOM G6 TRANSMITTER) MISC, USE AS DIRECTED CHANGE EVERY 90 DAYS, Disp: 1 each, Rfl: 5   insulin aspart (NOVOLOG) 100 UNIT/ML injection, administer 200 units via isulin pump EVERY 48 hours, Disp: 40 mL, Rfl: 5   Insulin Pen Needle (PEN NEEDLES) 32G X 4 MM MISC, Use with insulin pen up to 6x per day, Disp: 200 each, Rfl: 11   LANTUS SOLOSTAR 100 UNIT/ML Solostar Pen, USE UP TO 50 units DAILY in THE event pump fails, Disp: 15 mL, Rfl: 5   NOVOLOG FLEXPEN 100 UNIT/ML FlexPen, USE UP TO 50 units DAILY in THE event pump fails, Disp: 15 mL, Rfl: 5   Accu-Chek FastClix Lancets MISC, USE AS DIRECTED TO CHECK BLOOD SUGAR 6 TIMES DAILY (Patient not taking: Reported on 07/20/2021), Disp: 204 each, Rfl: 6   acetone, urine, test strip, 1 strip by Does not apply route as needed for high blood sugar. (Patient not taking: Reported on 07/20/2021), Disp: 200 strip, Rfl: 6   cetirizine (ZYRTEC) 10 MG tablet, Take 1 tablet (10 mg total) by mouth daily. (Patient not taking: Reported on 07/20/2021), Disp: 90 tablet, Rfl: 3   diphenhydrAMINE (BENADRYL) 25 MG tablet, Take 50 mg by mouth every 6 (six) hours as needed for itching or allergies.  (Patient not  taking: Reported on 04/12/2021), Disp: , Rfl:    glucagon (GLUCAGEN HYPOKIT) 1 MG SOLR injection, Use in instances of severe low glucose, or if unresponsive (Patient not taking: Reported on 04/12/2021), Disp: 2 each, Rfl: 5   triamcinolone (KENALOG) 0.025 % cream, Apply 1 application topically 2 (two) times daily. (Patient not taking: Reported on 07/20/2021), Disp: 240 g, Rfl: 3   Urine Glucose-Ketones Test STRP, If urine output and extreme thirst increases, collect urine and check with strip as directed. Disp 1 vial (Patient not taking: Reported on 06/29/2020), Disp: 20 each, Rfl: 3  Allergies as of 07/20/2021 - Review Complete 07/20/2021  Allergen Reaction Noted   Other  03/22/2020     reports that he has never smoked. He has never used smokeless tobacco. He reports that he does not drink alcohol. Pediatric History  Patient Parents   [name] (Mother)   Other Topics Concern   Not on file  Social History Narrative   Lives with Mom and Sister (17y).  Mom quit smoking 18 years ago.  She has 2 other grown children.  They have a dog at home.  No recent travel.   1. School and Family: He completed his education program. He has health insurance through [facility], but that insurance may be changing. Mom has health insurance as well. Family moved to [place] on 05/24/19. Mom went to [facility] for care to see if she could find a doctor in [place] who would take care of Johnny [name], but was unsuccessful. I told her that I will help her find a new doctor in [place] if she can no longer bring Saman to see me. .  2. Activities: His play and video games.  3. Primary Care Provider: His PCP site is [facility], [phone].  4. Psychiatrist: None at present  REVIEW OF SYSTEMS: There are no other significant problems involving [name]'s other body systems.   Objective:   BP 100/62 (BP Location: Right Arm, Patient Position: Sitting, Cuff Size: Normal)    Pulse 98    Wt 157 lb 9.6 oz (71.5  kg)    BMI 24.68 kg/m   Growth percentile  SmartLinks can only be used for patients less than 23 years old.  Wt Readings from Last 3 Encounters:  07/20/21 157 lb 9.6 oz (71.5 kg)  04/12/21 155 lb (70.3 kg)  01/05/21 154 lb 3.2 oz (69.9 kg)    Ht Readings from Last 3 Encounters:  04/12/21  (1.702 m)  06/29/20 5' 7.32" (1.71 m)  06/06/20  (1.676 m)    HC Readings from Last 3 Encounters:  No data found for [facility]   Facility age limit for growth percentiles is 20 years.  Body mass index is 24.68 kg/m. Facility age limit for growth percentiles is 20 years.  Body surface area is 1.84 meters squared.   Constitutional:  Johnny [name] looks good today. His weight has increased 2-1/2 pounds. He is bright and alert, more mature and interactive, very pleasant and friendly, but still significantly cognitively disabled.  Eyes: There is no arcus or proptosis.  Mouth: The oropharynx appears normal. The tongue appears normal. There is normal oral moisture. There is no obvious gingivitis. Neck: There are no bruits present. The thyroid gland appears enlarged. The thyroid gland is again diffusely enlarged at approximately 21 grams in size. The consistency of the thyroid gland is full. There is no thyroid tenderness to palpation. Lungs: The lungs are clear. Air movement is good. Heart: The heart rhythm and rate appear normal. Heart sounds S1 and S2 are normal. I do not appreciate any pathologic heart murmurs. Abdomen: The abdomen is mildly enlarged. Bowel sounds are normal. The abdomen is soft and non-tender. There is no obviously palpable hepatomegaly, splenomegaly, or other masses.  Arms: Muscle mass appears appropriate for age.  Hands: There is no obvious tremor. Phalangeal and metacarpophalangeal joints appear normal. Palms are normal. Legs: Muscle mass appears appropriate for age. There is no edema.  Feet: There are no significant deformities. Dorsalis pedis pulses are normal 1+bilaterally. The  lateral dorsal skin is dry.  Neurologic: Muscle strength is normal for age and gender  in both the upper and the lower extremities. Muscle tone appears normal. Sensation to touch is normal in th e legs and feet. Skin: He has a very fine, punctate white-pink rash on his back and arms  LAB DATA:  Results for orders placed or performed in visit on 07/20/21 (from the past 504 hour(s))  POCT Glucose (Device for Home Use)   Collection Time: 07/20/21  2:07 PM  Result Value Ref Range   Glucose Fasting, POC     POC Glucose 242 (A) 70 - 99 mg/dl  POCT glycosylated hemoglobin (Hb A1C)   Collection Time: 07/20/21  2:15 PM  Result Value Ref Range   Hemoglobin A1C 8.7 (A) 4.0 - 5.6 %   HbA1c POC (<> result, manual entry)     HbA1c, POC (prediabetic range)     HbA1c, POC (controlled diabetic range)       LABS  Labs 07/20/21: HbA1c 8.7%, CBG 242  Labs 04/12/21: HbA1c 8.7%, CBG 258  Labs 01/05/21: CBG 274  Labs 12/02/20: HbA1c 8.6%, CBG 348  Labs 09/29/20: HbA1c 8.6%, CBG 193  Labs 06/29/20: HbA1c 9.2%, CBG 220; TSH 1.37, free T4 1.1, free T3 3.9; CMP normal, except glucose 181 and CO2 33; urinary microalbumin/creatinine 5  Labs 04/14/19: HbA1c 8.2%, CBG 307  Labs 03/31/19: TSH 1.20, free T4 1.1, free T3 3.6; CMP normal; cholesterol 111, triglycerides 94, HDL 44, LDL 94;  microalbumin/creatinine ratio 3  Labs 08/07/18: HbA1c 8.8%, CBG 204  Labs 05/07/18: HbA1c 9.0%, CBG 171  Labs 04/16/18: TSH 2.081  Labs 04/15/18: venous pH 7.07; serum CO2 9, creatinine 1.49; Urine glucose >500 and ketones 80  Labs 02/07/18: HbA1c 10.0%  Labs 02/05/18: CBG 251  Labs 12/03/17: CBG 334  Labs 09/30/17: HbA1c 8.7%, CBG 254  Labs 07/24/17: CBG 263; TSH 2.18, free T4 1.1, free T3 3.3; urine microalbumin/creatinine ration 2; CMP normal except glucose 263  Labs 05/07/17: HbA1c 9.3%, CBG 246  Labs 03/04/17: CBG 494. He had not bolused after breakfast due to mom putting in a new pod just before they left home to  drive to his appointment today.  Labs 12/26/16: HbA1c 10.2%, CBG 289  Labs 09/25/16: HbA1c 9.5%, CBG 261  Labs 06/27/16: CBG 114; TSH 1.54, free T4 1.1, free T3 3.4; CMP normal; urine microalbumin/creatinine ratio 2; cholesterol 108, triglycerides 153, HDL 43, LDL 34  Labs 04/10/16: HbA1c 9.5%, CBG 145  Labs 02/01/16: CBG 391, TSH 1.72  Labs 12/21/15: HbA1c 8.4%  Labs 10/10/15: HbA1c 8.2%.  Labs 08/09/15: HbA1c 8.5%  Labs 1012/16: HbA1c 8.8%; TSH 2.624, free T4 0.99, free T3 3.4; urine microalbumin/creatinine ratio <0.2; CMP normal  Labs 01/27/15: HbA1c 8.8%  Labs 10/25/14: Hemoglobin A1c 8.2%, compared with 7.9% at the last visit and with 8.4% at the prior visit.    Labs 10/22/13: CMP normal; C-peptide 0.65, increased from 0.19 at diagnosis (normal 0.80-3.90); TSH 1.265, free T4 1.01, free T3 3.4, TPO antibody 10.4    Assessment and Plan:   ASSESSMENT:  1-3. T1DM/hypoglycemia/hypoglycemia unawareness  A. Since exiting the honeymoon period, Hamdan has required more exogenous insulin than he needed before. His BGs have been much more variable over time.   B. In May 2022 when he saw Dr. Vilinda Blanks, she made two assessments: first, that his HbA1c was too high and second, that he was taking more insulin for his weight than he should be taking. She then reduced his basal rates by an average of about 45%, but did not change his bolus settings. His BGs increased significantly as a result.  C. Dr. Ladona Ridgel then increased his basal rates to about half-way between Dr. Lizbeth Bark basal rates and my basal rates.  D. His HbA1c is about the same, average BG is higher, and average SG is lower than they were at his last visit in September 2022. Mom is still very afraid of Laden having low BGs, so she still tends to wait until after meals to give insulin and often tends to underdose insulin if she worries that his BGs might drop. He certainly needs a higher basal rate in the evenings and early mornings. E. Ms.  Zachery Dauer is doing a very good job overall of taking care of Boyde.     4-5. Goiter/thyroiditis:   A. He has the personal history of autoimmune T1DM. He also has the family history of autoimmune thyroid disease.  B. His thyroid gland is more enlarged today. His pattern of waxing and waning of thyroid gland size and thyroid lobe sizes is c/w evolving Hashimoto's thyroiditis. It is very likely that he will become hypothyroid in the future.   C.  He was euthyroid in April 2015, in October 2016, in January 2019, in September 2019, in September 2020, and again in December 2021. He is clinically euthyroid now.   6. Adjustment reaction: Things are going pretty well overall. Mom is trying very hard to take good care of Johnny [name].  7. Autism/ADHD/intellectual disability: Johnny [name]'s autism has improved somewhat in that he is more social and outgoing. However, he  remains completely dependent upon mom for his meals, shelter, clothing, recreational activities, physical activities, and T1DM care. 8. Peripheral neuropathy: His neuropathy was not evident at his last clinic visit, in December 2020, in May 2021, in December 2021, in March 2022, in June 2022, in September 2022, or today in December 2022. He says he is not having any symptoms today.   PLAN:  1. Diagnostic: We reviewed his BG and Dexcom readings. I ordered annual lab tests.   2. Therapeutic: Continue the Small bedtime snack. Give a correction bolus before meals and a food bolus after meals. Increase his basal rates:  MN: 1.20 -> 1.30 4 AM: 1.25 -> 1.15 8 AM: 1.00  Noon: 0.80 -> 0.90 6 PM: 1.10 -> 1.20  Continue ISF: 50  Continue BG targets: MN: 180 6 AM: 150 9 PM: 180  Change ICRs: MN: 15  6 AM: 18 Noon: 18  3. Patient education: We discussed his elevated BGs, his hypoglycemia, and all of the above at length. I again asked mom to call me if Dennie has any BGs during the night or in the mornings that are <80 and unexplained. I also asked her to  keep a log of unusual high BGs or lower BGs and the causes. We discussed possibly seeking out an adult endocrinologist at [facility] for [place] in [place]. For the present, however, mom wants to bring Lee back to see me.   4. Follow-up: three months for an afternoon appointment.  Level of Service: This visit lasted in excess of 70 minutes. More than 50% of the visit was devoted to counseling.   [name], MD, CDE Adult and Pediatric Endocrinology

## 2021-07-20 ENCOUNTER — Ambulatory Visit (INDEPENDENT_AMBULATORY_CARE_PROVIDER_SITE_OTHER): Payer: Medicaid Other | Admitting: "Endocrinology

## 2021-07-20 ENCOUNTER — Encounter (INDEPENDENT_AMBULATORY_CARE_PROVIDER_SITE_OTHER): Payer: Self-pay | Admitting: "Endocrinology

## 2021-07-20 ENCOUNTER — Other Ambulatory Visit (INDEPENDENT_AMBULATORY_CARE_PROVIDER_SITE_OTHER): Payer: Self-pay

## 2021-07-20 ENCOUNTER — Other Ambulatory Visit: Payer: Self-pay

## 2021-07-20 VITALS — BP 100/62 | HR 98 | Wt 157.6 lb

## 2021-07-20 DIAGNOSIS — E063 Autoimmune thyroiditis: Secondary | ICD-10-CM | POA: Diagnosis not present

## 2021-07-20 DIAGNOSIS — E1065 Type 1 diabetes mellitus with hyperglycemia: Secondary | ICD-10-CM

## 2021-07-20 DIAGNOSIS — E1042 Type 1 diabetes mellitus with diabetic polyneuropathy: Secondary | ICD-10-CM

## 2021-07-20 DIAGNOSIS — E1010 Type 1 diabetes mellitus with ketoacidosis without coma: Secondary | ICD-10-CM

## 2021-07-20 DIAGNOSIS — E049 Nontoxic goiter, unspecified: Secondary | ICD-10-CM

## 2021-07-20 DIAGNOSIS — E875 Hyperkalemia: Secondary | ICD-10-CM

## 2021-07-20 DIAGNOSIS — E10649 Type 1 diabetes mellitus with hypoglycemia without coma: Secondary | ICD-10-CM

## 2021-07-20 DIAGNOSIS — F840 Autistic disorder: Secondary | ICD-10-CM

## 2021-07-20 DIAGNOSIS — F4320 Adjustment disorder, unspecified: Secondary | ICD-10-CM

## 2021-07-20 LAB — POCT GLUCOSE (DEVICE FOR HOME USE): POC Glucose: 242 mg/dl — AB (ref 70–99)

## 2021-07-20 LAB — POCT GLYCOSYLATED HEMOGLOBIN (HGB A1C): Hemoglobin A1C: 8.7 % — AB (ref 4.0–5.6)

## 2021-07-20 MED ORDER — ACCU-CHEK FASTCLIX LANCETS MISC: 5 refills | Status: DC

## 2021-07-20 NOTE — Patient Instructions (Signed)
Follow up visit in 3 months. 

## 2021-07-21 LAB — LIPID PANEL
Cholesterol: 116 mg/dL (ref <200)
HDL: 44 mg/dL (ref >=40)
LDL Cholesterol (Calc): 53 mg/dL (calc)
Non-HDL Cholesterol (Calc): 72 mg/dL (calc) (ref <130)
Total CHOL/HDL Ratio: 2.6 (calc) (ref <5.0)
Triglycerides: 100 mg/dL (ref <150)

## 2021-07-21 LAB — MICROALBUMIN / CREATININE URINE RATIO
Creatinine, Urine: 86 mg/dL (ref 20–320)
Microalb Creat Ratio: 3 mcg/mg creat (ref <30)
Microalb, Ur: 0.3 mg/dL

## 2021-07-21 LAB — COMPREHENSIVE METABOLIC PANEL
AG Ratio: 1.8 (calc) (ref 1.0–2.5)
ALT: 24 U/L (ref 9–46)
AST: 17 U/L (ref 10–40)
Albumin: 4.4 g/dL (ref 3.6–5.1)
Alkaline phosphatase (APISO): 65 U/L (ref 36–130)
BUN: 14 mg/dL (ref 7–25)
CO2: 29 mmol/L (ref 20–32)
Calcium: 9 mg/dL (ref 8.6–10.3)
Chloride: 100 mmol/L (ref 98–110)
Creat: 0.88 mg/dL (ref 0.60–1.24)
Globulin: 2.5 g/dL (calc) (ref 1.9–3.7)
Glucose, Bld: 175 mg/dL — ABNORMAL HIGH (ref 65–139)
Potassium: 4.2 mmol/L (ref 3.5–5.3)
Sodium: 138 mmol/L (ref 135–146)
Total Bilirubin: 1.1 mg/dL (ref 0.2–1.2)
Total Protein: 6.9 g/dL (ref 6.1–8.1)

## 2021-07-21 LAB — T3, FREE: T3, Free: 3.7 pg/mL (ref 2.3–4.2)

## 2021-07-21 LAB — TSH: TSH: 1.86 mIU/L (ref 0.40–4.50)

## 2021-07-21 LAB — T4, FREE: Free T4: 1.1 ng/dL (ref 0.8–1.8)

## 2021-07-28 ENCOUNTER — Telehealth (INDEPENDENT_AMBULATORY_CARE_PROVIDER_SITE_OTHER): Payer: Self-pay

## 2021-07-28 NOTE — Telephone Encounter (Signed)
Faxed [place] request for prior approval CMN/PA

## 2021-08-22 ENCOUNTER — Telehealth (INDEPENDENT_AMBULATORY_CARE_PROVIDER_SITE_OTHER): Payer: Self-pay | Admitting: "Endocrinology

## 2021-08-22 NOTE — Telephone Encounter (Signed)
°  Who's calling (name and relationship to patient) :[facility]   Best contact [phone]  Provider they see:Dr. Fransico Michael   Reason for call:Pharmaacy called needing orders sent for 9 strips instead of 6. Pharmacy stated that per mom he no longer tests 6 times anymore it is now 9. They asked if it can be faxed with initials and date once revised. Please fax to the number below. Fax# [phone]     PRESCRIPTION REFILL ONLY  Name of prescription:  Pharmacy:

## 2021-08-22 NOTE — Telephone Encounter (Signed)
Paperwork faxed °

## 2021-08-30 NOTE — Telephone Encounter (Signed)
Chris from [place] states that they did not receive the [place] prior authorization form with the rest of the paperwork. Call back number is [phone] option 8

## 2021-08-31 NOTE — Telephone Encounter (Signed)
Faxed

## 2021-09-15 NOTE — Telephone Encounter (Signed)
Johnny [name] from [place] is requesting call back regarding prior authorization. Call back number is [phone] option 8

## 2021-09-20 NOTE — Telephone Encounter (Signed)
?  Who's calling (name and relationship to patient) :[facility]  ? ?Best contact number:[phone]  ? ?Provider they see:Dr. Tobe Sos  ? ?Reason for call:Called to follow up on PA for the test strips. Please fax to [phone]  ? ? ? ? ?PRESCRIPTION REFILL ONLY ? ?Name of prescription: ? ?Pharmacy: ? ? ?

## 2021-09-22 ENCOUNTER — Telehealth (INDEPENDENT_AMBULATORY_CARE_PROVIDER_SITE_OTHER): Payer: Self-pay | Admitting: "Endocrinology

## 2021-09-22 NOTE — Telephone Encounter (Signed)
?  Who's calling (name and relationship to patient) : [facility]  ? ?Best contact number: ?[phone], opt [phone] ? ?Provider they see: ?Dr. Tobe Sos ? ?Reason for call: ?Chris left VM of returning a call from the office and stated that a form needed to be refaxed. He she is not sure which form that would be. But insurance is asking for medical necessity. ? ? ? ?PRESCRIPTION REFILL ONLY ? ?Name of prescription: ? ?Pharmacy: ? ? ?

## 2021-09-22 NOTE — Telephone Encounter (Signed)
?  Who's calling (name and relationship to patient) :[facility] ? ?Best contact number: ?[phone] ? ?Provider they see: ?Dr. Fransico Michael ? ?Reason for call: ?Chris left vm stating that pt is needing a P.A. for Dexcom G6 Sensor. Also stated that he has one left and Jona will be out before the next appt date.  The renewal needs to be submitted by fax.  ? ? ?PRESCRIPTION REFILL ONLY ? ?Name of prescription: ? ?Pharmacy: ? ? ?

## 2021-09-22 NOTE — Telephone Encounter (Signed)
Spoke with Johnny [name]. [place] paper has been sent a few times. I asked for a clean paper be sent and ill have it signed and returned.  ?

## 2021-09-26 NOTE — Telephone Encounter (Signed)
Called the pharmacy to let them know. Sensors went through  ?

## 2021-10-09 ENCOUNTER — Telehealth (INDEPENDENT_AMBULATORY_CARE_PROVIDER_SITE_OTHER): Payer: Self-pay | Admitting: "Endocrinology

## 2021-10-09 DIAGNOSIS — E1065 Type 1 diabetes mellitus with hyperglycemia: Secondary | ICD-10-CM

## 2021-10-09 NOTE — Telephone Encounter (Addendum)
Checked fax boxes, no paperwork found.  Attempted to call mom to see about sending the script to a local pharmacy til we can get the paperwork from [place] to complete, left HIPAA approved voicemail for return phone call.  ?

## 2021-10-09 NOTE — Telephone Encounter (Signed)
?  Who's calling (name and relationship to patient) : ? [name] Mother  ?Best contact [phone]  ? ?Provider they see:Dr. Fransico Michael  ? ?Reason for call:[facility] cancelled [facility] order of test strips, stating that they need a letter from dr. Fransico Michael. Mom needs strips for patient asap  ? ? ? ? ?PRESCRIPTION REFILL ONLY ? ?Name of prescription: ? ?Pharmacy: ? ? ?

## 2021-10-10 MED ORDER — ACCU-CHEK GUIDE ME W/DEVICE KIT: PACK | 1 refills | Status: AC

## 2021-10-10 MED ORDER — ACCU-CHEK GUIDE VI STRP: ORAL_STRIP | 5 refills | Status: DC

## 2021-10-10 NOTE — Telephone Encounter (Signed)
Sent in script for Accu chek guide test strips and meter.  Called mom to update.  She was thankful.  I told her I will have Dr. Juluis Mire MA follow up on the script for [facility].  ?

## 2021-10-10 NOTE — Telephone Encounter (Signed)
?  Name of who is calling: [name]  ? ?Caller's Relationship to Patient: mother  ? ?Best contact number:[phone] ? ?Provider they see:Dr. Fransico Michael  ? ?Reason for call: Mom states its is ok for the script to be sent to pharmacy until form comes from [facility]  ? ? ? ? ?PRESCRIPTION REFILL ONLY ? ?Name of prescription: ? ?Pharmacy: ?[facility] in [place]  ? ?

## 2021-10-10 NOTE — Addendum Note (Signed)
Addended by: Angelene Giovanni A on: 10/10/2021 04:42 PM ? ? Modules accepted: Orders ? ?

## 2021-10-11 ENCOUNTER — Telehealth (INDEPENDENT_AMBULATORY_CARE_PROVIDER_SITE_OTHER): Payer: Self-pay | Admitting: "Endocrinology

## 2021-10-11 ENCOUNTER — Telehealth (INDEPENDENT_AMBULATORY_CARE_PROVIDER_SITE_OTHER): Payer: Self-pay | Admitting: Pediatric Endocrinology

## 2021-10-11 NOTE — Telephone Encounter (Signed)
A user error has taken place: encounter opened in error, closed for administrative reasons.

## 2021-10-11 NOTE — Telephone Encounter (Signed)
?  Name of who is calling: ?[facility] ? ?Caller's Relationship to Patient: ? ?Best contact number: ?[phone] option [phone] ? ?Provider they see: ?Dr. Fransico Michael ?  ?Reason for call: ?Thayer Ohm is calling in regarding Test strips ? ? ?PRESCRIPTION REFILL ONLY ? ?Name of prescription: ? ?Pharmacy: ? ? ?

## 2021-10-12 NOTE — Telephone Encounter (Signed)
Spoke with Johnny [name]. They havent sent the patient any supplies in over a year for his Dexcom. He gets 200 strips a month from [place]. Insurance wont pay for more. I called mom to get clarification on what Jarred is using, LVM with call back number.  ?

## 2021-10-16 NOTE — Telephone Encounter (Signed)
Johnny [name] was calling back to follow up on process.  ?

## 2021-10-16 NOTE — Progress Notes (Signed)
? ? Subjective:  ?Patient Name: Johnny [name] Date of Birth: 1995/10/23  MRN: [id number] ? 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 ? ?HISTORY OF PRESENT ILLNESS:  ? ?Johnny [name] is a 26 y.o. Caucasian young man.  ? ?Johnny [name] was accompanied by his mother and sister, Nira Conn. ? ?1. Johnny [name] was seen at [facility] in [place], [place], on 06/26/13 for complaints of weight loss, fatigue, personality change, polyuria, polydipsia, and vomiting. New-onset DM and DKA were diagnosed. He received fluids at the ED at [facility], was transferred to [facility], and was admitted to the PICU for evaluation and management of new-onset T1DM, DKA, dehydration, and ketonuria in the setting of pre-existing autism, ADHD, and possible mental retardation. Significantly abnormal lab results included: Venous pH 7.257, serum potassium 3.0, serum CO2 15, serum glucose 257, hemoglobin A1c 13.7%, C-peptide 0.19 (normal 0.80-3.09), anti-glutamic acid decarboxylase (GAD) antibody > 30 (normal < 1.0), anti-islet cell antibody 80 (normal < 5). TSH was 2.163, free T4 0.82, free T3 1.8 (normal 2.3-4.2). He was treated initially with infusions of insulin and fluids. When his DKA resolved, he was converted to a multiple daily injection (MDI) insulin plan, using Lantus as a basal insulin and Novolog aspart as a bolus insulin at mealtimes, bedtime, and 2 AM if needed. 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  ? ?2. Clinical course: ?A. Johnny [name] was discharged from [facility] on 07/04/13. He and his mother participated in our [place] education program with Ms. [name] Eaton, RN. 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 When he went into the honeymoon period his  Lantus dose was decreased to 6 units at night. Since then, however, he has exited the honeymoon period and has had a gradual but progressive increase in his exogenous insulin requirement. He started his Dexcom G5 CGM in early 2016. He was converted to an Omnipod pump on 01/16/16. He was subsequently converted to a Dexcom G6.  ?B. Johnny [name] had covid in July 2022. He has fully recovered.  ? ?3. [name] occurred on 07/20/21. At that visit I changed several basal rate settings. Those changes helped initially.  ? A. In the interim he has been healthy. His allergies continue to be better since moving to [place], [place].  ?B. He still has a fine, punctate rash on his neck, back and arms. He has not yet seen a dermatologist.  ? C. BG discussion: ?1). He had been eating a series of prepared meals that were delivered to the house and that he ate about 6 PM. As a result, his BGs were pretty good. However, he was bored with those meals, so mom has been preparing meals at home later in the evenings and he has also been eating ice cream sandwiches as dessert after dinner. His BGs have usually been higher later at night.  ?2). The remainder of his BGs have been lower, but still vary with his carb intake.  ?3). Johnny [name] has not had many low BGs. Mom still adjusts his boluses to try to compensate for planned physical activity ands to avoid causing hypoglycemia.   ? D. His old Omnipod pump is working well. He also has a new Omnipod pump that has not been set up yet. His Dexcom G6  has been working well. Mom has continued to do FSBG testing using his Fastclix lancets. Mom sometimes has to reduce his food boluses. Although I have asked mom to give him his correction bolus before the meal and his food bolus after the meal, she continues to wait until after the meal to give his insulin boluses.  ? E. Johnny [name] has still not had the covid or flu vaccinations. Neither has mom. I encouraged both of  them to get the vaccine.   ? G. His appetite is good. Mom still controls the number of carbs he takes in. His step-father is not as careful. ? F. Johnny [name]'s speech continues to improve and he talks more. However, he still remains completely dependent on his mother for all of his basic needs. He can, however, forage for food at home.  ?  ?4. [name]:  ?Constitutional: Johnny [name] feels "good".   ?Eyes: Vision seems to be good when he wears his glasses. He had his last eye appointment in the Spring of 2022.The eye doctor did not see any signs of DM damage. He has a follow up eye appointment scheduled in June. ?Neck: Johnny [name] has not had any pains or swelling in the front part of his neck. There are no other recognized problems of the anterior neck.  ?Heart: There are no recognized heart problems. The ability to perform physical activities seems normal for his limited  level of physical activity.  ?Gastrointestinal: He is still hungry. He no longer has postprandial bloating. Bowel movents seem normal. There are no recognized GI problems. ?Hands: No problems ?Legs: Muscle mass and strength seem normal. He can perform his usual ADLs and other physical activities without obvious discomfort. He has no numbness, tingling, burning, or pains. No edema is noted.  ?Feet: His feet are dry. No edema is noted.  ?Neurologic: There are no recognized problems with muscle movement and strength, sensation, or coordination. ?Hypoglycemia: Mom says that he has had fewer low BGs.   ?Skin: His skin is dry. ? ?5. BG printout: We have data from the past 2 weeks. Average BG is 239, compared with 207 at his last visit and with 243 at his prior visit. BG range is 64-413, compared with 66-432 at his last visit and with 75-399 at his prior visit. Mom has still been giving his boluses after meals.  ? ?6. CGM printout: We have data from the past 2 weeks. Average SG is 244, compared with 225 at his last visit and with 207 at prior visit.  SGs average about 260 at midnight, 160 at breakfast, 230 at lunch, 260 at dinner, and 250 at bedtime. Most of his lower SGs occur around 8 AM. His highest SGs occur in the afternoons, evenings, and in the early morning hours.  His SGs vary with carb counts, timing of insulin boluses, and whether or not mom gives the insulin before or after meals.  ? ?PAST MEDICAL, FAMILY, AND SOCIAL HISTORY ?   ?Past Medical History:  ?Diagnosis Date  ? ADHD (attention deficit hyperactivity disorder)   ? Allergic rhinitis   ? Autism   ? Diabetes mellitus without complication ([place])   ? ? ?Family History  ?Problem Relation Age of Onset  ? Hypothyroidism Mother   ? Kidney disease Mother   ?     Mom unsure what her kidney issues are  ? Autism Sister   ? Mental retardation Sister   ? Cancer Maternal Grandmother   ? Cancer Maternal Grandfather   ?  Diabetes Paternal Grandmother   ?     Died at age 5.  ? Other Other   ?     Paternal great aunt with "hypoglycemia" unsure why  ? ? ? ?Current Outpatient Medications:  ?  Accu-Chek FastClix Lancets MISC, Use with Accu-Chek FastClix lancing device to check glucose 10x daily, Disp: 300 each, Rfl: 5 ?  acetone, urine, test strip, 1 strip by Does not apply route as needed for high blood sugar. (Patient not taking: Reported on 07/20/2021), Disp: 200 strip, Rfl: 6 ?  Blood Glucose Monitoring Suppl (ACCU-CHEK GUIDE ME) w/Device KIT, Use to check blood sugar 6 times per day, Disp: 1 kit, Rfl: 1 ?  cetirizine (ZYRTEC) 10 MG tablet, Take 1 tablet (10 mg total) by mouth daily. (Patient not taking: Reported on 07/20/2021), Disp: 90 tablet, Rfl: 3 ?  Continuous Blood Gluc Receiver (DEXCOM G6 RECEIVER) DEVI, USE AS DIRECTED, Disp: 1 each, Rfl: 0 ?  Continuous Blood Gluc Sensor (DEXCOM G6 SENSOR) MISC, USE AS DIRECTED CHANGE EVERY 10 DAYS, Disp: 3 each, Rfl: 5 ?  Continuous Blood Gluc Transmit (DEXCOM G6 TRANSMITTER) MISC, USE AS DIRECTED CHANGE EVERY 90 DAYS, Disp: 1 each, Rfl: 5 ?  diphenhydrAMINE  (BENADRYL) 25 MG tablet, Take 50 mg by mouth every 6 (six) hours as needed for itching or allergies.  (Patient not taking: Reported on 04/12/2021), Disp: , Rfl:  ?  glucagon (GLUCAGEN HYPOKIT) 1 MG SOLR injection, U

## 2021-10-17 ENCOUNTER — Other Ambulatory Visit: Payer: Self-pay

## 2021-10-17 ENCOUNTER — Telehealth (INDEPENDENT_AMBULATORY_CARE_PROVIDER_SITE_OTHER): Payer: Self-pay

## 2021-10-17 ENCOUNTER — Ambulatory Visit (INDEPENDENT_AMBULATORY_CARE_PROVIDER_SITE_OTHER): Payer: Medicaid Other | Admitting: "Endocrinology

## 2021-10-17 ENCOUNTER — Encounter (INDEPENDENT_AMBULATORY_CARE_PROVIDER_SITE_OTHER): Payer: Self-pay

## 2021-10-17 ENCOUNTER — Encounter (INDEPENDENT_AMBULATORY_CARE_PROVIDER_SITE_OTHER): Payer: Self-pay | Admitting: "Endocrinology

## 2021-10-17 VITALS — BP 110/68 | HR 64 | Wt 160.8 lb

## 2021-10-17 DIAGNOSIS — E1065 Type 1 diabetes mellitus with hyperglycemia: Secondary | ICD-10-CM

## 2021-10-17 DIAGNOSIS — E049 Nontoxic goiter, unspecified: Secondary | ICD-10-CM | POA: Diagnosis not present

## 2021-10-17 DIAGNOSIS — E10649 Type 1 diabetes mellitus with hypoglycemia without coma: Secondary | ICD-10-CM

## 2021-10-17 DIAGNOSIS — F840 Autistic disorder: Secondary | ICD-10-CM

## 2021-10-17 DIAGNOSIS — E063 Autoimmune thyroiditis: Secondary | ICD-10-CM | POA: Diagnosis not present

## 2021-10-17 DIAGNOSIS — F4320 Adjustment disorder, unspecified: Secondary | ICD-10-CM

## 2021-10-17 DIAGNOSIS — F79 Unspecified intellectual disabilities: Secondary | ICD-10-CM

## 2021-10-17 LAB — POCT GLUCOSE (DEVICE FOR HOME USE): POC Glucose: 121 mg/dl — AB (ref 70–99)

## 2021-10-17 LAB — POCT GLYCOSYLATED HEMOGLOBIN (HGB A1C): Hemoglobin A1C: 8.3 % — AB (ref 4.0–5.6)

## 2021-10-17 NOTE — Patient Instructions (Signed)
Follow up visit in 3 months.   At [facility], we are committed to providing exceptional care. You will receive a patient satisfaction survey through text or email regarding your visit today. Your opinion is important to me. Comments are appreciated.   

## 2021-10-17 NOTE — Telephone Encounter (Signed)
Mo spoke with Randa Evens. Randa Evens wants [place] to call while they are here in the office. Medical letter of necessity has been faxed 4 times. Insurance will pay for 200 strips. Mom wants the Freestyle libre strips for the Omni pod. Im going to call to see if the patient has pharmacy benefits for his supplies. Dexcom, Omni pod and strips.  ?

## 2021-10-17 NOTE — Telephone Encounter (Signed)
letter

## 2021-10-18 NOTE — Telephone Encounter (Signed)
Spoke with [employer]acks ref #: [id number] ?

## 2021-10-19 ENCOUNTER — Other Ambulatory Visit (INDEPENDENT_AMBULATORY_CARE_PROVIDER_SITE_OTHER): Payer: Self-pay | Admitting: "Endocrinology

## 2021-10-23 NOTE — Telephone Encounter (Signed)
Spoke with Oletta Lamas. Because there was a denial for the strips Dr Tobe Sos will have to call the insurance to let them know why its medically necessary for the patient to have strips and the dexcom.  ?

## 2021-10-30 ENCOUNTER — Encounter (INDEPENDENT_AMBULATORY_CARE_PROVIDER_SITE_OTHER): Payer: Self-pay | Admitting: Pharmacist

## 2021-10-30 ENCOUNTER — Telehealth (INDEPENDENT_AMBULATORY_CARE_PROVIDER_SITE_OTHER): Payer: Self-pay | Admitting: "Endocrinology

## 2021-10-30 NOTE — Telephone Encounter (Signed)
Mom states that his pump has completely failed. Hes on the original omni pod. Mom said that he has a Museum/gallery exhibitions officer. . The older one. She needs help with the settings on it.She is calling the front desk to set up the pump start with Dr Lovena Le. Mom states that she called over the weekend and was told by a nurse that we didn't have an on call provider working this weekend. She states that she called [phone].  Mom states that Andrews sugars hasnt been under 300. Mom dosent know how much to give with the pen. Shes going by his first carb:insulin.  ?

## 2021-10-30 NOTE — Telephone Encounter (Signed)
[facility] Call ID: [phone] ? ?Caller states that her sons medical device has stopped working. Mother has no settings from the other device. Mother needs a doctor to call her back to get the device running properly to receive insulin. No insulin is coming out. ? ?Patient see's Dr. Fransico Michael all the time. Patient has Dr. Ladona Ridgel assisted caller with the medical device last time. Caller says the Omni pod company was unable to help- advised caller to contact provider.  ?

## 2021-10-30 NOTE — Telephone Encounter (Signed)
Mother called. ?2. Subjective:  ?A. Waleed's pump died on 11-29-2022, 11/23/21 about 2;30 in the morning. Mom has Novolog pens and Lantus pens. She has given him the Novolog, but has not given any Lantus insulin. She called [facility], but was told that there was not a doctor on call.  ?B. BGs have been above 320. She has not checked for ketones.  ?C. She has the new Omnipod pump, but doesn't have the settings.  ?3. Assessment: He needs an updated MDI plan ?4. Plan ? A. Give 33 units of Lantus daily ? B. BG targets: I dictated a table for her.  ?  MN: 150 ?  6 AM: 150 ?  9 PM: 150 ? C. ISF: 1 unit for every 50 points above BG target ? D. ICR:   ?  MN: 15 ?  6 AM: 15 ?  12 PM: 15 ? E. Call Dr. Lovena Le to set up new pump.  ? F. Call me tomorrow afternoon ? ?Tillman Sers, MD, CDCES ? ?

## 2021-10-31 ENCOUNTER — Telehealth (INDEPENDENT_AMBULATORY_CARE_PROVIDER_SITE_OTHER): Payer: Self-pay | Admitting: Pediatric Endocrinology

## 2021-10-31 ENCOUNTER — Encounter (INDEPENDENT_AMBULATORY_CARE_PROVIDER_SITE_OTHER): Payer: Self-pay | Admitting: "Endocrinology

## 2021-10-31 NOTE — Telephone Encounter (Signed)
Letter was routed to be faxed to [phone]  thru epic per Gaylyn Rong' request ?

## 2021-10-31 NOTE — Telephone Encounter (Signed)
?  Name of who is calling:Kris  ? ?Caller's Relationship to Patient:Insurance  ? ?Best contact number:[phone]  ? ?Provider they see:Dr.Badik  ? ?Reason for call:Needs letter faxed to [phone] explaining why he needs to test his glucose levels 9 x daily  ? ? ? ? ?PRESCRIPTION REFILL ONLY ? ?Name of prescription: ? ?Pharmacy: ?. ? ?

## 2021-10-31 NOTE — Telephone Encounter (Signed)
**  Patient sees Dr. Tobe Sos, not Dr. Baldo Ash.**  Mother, Johnny [name], called requesting to speak to Johnny [name] regarding a letter for Medicaid. Please call mother at [phone]. Johnny [name]

## 2021-11-01 ENCOUNTER — Encounter (INDEPENDENT_AMBULATORY_CARE_PROVIDER_SITE_OTHER): Payer: Self-pay | Admitting: Pharmacist

## 2021-11-01 ENCOUNTER — Ambulatory Visit (INDEPENDENT_AMBULATORY_CARE_PROVIDER_SITE_OTHER): Payer: Medicaid Other | Admitting: Pharmacist

## 2021-11-01 ENCOUNTER — Telehealth (INDEPENDENT_AMBULATORY_CARE_PROVIDER_SITE_OTHER): Payer: Self-pay | Admitting: "Endocrinology

## 2021-11-01 DIAGNOSIS — E1065 Type 1 diabetes mellitus with hyperglycemia: Secondary | ICD-10-CM

## 2021-11-01 MED ORDER — OMNIPOD 5 DEXG7G6 INTRO GEN 5 KIT: 1.0000 | PACK | 2 refills | Status: DC

## 2021-11-01 NOTE — Progress Notes (Signed)
This is a Pediatric Specialist virtual follow up consult provided via telephone. ?Johnny [name] and parent [name] consented to an telephone visit consult today.  ?Location of patient: Parnell Spieler and [name] are at home. ?Location of provider: Zachery Conch, PharmD, BCACP, CDCES, CPP is at office.  ? ?I connected with Deretha Emory parent [name] on 11/01/21  by telephone and verified that I am speaking with the correct person using two identifiers. He received his Lantus yesterday at [facility]. Patient's pump recently broke and he received a replacement in the mail. ? ?Omnipod Original or Eros Pump Settings ? ?Basal (Max: 2.5 units/hr) ?12AM 1.25  ?12PM 0.90  ?6PM 1.20  ?     ?   ?   ?Total: 27.6 units ? ? ?Insulin to carbohydrate ratio (ICR)  ?12AM 15  ?6AM 18  ?   ?   ?     ?     ?Max Bolus: 30 units  ? ?Insulin Sensitivity Factor (ISF) ?12AM 50  ?   ?   ?   ?     ?     ? ? ?Target/ Correct Above BG ?12AM 180  ?6AM 150  ?9PM 180  ?   ?     ?     ? ?Temp Basal: ON (%) ?BG Sounds: ON  ?Lower limit: 80 mg/dL ?Upper limit: 180 mg/dL ?Bolus calculator: ON ?Minimum BG for bolus calc: 70 mg ?Reverse correction: OFF ?Duration of insulin calculator: ON  ?Bolus increments: 0.05 units ?Extended bolus: ON (%)  ?Low Reservoir Level: 20 units  ?Pod Expiration: 4 hours ? ?Advised patient to restart pump today at or after 6PM since he received Lantus yesterday at [facility]. ? ?This appointment required 30 minutes of patient care (this includes precharting, chart review, review of results, virtual care, etc.). ? ?Time spent since initial appt 10/21/21 - 11/19/21: 30 minutes  ?-11/01/21: 30 minutes (billed no charge) ? ?Thank you for involving clinical pharmacist/diabetes educator to assist in providing this patient's care.  ? ?Zachery Conch, PharmD, BCACP, CDCES, CPP ? ?

## 2021-11-01 NOTE — Telephone Encounter (Signed)
[facility] call ID: [id number] ? ?Caller [name]) states they wanted to know the results of their son's blood sugar. ?

## 2021-11-03 ENCOUNTER — Telehealth (INDEPENDENT_AMBULATORY_CARE_PROVIDER_SITE_OTHER): Payer: Self-pay | Admitting: Pharmacist

## 2021-11-03 NOTE — Telephone Encounter (Signed)
Called and spoke to representative at [facility], told them I had faxed over the document and they stated understanding and if anything else is needed they will reach out ?

## 2021-11-03 NOTE — Telephone Encounter (Signed)
?  Name of who is calling: ? ?Caller's Relationship to Patient:[facility]  ? ?Best contact [phone] ? ?Provider they see:Dr. Tobe Sos  ? ?Reason for call:[facility] needs a new glucose testing CMN faxed over with #5 answered, initialed and signed.  ?Fax# [phone] ? ? ? ?PRESCRIPTION REFILL ONLY ? ?Name of prescription: ? ?Pharmacy: ? ? ?

## 2021-11-08 ENCOUNTER — Telehealth (INDEPENDENT_AMBULATORY_CARE_PROVIDER_SITE_OTHER): Payer: Medicaid Other | Admitting: Pharmacist

## 2021-11-08 ENCOUNTER — Encounter (INDEPENDENT_AMBULATORY_CARE_PROVIDER_SITE_OTHER): Payer: Self-pay | Admitting: Pharmacist

## 2021-11-08 DIAGNOSIS — E1065 Type 1 diabetes mellitus with hyperglycemia: Secondary | ICD-10-CM

## 2021-11-08 NOTE — Progress Notes (Addendum)
This is a Pediatric Specialist E-Visit (My Chart Video Visit) follow up consult provided via [place] and Johnny [name] consented to an E-Visit consult today.  Location of patient: Johnny [name] and Johnny [name] are at home  Location of provider: Drexel Iha, PharmD, BCACP, CDCES, CPP is at office.    S:     Chief Complaint  Patient presents with   Diabetes    Dexcom G6     Endocrinology provider: Dr. Tobe Sos (01/17/22 3:45 pm)  Patient referred to me by Dr. Tobe Sos for [place] training. PMH significant for T1DM, chronic allergic rhinitis, goiter, acne vulgaris, atopic dermaitis, ADHD, autistic disorder. He is currently wearing the Omnipod Eros/Original  pump and the Dexcom G6 CGM.  I connected with Einar Grad and Johnny [name] on 11/08/21 by video and verified that I am speaking with the correct person using two identifiers. Mom reports she has obtained [place] from the pharmacy.  Gmail [email or web address]*   Dexcom Clarity [email or web address]*   Insurance Coverage: Traditional [place] Medicaid   Preferred Pharmacy: [facility] [place] - [address] [place] [address]  [address] [place], [place] [address]  Phone:  [phone]  Fax:  [phone]  DEA #:  [id number]  [place] Reason: --    O:   Labs:    There were no vitals filed for this visit.  HbA1c Lab Results  Component Value Date   HGBA1C 8.3 (A) 10/17/2021   HGBA1C 8.7 (A) 07/20/2021   HGBA1C 8.7 (A) 04/12/2021    Pancreatic Islet Cell Autoantibodies Lab Results  Component Value Date   ISLETAB 80 (A) 06/26/2013    Insulin Autoantibodies Lab Results  Component Value Date   INSULINAB <0.4 06/26/2013    Glutamic Acid Decarboxylase Autoantibodies Lab Results  Component Value Date   GLUTAMICACAB >30.0 (H) 06/26/2013    ZnT8 Autoantibodies No results found for: ZNT8AB  IA-2 Autoantibodies No results  found for: LABIA2  C-Peptide Lab Results  Component Value Date   CPEPTIDE 0.64 (L) 10/22/2013    Microalbumin Lab Results  Component Value Date   MICRALBCREAT 3 07/20/2021    Lipids    Component Value Date/Time   CHOL 116 07/20/2021 1521   TRIG 100 07/20/2021 1521   HDL 44 07/20/2021 1521   CHOLHDL 2.6 07/20/2021 1521   VLDL 31 (H) 06/27/2016 0001   LDLCALC 53 07/20/2021 1521    Assessment: We attempted to download the Dexcom G6 CGM on Krishiv's phone; unfortunately, we kept receiving a "network is down message". Attempted to make account with mother's 3 emails; unsuccessful. I created a gmail for Jaisen and made him a dexcom account. Advised mother to contact technical support for assistance downloading the Dexcom G6 app. Emailed mother with account information and dexcom technical support number. Will follow up in a few weeks to assist with creating [employer] and [employer] accounts.  Plan: Dexcom G6 Contact technical support for assistance downloading the Dexcom G6 app Emailed mother with account information and dexcom technical support number Monitoring:  Continue wearing Dexcom G6 CGM Jahmir Salo has a diagnosis of diabetes, checks blood glucose readings > 4x per day, treats with > 3 insulin injections or wears an insulin pump, and requires frequent adjustments to insulin regimen. This patient will be seen every six months, minimally, to assess adherence to their CGM regimen and diabetes treatment plan. Follow Up: 11/22/21   Emailed patient  instructions to [email or web address]  Hi!  Here are the accounts I made  Gmail [email or web address] [phone]*        Dexcom Clarity [email or web address]*   Please call Dexcom Technical Support 1 [phone] to help download Dexcom G6 app on Hakeem's phone   Thanks!   This appointment required 60 minutes of patient care (this includes precharting, chart review, review of results, face-to-face care,  etc.).  Thank you for involving clinical pharmacist/diabetes educator to assist in providing this patient's care.  Drexel Iha, PharmD, BCACP, CDCES, CPP  I have reviewed the following documentation and am in agreeance with the plan. I was immediately available to the clinical pharmacist for questions and collaboration.  Tillman Sers, MD

## 2021-11-10 ENCOUNTER — Encounter (INDEPENDENT_AMBULATORY_CARE_PROVIDER_SITE_OTHER): Payer: Self-pay | Admitting: Pharmacist

## 2021-11-16 ENCOUNTER — Telehealth (INDEPENDENT_AMBULATORY_CARE_PROVIDER_SITE_OTHER): Payer: Medicaid Other | Admitting: Pharmacist

## 2021-11-21 ENCOUNTER — Telehealth (INDEPENDENT_AMBULATORY_CARE_PROVIDER_SITE_OTHER): Payer: Self-pay | Admitting: "Endocrinology

## 2021-11-21 ENCOUNTER — Telehealth (INDEPENDENT_AMBULATORY_CARE_PROVIDER_SITE_OTHER): Payer: Self-pay

## 2021-11-21 NOTE — Telephone Encounter (Signed)
?  Name of who is calling: ?[facility] ? ?Caller's Relationship to Patient: ? ?Best contact number: ?[phone] ? ?Provider they see: ?Dr. Leonard Schwartz ?Reason for call: ?Thayer Ohm is calling in to get prior authorization Dexcom G6 transmitter and sensors. She has requested a call back.  ? ? ? ?PRESCRIPTION REFILL ONLY ? ?Name of prescription: ? ?Pharmacy: ? ? ?

## 2021-11-21 NOTE — Telephone Encounter (Signed)
Called pharmacy and spoke to [place] to let her know that I had started the PA's for [employer]. She stated appreciation and will check in a couple days if she hasnt' heard back ?

## 2021-11-21 NOTE — Telephone Encounter (Signed)
Received phone call from  [place];  [place] to initiate PA's for [employer]. Initiated in [place]: ? ?Sensors: ? ? ?Transmitter: ? ?

## 2021-11-22 ENCOUNTER — Telehealth (INDEPENDENT_AMBULATORY_CARE_PROVIDER_SITE_OTHER): Payer: Medicaid Other | Admitting: Pharmacist

## 2021-11-22 DIAGNOSIS — E1065 Type 1 diabetes mellitus with hyperglycemia: Secondary | ICD-10-CM

## 2021-11-22 NOTE — Progress Notes (Addendum)
This is a Pediatric Specialist E-Visit (My Chart Video Visit) follow up consult provided via [employer] Lorre Munroe and [name] consented to an E-Visit consult today.  Location of patient: Jiovanny Burdell and [name] are at home  Location of provider: Zachery Conch, PharmD, BCACP, CDCES, CPP is at office.    S:     Chief Complaint  Patient presents with   Diabetes    Insulin Pump Accounts     Endocrinology provider: Dr. Fransico Michael (upcoming appt 01/17/22 3:45 pm)  Patient referred to me by Dr. Fransico Michael for [facility] G6 training. PMH significant for T1DM, chronic allergic rhinitis, goiter, acne vulgaris, atopic dermaitis, ADHD, autistic disorder. He is currently wearing the Omnipod Eros/Original  pump and the Dexcom G6 CGM.  I connected with Lorre Munroe and [name] on 11/22/21 by video and verified that I am speaking with the correct person using two identifiers.   Gmail [email or web address] [phone]*   Dexcom Clarity [email or web address]*   [employer] [email or web address]*   [employer]   Insurance Coverage: Traditional [place] Medicaid   Preferred Pharmacy: [facility] [phone] - [place], [place] - [address] AT [facility] [address]  [address], [place] [address]  Phone:  [phone]  Fax:  [phone]  DEA #:  [id number]  [place] Reason: --    O:   Labs:    There were no vitals filed for this visit.  HbA1c Lab Results  Component Value Date   HGBA1C 8.3 (A) 10/17/2021   HGBA1C 8.7 (A) 07/20/2021   HGBA1C 8.7 (A) 04/12/2021    Pancreatic Islet Cell Autoantibodies Lab Results  Component Value Date   ISLETAB 80 (A) 06/26/2013    Insulin Autoantibodies Lab Results  Component Value Date   INSULINAB <0.4 06/26/2013    Glutamic Acid Decarboxylase Autoantibodies Lab Results  Component Value Date   GLUTAMICACAB >30.0 (H) 06/26/2013    ZnT8 Autoantibodies No results found for: ZNT8AB  IA-2  Autoantibodies No results found for: LABIA2  C-Peptide Lab Results  Component Value Date   CPEPTIDE 0.64 (L) 10/22/2013    Microalbumin Lab Results  Component Value Date   MICRALBCREAT 3 07/20/2021    Lipids    Component Value Date/Time   CHOL 116 07/20/2021 1521   TRIG 100 07/20/2021 1521   HDL 44 07/20/2021 1521   CHOLHDL 2.6 07/20/2021 1521   VLDL 31 (H) 06/27/2016 0001   LDLCALC 53 07/20/2021 1521    Assessment/Plan: We were able to download Dexcom G6 and Dexcom Clarity on Maurion's phone. We successfully setup Dexcom G6 and Clarity on his phone. We downloaded Dexcom Follow on his mom's phone so she is able to see his blood sugar reading and obtain alarms. I assisted family with setting up poddercentral and glooko accounts. Will follow up on 11/23/21 to start Omnipod 5.  Emailed patient instructions to [email or web address]  Hi!  Here are the accounts I made  Gmail Username: [email or web address] Password: [phone]*        Dexcom Clarity Username: [email or web address] Password: [email or web address]*  Glooko - this is his login information to review his pump data Username: [email or web address] Password: [email or web address]*   [employer] - this is his login information for his new PDM Username: [email or web address] Password: [phone]!   This appointment required 60 minutes of patient care (this includes precharting, chart review, review of results, face-to-face care, etc.).  Thank you for involving clinical pharmacist/diabetes educator to assist in providing this patient's care.  Zachery Conch, PharmD,  BCACP, CDCES, CPP  I have reviewed the following documentation and am in agreeance with the plan. I was immediately available to the clinical pharmacist for questions and collaboration.  [name], MD

## 2021-11-23 ENCOUNTER — Telehealth (INDEPENDENT_AMBULATORY_CARE_PROVIDER_SITE_OTHER): Payer: Medicaid Other | Admitting: Pharmacist

## 2021-11-23 DIAGNOSIS — E1065 Type 1 diabetes mellitus with hyperglycemia: Secondary | ICD-10-CM

## 2021-11-23 MED ORDER — OMNIPOD 5 DEXG7G6 PODS GEN 5 MISC: 1.0000 | 4 refills | Status: DC

## 2021-11-23 NOTE — Patient Instructions (Signed)
It was a pleasure seeing you today! ? ?If your pump breaks, your long acting insulin dose would be Lantus 24 units daily. You would follow charts for your Novolog doses: ? ?[facility]  ?[address] ?[place], [place] [address] ?Telephone [phone]     Fax [phone] ?        ? ?Rapid-Acting Insulin Instructions (Novolog/Humalog/Apidra) ?(Target blood sugar 150, Insulin Sensitivity Factor 50, Insulin to Carbohydrate Ratio 1 unit for 15g)  ? ?SECTION A (Meals): ?1. At mealtimes, take rapid-acting insulin according to this ?Two-Component Method?.  ?a. Measure Fingerstick Blood Glucose (or use reading on continuous glucose monitor) 0-15 minutes prior to the meal. Use the ?Correction Dose Table? below to determine the dose of rapid-acting insulin needed to bring your blood sugar down to a baseline of 150. You can also calculate this dose with the following equation: (Blood sugar - target blood sugar) divided by 50. ? ?Correction Dose Table ?Blood Sugar Rapid-acting Insulin units  Blood Sugar Rapid-acting Insulin units  ?< 100 (-) 1  351-400 5  ?101-150 0  401-450 6  ?151-200 1  451-500 7  ?201-250 2  501-550 8  ?251-300 3  551-600 9  ?301-350 4  Hi (>600) 10  ? ?b. Estimate the number of grams of carbohydrates you will be eating (carb count). Use the ?Food Dose Table? below to determine the dose of rapid-acting insulin needed to cover the carbs in the meal. You can also calculate this dose using this formula: Total carbs divided by 15. ? ?Food Dose Table ? Grams of Carbs Rapid-acting Insulin units  Grams of Carbs Rapid-acting Insulin units  ?0-10 0  76-90        6  ?11-15 1  91-105        7  ?16-30 2  106-120        8  ?31-45 3  121-135        9  ?46-60 4  136-150       10  ?61-75 5  >150       11  ? ?c. Add up the Correction Dose plus the Food Dose = ?Total Dose? of rapid-acting insulin to be taken. ?d. If you know the number of carbs you will eat, take the  rapid-acting insulin 0-15 minutes prior to the meal; otherwise take the insulin immediately after the meal.  ? ? ?SECTION B (Bedtime/2AM): ?1. Wait at least 2.5-3 hours after taking your supper rapid-acting insulin before you do your bedtime blood sugar test. Based on your blood sugar, take a ?bedtime snack? according to the table below. These carbs are ?Free?Marland Kitchen You don't have to cover those carbs with rapid-acting insulin.  If you want a snack with more carbs than the ?bedtime snack? table allows, subtract the free carbs from the total amount of carbs in the snack and cover this carb amount with rapid-acting insulin based on the Food Dose Table from Page 1. ? ?Use the following column for your bedtime snack: ___________________ ? ?Bedtime Carbohydrate Snack Table ? ?Blood Sugar Large Medium Small Very Small  ?< 76         60 gms         50 gms         40 gms    30 gms  ?     76-100         50 gms         40 gms  30 gms    20 gms  ?   101-150         40 gms         30 gms         20 gms    10 gms  ?   151-199         30 gms         20gms ?                      10 gms      0  ?  200-250         20 gms         10 gms           0      0  ?  251-300         10 gms           0           0      0  ?    > 300           0           0                    0      0  ? ?2. If the blood sugar at bedtime is above 200, no snack is needed (though if you do want a snack, cover the entire amount of carbs based on the Food Dose Table on page 1). You will need to take additional rapid-acting insulin based on the Bedtime Sliding Scale Dose Table below. ? ?Bedtime Sliding Scale Dose Table ?Blood Sugar Rapid-acting Insulin units  ?<200 0  ?201-250 1  ?251-300 2  ?301-350 3  ?351-400 4  ?401-450 5  ?451-500 6  ?> 500 7  ? ?3. Then take your usual dose of long-acting insulin (Lantus, Basaglar, Evaristo Bury). ? ?4. If we ask you to check your blood sugar in the middle of the night (2AM-3AM), you should wait at least 3 hours after your last  rapid-acting insulin dose before you check the blood sugar.  You will then use the Bedtime Sliding Scale Dose Table to give additional units of rapid-acting insulin if blood sugar is above 200. This may be especially necessary in times of sickness, when the illness may cause more resistance to insulin and higher blood sugar than usual. ? ?[name], MD, CDE Signature: _____________________________________ ?Dessa Phi, MD   ?Judene Companion, MD    ?Gretchen Short, NP  Date: ______________ ? ?PLEASE REMEMBER TO CONTACT OFFICE IF YOU ARE AT RISK OF RUNNING OUT OF PUMP SUPPLIES, INSULIN PEN SUPPLIES, OR IF YOU WANT TO KNOW WHAT YOUR BACK UP INSULIN PEN DOSES ARE.  ? ?To summarize our visit, these are the major updates with Omnipod 5: ? ?Automated vs limited vs manual mode ?Automated mode: this is when the ?smart? pump is turned on and pump will adjust insulin based on Dexcom readings predicted 60 minutes into the future ?Limited mode: when pump is trying to connect to automated mode, however, there may be issues. For example, when new Dexcom sensor is applied there is a 2 hour warm up period (no CGM readings). ?Manual mode: this is when the ?smart? pump is NOT turned on and pump goes back to settings put in by provider (kind of like going back to [employer]) ?  You can switch modes by going to settings --> mode --> switch from automated to manual mode or vice versa ?Why would I switch from automated mode to manual mode? ?1. To put in new Dexcom transmitter code (reminder you must do this every 90 days AFTER you update it in Dexcom app) ?To do this you will change to manual mode --> settings --> CGM transmitter --> enter new code ?2. If you get put on steroid medications (e.g., prednisone, methylprednisolone) ?3. If you try activity mode and still experience low blood sugars then you can go to manual mode to turn on a temporary basal rate (decrease 100% in 30 min incrememnts) ?KEEP IN MIND LINE OF SIGHT WITH  DEXCOM! Dexcom and pod must be on the same side of the body. They can be across from each other on the abdomen or lower back/upper buttocks (refer to pages 20 and 21 in resource guide) ?Make sure to press use CGM rather than type in blood sugar when blousing. When you press use CGM it takes in consideration the Dexcom reading AND arrow.  ?Omnipod 5 pods will have a clear tab and have Omnipod 5 written on pod compared to Dash pods (blue tab). Omnipod Dash and Omnipod 5 pods cannot be interchangeable. You must solely use Omnipod 5 pods when using Omnipod 5 PDM/app.  ?If your Omnipod is having issues with receiving Dexcom readings make sure to move the PDM/cellphone closer to the POD (NOT the Dexcom) (refer to page 9 of resource guide to review system communication) ? ?Please contact me (Dr. Ladona Ridgelaylor) at [phone] or via Mychart with any questions/concerns   ?

## 2021-11-23 NOTE — Progress Notes (Signed)
? ?This is a Pediatric Specialist E-Visit (My Chart Video Visit) follow up consult provided via WebEx ?Johnny [name] and his mother Johnny [name] consented to an E-Visit consult today.  ?Location of patient: Johnny [name] and his mother Johnny [name] are at home  ?Location of provider: Zachery Conch, PharmD, BCACP, CDCES, CPP is at office.  ? ? ?Subjective: ? ?Chief Complaint  ?Patient presents with  ? Diabetes  ?  Omnipod 5 Pump Training   ? ? ?Endocrinology provider: Dr. Fransico Michael (upcoming appt 01/17/22 3:45 pm) ? ?Patient referred to me by Dr. Fransico Michael for [facility] G6 training. PMH significant for T1DM, chronic allergic rhinitis, goiter, acne vulgaris, atopic dermaitis, ADHD, autistic disorder. He is currently wearing the Omnipod Eros/Original  pump and the Dexcom G6 CGM. ? ?I connected with Johnny [name] and his mother Johnny [name] on 11/23/21 by video and verified that I am speaking with the correct person using two identifiers. ? ?Insurance: [place] [employer] ? ?Pharmacy  ?[id number] - [place], [place] - [address]  ?[address], [place] [address]  ?Phone:  [phone]  Fax:  [phone]  ?DEA #:  [id number]  ?DAW Reason: --  ? ? ?Omnipod Original Pump Settings ? ?MN: 1.2.0 -> 1.25 ?4 AM: 1.25  ?8 AM: 1.00 -> 1.25 ?Noon: 0.80 -> 0.90 ?6 PM: 1.10 -> 1.20 ?  ?Continue ISF: 50 ?  ?Continue BG targets: ?MN: 180 ?6 AM: 150 ?9 PM: 180 ?  ?Change ICRs: ?MN: 15  ?6 AM: 18 ?Noon: 18 ? ? ?Omnipod Education Training ?Please refer to Omnipod 5 Pod Start Checklist scanned into media ? ?Gmail ?Username: [email or web address] ?Password: [email or web address]       ?  ?Dexcom Clarity ?Username: [email or web address] ?Password: [email or web address] ?  ?[employer] - this is his login information to review his pump data ?Username: [email or web address] ?Password: [email or web address]             ?  ?[employer] - this is his login information for his new PDM ?Username: [email or web address] ?Password:  [email or web address] ? ?PIN [id number] ? ?Objective: ? ?Dexcom Clarity Report ? ? ? ?Prior [employer] and [employer] Report (October 04 2021 - October 17 2021) ? ? ? ? ? ?There were no vitals filed for this visit. ? ?HbA1c ?Lab Results  ?Component Value Date  ? HGBA1C 8.3 (A) 10/17/2021  ? HGBA1C 8.7 (A) 07/20/2021  ? HGBA1C 8.7 (A) 04/12/2021  ? ? ?Pancreatic Islet Cell Autoantibodies ?Lab Results  ?Component Value Date  ? ISLETAB 80 (A) 06/26/2013  ? ? ?Insulin Autoantibodies ?Lab Results  ?Component Value Date  ? INSULINAB <0.4 06/26/2013  ? ? ?Glutamic Acid Decarboxylase Autoantibodies ?Lab Results  ?Component Value Date  ? GLUTAMICACAB >30.0 (H) 06/26/2013  ? ? ?ZnT8 Autoantibodies ?No results found for: ZNT8AB ? ?IA-2 Autoantibodies ?No results found for: LABIA2 ? ?C-Peptide ?Lab Results  ?Component Value Date  ? CPEPTIDE 0.64 (L) 10/22/2013  ? ? ?Microalbumin ?Lab Results  ?Component Value Date  ? MICRALBCREAT 3 07/20/2021  ? ? ?Lipids ?   ?Component Value Date/Time  ? CHOL 116 07/20/2021 1521  ? TRIG 100 07/20/2021 1521  ? HDL 44 07/20/2021 1521  ? CHOLHDL 2.6 07/20/2021 1521  ? VLDL 31 (H) 06/27/2016 0001  ? LDLCALC 53 07/20/2021 1521  ? ? ?Assessment: ?Pump Settings - Reviewed current and prior Dexcom Clarity and [place] reports. TIR is not at goal >70%. Minimal hypoglycemia that does not occur as a  pattern. TDD was ~40 units at prior appt with Dr. Fransico Michael (10/17/21), however, Dr.Brennan increased basal rates. Based on rule of 450, appropriate ICR may be 11.25. Based on rule of 1800, appropriate ISF, may be 45. Mom overrides boluses and commonly reduces what bolus calculator advises. To closely mirror prior setting changes made at last appt with Dr. Fransico Michael will make basal rates 1.1 units per hour. Will continue ICR. Will slightly reduce ISF based on rule of 1800. Will also reduce target BG readings considering upgrade to hybrid closed loop system.  ? ?Pump Education - Omnipod pump applied successfully to right side of abdomen  (within line of sight from Dexcom G6 within the right side of abdomen). Parents appeared to have sufficient understanding of subjects discussed during Omnipod Training appt. ? ?Plan: ?Pump Settings ? ?Basal (Max: 2 units/hr) ?12AM 1.1  ?   ?   ?   ?     ?     ?Total: 26.4 units ? ?Insulin to carbohydrate ratio (ICR)  ?12AM 18  ?8AM 15  ?2PM 15  ?5PM 15  ?10PM 18  ?     ?Max Bolus: 10 units ? ?Insulin Sensitivity Factor (ISF) ?12AM 50  ?8AM 45  ?10PM 50  ?   ?     ?     ? ? ?Target and Correct Above BG ?12AM 150  ?8AM 130  ?10PM 150  ?   ?     ?     ? ?Temp basal: ON ?Duration of insulin action: 3 hours  ?Extended bolus: ON ?Reverse Correction: OFF ? ? ?Omnipod Pump Education:  ?Continue to wear Omnipod and change pod every 3 days (pod filled 200 units) ?Thoroughly discussed how to assess bad infusion site change and appropriate management (notice BG is elevated, attempt to bolus via pump, recheck BG in 30 minutes, if BG has not decreased then disconnect pump and administer bolus via insulin pen, apply new infusion set, and repeat process).  ?Discussed back up plan if pump breaks (how to calculate insulin doses using insulin pens). Provided written copy of patient's current pump settings and handout explaining math on how to calculate settings. Discussed examples with family. Patient was able to use teach back method to demonstrate understanding of calculating dose for basal/bolus insulin pens from insulin pump settings.  ?Patient has Lantus and Novolog insulin pen refills to use as back up until 2024. Reminded family they will need a new prescription annually.  ?Reimbursement ?Uploaded Omnipod 5 Pod Start Checklist and Omnipod Dash Pump Therapy Order Form to [place] ?Follow Up:  ?Dr. Fransico Michael (upcoming appt 01/17/22 3:45 pm) ? ?Emailed Omnipod 5 Resource guide to [email or web address] ? ?It was a pleasure seeing you today! ? ?If your pump breaks, your long acting insulin dose would be Lantus 24 units daily. You  would follow charts for your Novolog doses: ? ?[facility]  ?[address], [address] ?[place], [place] [address] ?Telephone [phone]     Fax [phone] ?        ? ?Rapid-Acting Insulin Instructions (Novolog/Humalog/Apidra) ?(Target blood sugar 150, Insulin Sensitivity Factor 50, Insulin to Carbohydrate Ratio 1 unit for 15g)  ? ?SECTION A (Meals): ?1. At mealtimes, take rapid-acting insulin according to this ?Two-Component Method?.  ?a. Measure Fingerstick Blood Glucose (or use reading on continuous glucose monitor) 0-15 minutes prior to the meal. Use the ?Correction Dose Table? below to determine the dose of rapid-acting insulin needed to bring your blood sugar down to  a baseline of 150. You can also calculate this dose with the following equation: (Blood sugar - target blood sugar) divided by 50. ? ?Correction Dose Table ?Blood Sugar Rapid-acting Insulin units  Blood Sugar Rapid-acting Insulin units  ?< 100 (-) 1  351-400 5  ?101-150 0  401-450 6  ?151-200 1  451-500 7  ?201-250 2  501-550 8  ?251-300 3  551-600 9  ?301-350 4  Hi (>600) 10  ? ?b. Estimate the number of grams of carbohydrates you will be eating (carb count). Use the ?Food Dose Table? below to determine the dose of rapid-acting insulin needed to cover the carbs in the meal. You can also calculate this dose using this formula: Total carbs divided by 15. ? ?Food Dose Table ? Grams of Carbs Rapid-acting Insulin units  Grams of Carbs Rapid-acting Insulin units  ?0-10 0  76-90        6  ?11-15 1  91-105        7  ?16-30 2  106-120        8  ?31-45 3  121-135        9  ?46-60 4  136-150       10  ?61-75 5  >150       11  ? ?c. Add up the Correction Dose plus the Food Dose = ?Total Dose? of rapid-acting insulin to be taken. ?d. If you know the number of carbs you will eat, take the rapid-acting insulin 0-15 minutes prior to the meal; otherwise take the insulin immediately after the meal.  ? ? ?SECTION B  (Bedtime/2AM): ?1. Wait at least 2.5-3 hours after taking your supper rapid-acting insulin before you do your bedtime blood sugar test. Based on your blood sugar, take a ?bedtime snack? according to the table below. Thes

## 2021-12-12 ENCOUNTER — Encounter (INDEPENDENT_AMBULATORY_CARE_PROVIDER_SITE_OTHER): Payer: Self-pay | Admitting: Pharmacist

## 2021-12-12 ENCOUNTER — Telehealth (INDEPENDENT_AMBULATORY_CARE_PROVIDER_SITE_OTHER): Payer: Self-pay

## 2021-12-12 DIAGNOSIS — E1065 Type 1 diabetes mellitus with hyperglycemia: Secondary | ICD-10-CM

## 2021-12-12 MED ORDER — ACCU-CHEK GUIDE VI STRP: ORAL_STRIP | 5 refills | Status: DC

## 2021-12-12 NOTE — Telephone Encounter (Signed)
Accu-check test strips sent to [facility] in [place] as requested by pts mom

## 2021-12-14 ENCOUNTER — Other Ambulatory Visit (INDEPENDENT_AMBULATORY_CARE_PROVIDER_SITE_OTHER): Payer: Self-pay | Admitting: "Endocrinology

## 2021-12-29 ENCOUNTER — Other Ambulatory Visit (INDEPENDENT_AMBULATORY_CARE_PROVIDER_SITE_OTHER): Payer: Self-pay | Admitting: "Endocrinology

## 2021-12-29 ENCOUNTER — Encounter (INDEPENDENT_AMBULATORY_CARE_PROVIDER_SITE_OTHER): Payer: Self-pay | Admitting: Pharmacist

## 2021-12-29 ENCOUNTER — Other Ambulatory Visit (INDEPENDENT_AMBULATORY_CARE_PROVIDER_SITE_OTHER): Payer: Self-pay

## 2021-12-29 DIAGNOSIS — E1065 Type 1 diabetes mellitus with hyperglycemia: Secondary | ICD-10-CM

## 2021-12-29 DIAGNOSIS — R21 Rash and other nonspecific skin eruption: Secondary | ICD-10-CM

## 2021-12-29 MED ORDER — OMNIPOD 5 DEXG7G6 PODS GEN 5 MISC: 1.0000 | 5 refills | Status: DC

## 2022-01-02 MED ORDER — ACCU-CHEK GUIDE VI STRP: ORAL_STRIP | 5 refills | Status: AC

## 2022-01-02 MED ORDER — DEXCOM G6 SENSOR MISC: 11 refills | Status: AC

## 2022-01-02 MED ORDER — OMNIPOD 5 DEXG7G6 PODS GEN 5 MISC: 1.0000 | 5 refills | Status: AC

## 2022-01-02 MED ORDER — DEXCOM G6 TRANSMITTER MISC: 5 refills | Status: AC

## 2022-01-02 NOTE — Telephone Encounter (Signed)
DELAYED DOCUMENTATION  Dexcom Sensors & Transmitter APPROVED from 11/21/2021 thru 11/21/2022  Sensors:    Transmitter:

## 2022-01-05 ENCOUNTER — Telehealth (INDEPENDENT_AMBULATORY_CARE_PROVIDER_SITE_OTHER): Payer: Self-pay | Admitting: "Endocrinology

## 2022-01-05 NOTE — Telephone Encounter (Signed)
  Name of who is calling:Elaine   Caller's Relationship to Patient:mother   Best contact [phone]  Provider they see:Dr.Brennan   Reason for call:mom called to see if a referral could be sent to the dermatologist for [facility] skin condition. I referred mom back to her PCP but she stated that she had a conversation with Dr. Fransico Michael about him sending one if needed.      PRESCRIPTION REFILL ONLY  Name of prescription:  Pharmacy:

## 2022-01-05 NOTE — Telephone Encounter (Signed)
Patient also sent a mychart message, see mychart message for further information.

## 2022-01-16 NOTE — Progress Notes (Signed)
Subjective:  Patient Name: Johnny [name] Date of Birth: [birth year]  MRN: [id number]  Johnny [name]  presents at today's video visit for follow-up evaluation and management  of his T1DM, hypoglycemia, autism, ADHD, possible mental retardation, goiter, thyroiditis, adjustment reaction, and family history of autoimmune thyroid disease.  HISTORY OF PRESENT ILLNESS:   Johnny [name] is a 26 y.o. Caucasian young man.   Johnny [name] was accompanied by his mother, Johnny [name].  70. Johnny [name] was seen at [facility] in [place], [place], on 06/26/13 for complaints of weight loss, fatigue, personality change, polyuria, polydipsia, and vomiting. New-onset DM and DKA were diagnosed. He received fluids at the ED at [facility], was transferred to [facility], and was admitted to the PICU for evaluation and management of new-onset T1DM, DKA, dehydration, and ketonuria in the setting of pre-existing autism, ADHD, and possible mental retardation. Significantly abnormal lab results included: Venous pH 7.257, serum potassium 3.0, serum CO2 15, serum glucose 257, hemoglobin A1c 13.7%, C-peptide 0.19 (normal 0.80-3.09), anti-glutamic acid decarboxylase (GAD) antibody > 30 (normal < 1.0), anti-islet cell antibody 80 (normal < 5). TSH was 2.163, free T4 0.82, free T3 1.8 (normal 2.3-4.2). He was treated initially with infusions of insulin and fluids. When his DKA resolved, he was converted to a multiple daily injection (MDI) insulin plan, using Lantus as a basal insulin and Novolog aspart as a bolus insulin at mealtimes, bedtime, and 2 AM if needed. 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   2. Clinical course: A. Johnny [name] was discharged from [facility] on 07/04/13. He and his mother participated in our [place] education program with Ms. [name] Eaton, RN. 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 When he went into the honeymoon period his  Lantus dose was decreased to 6 units at night. Since then, however, he has exited the honeymoon period and has had a gradual but progressive increase in his exogenous insulin requirement. He started his Dexcom G5 CGM in early 2016. He was converted to an Omnipod pump on 01/16/16. He was subsequently converted to a Dexcom G6.  B. Johnny [name] had covid in July 2022. He has fully recovered.   3. [name] occurred on 10/17/21. At that visit I changed several basal rate settings. He has since started on his new Omnipod 5 pump and Dexcom G6.  A. In the interim he has been healthy. His allergies continue to be better since moving to [place], [place].  B. He still has a fine, punctate rash on his neck, back and arms. He has not yet seen a dermatologist.   C. Mom has had several telephone calls and video visits to set up his Dexcom G6 and his new Omnipod 5 pump with Dr. Lovena Le to help get his new Omnipod pump set up. The new system is working Conservation officer, historic buildings".  D. BG discussion: 1). He has been eating a series of prepared meals that were delivered to the house and that he eats about 6 PM. As a result, his BGs are better.   2). Johnny [name] has not had many low BGs. Mom still adjusts his boluses to try to compensate for planned physical activity ands to avoid causing hypoglycemia.    D. His new Omnipod 5 pump is working well.  His Dexcom G6 has been working well. Although I have asked mom to give him his correction bolus before the meal and his food  bolus after the meal, she continues to wait until after the meal to give his insulin boluses for the meals that he may not like as well.    Johnny [name] has still not had the covid or flu vaccinations. Neither has mom. I encouraged both of them to get the vaccine.    G. His appetite is good. Mom still controls the number of carbs he takes in. His step-father is not as careful.  F. Johnny [name]'s speech continues to improve and he talks more.  However, he still remains completely dependent on his mother for all of his basic needs. He can, however, forage for food at home.    4. [name]:  Constitutional: Johnny [name] feels "good".   Eyes: Vision seems to be good when he wears his glasses. He had his last eye appointment 3 weeks ago. The eye doctor did not see any signs of DM damage.  Neck: Johnny [name] has not had any pains or swelling in the front part of his neck. There are no other recognized problems of the anterior neck.  Heart: There are no recognized heart problems. The ability to perform physical activities seems normal for his limited  level of physical activity.  Gastrointestinal: He is still hungry. Mom says his belly is getting bigger. He no longer has postprandial bloating. Bowel movents seem normal. There are no recognized GI problems. Hands: No problems Legs: Muscle mass and strength seem normal. He can perform his usual ADLs and other physical activities without obvious discomfort. He has no numbness, tingling, burning, or pains. No edema is noted.  Feet: His feet are dry. No edema is noted.  Neurologic: There are no recognized problems with muscle movement and strength, sensation, or coordination. Hypoglycemia: Mom says that he has had fewer low BGs.   Skin: His rash persists.   5. BG printout: We do not have any data.   6. CGM printout: We have data from the past 2 weeks. Average SG is 197, compared with 244 at his last visit and with 225 at his prior visit. SGs average about 230 at midnight, 150 at breakfast, 220 at lunch, 175 at dinner, and 230 at bedtime. His SG range is about 75-360. Most of his higher SGs occur during the night and late in the evening. His SGs vary with carb counts, timing of insulin boluses, and whether or not mom gives the insulin before or after meals.   PAST MEDICAL, FAMILY, AND SOCIAL HISTORY    Past Medical History:  Diagnosis Date   ADHD (attention deficit hyperactivity disorder)     Allergic rhinitis    Autism    Diabetes mellitus without complication (Uncertain)     Family History  Problem Relation Age of Onset   Hypothyroidism Mother    Kidney disease Mother        Mom unsure what her kidney issues are   Autism Sister    Mental retardation Sister    Cancer Maternal Grandmother    Cancer Maternal Grandfather    Diabetes Paternal Grandmother        Died at age 67.   Other Other        Paternal great aunt with "hypoglycemia" unsure why     Current Outpatient Medications:    Accu-Chek FastClix Lancets MISC, USE TO check 10 times DAILY, Disp: 300 each, Rfl: 5   acetone, urine, test strip, 1 strip by Does not apply route as needed for high blood sugar. (Patient not taking: Reported  on 07/20/2021), Disp: 200 strip, Rfl: 6   Blood Glucose Monitoring Suppl (ACCU-CHEK GUIDE ME) w/Device KIT, Use to check blood sugar 6 times per day, Disp: 1 kit, Rfl: 1   cetirizine (ZYRTEC) 10 MG tablet, Take 1 tablet (10 mg total) by mouth daily. (Patient not taking: Reported on 07/20/2021), Disp: 90 tablet, Rfl: 3   Continuous Blood Gluc Receiver (DEXCOM G6 RECEIVER) DEVI, USE AS DIRECTED (Patient not taking: Reported on 11/23/2021), Disp: 1 each, Rfl: 0   Continuous Blood Gluc Sensor (DEXCOM G6 SENSOR) MISC, CHANGE every 10 DAYS as directed, Disp: 3 each, Rfl: 11   Continuous Blood Gluc Transmit (DEXCOM G6 TRANSMITTER) MISC, USE AS DIRECTED CHANGE EVERY 90 DAYS, Disp: 1 each, Rfl: 5   diphenhydrAMINE (BENADRYL) 25 MG tablet, Take 50 mg by mouth every 6 (six) hours as needed for itching or allergies.  (Patient not taking: Reported on 04/12/2021), Disp: , Rfl:    glucagon (GLUCAGEN HYPOKIT) 1 MG SOLR injection, Use in instances of severe low glucose, or if unresponsive (Patient not taking: Reported on 04/12/2021), Disp: 2 each, Rfl: 5   glucose blood (ACCU-CHEK GUIDE) test strip, Use to check blood sugar up to 10 times per day, Disp: 300 each, Rfl: 5   Insulin Disposable Pump (OMNIPOD 5 G6  POD, GEN 5,) MISC, Inject 1 Device into the skin as directed. Change pod every 2 days. Patient will need 3 boxes (each contain 5 pods) for a 30 day supply. Please fill for [facility] [id number]., Disp: 15 each, Rfl: 5   Insulin Pen Needle (PEN NEEDLES) 32G X 4 MM MISC, Use with insulin pen up to 6x per day, Disp: 200 each, Rfl: 11   LANTUS SOLOSTAR 100 UNIT/ML Solostar Pen, USE UP TO 50 units DAILY in THE event pump fails (Patient not taking: Reported on 11/23/2021), Disp: 15 mL, Rfl: 5   NOVOLOG 100 UNIT/ML injection, administer 200 units via isulin pump EVERY 48 hours, Disp: 40 mL, Rfl: 5   NOVOLOG FLEXPEN 100 UNIT/ML FlexPen, USE UP TO 50 units DAILY in THE event pump fails (Patient not taking: Reported on 11/23/2021), Disp: 15 mL, Rfl: 5   triamcinolone (KENALOG) 0.025 % cream, Apply 1 application topically 2 (two) times daily. (Patient not taking: Reported on 07/20/2021), Disp: 240 g, Rfl: 3   Urine Glucose-Ketones Test STRP, If urine output and extreme thirst increases, collect urine and check with strip as directed. Disp 1 vial (Patient not taking: Reported on 06/29/2020), Disp: 20 each, Rfl: 3  Allergies as of 01/17/2022 - Review Complete 11/23/2021  Allergen Reaction Noted   Other  03/22/2020     reports that he has never smoked. He has never used smokeless tobacco. He reports that he does not drink alcohol. Pediatric History  Patient Parents   Vaughan [name] (Mother)   Other Topics Concern   Not on file  Social History Narrative   Lives with Mom and Sister (17y).  Mom quit smoking 18 years ago.  She has 2 other grown children.  They have a dog at home.  No recent travel.   1. School and Family: The family completed his education program. He has health insurance through [facility], but that insurance may be changing. Mom has health insurance as well. Family moved to [place] on 05/24/19. Mom went to [place] for care to see if she could find a doctor in [place] who would take care of Johnny [name],  but was unsuccessful. I told her that I will help her find  a new doctor in [place] if she can no longer bring Johnny [name] to see me. .  2. Activities: His play and video games.  3. Primary Care Provider: His PCP site is [facility], [phone].  4. Psychiatrist: None at present  REVIEW OF SYSTEMS: There are no other significant problems involving Johnny [name]'s other body systems.   Objective:   Wt Readings from Last 3 Encounters:  10/17/21 160 lb 12.8 oz (72.9 kg)  07/20/21 157 lb 9.6 oz (71.5 kg)  04/12/21 155 lb (70.3 kg)     Ht Readings from Last 3 Encounters:  04/12/21 '5\' 7"'  (1.702 m)  06/29/20 5' 7.32" (1.71 m)  06/06/20 '5\' 6"'  (1.676 m)    HC Readings from Last 3 Encounters:  No data found for [facility]   Facility age limit for growth %iles is 20 years.  There is no height or weight on file to calculate BMI. No height and weight on file for this encounter.  There is no height or weight on file to calculate BSA.   Constitutional:  Johnny [name] looks good today. He is bright and alert, more mature and interactive, very pleasant and friendly, but still significantly cognitively disabled.  Eyes: There is no arcus or proptosis.   LAB DATA:   Labs 10/17/21: HbA1c 8.3%, CBG 121  Labs 07/20/21: HbA1c 8.7%, CBG 242; TSH 1.86, free T4 1.1, free T3 3.7; CMP normal , except glucose 175; cholesterol 116, triglycerides 100, HDL 44, LDL 53; urinary microalbumin/creatinine ratio 3  Labs 04/12/21: HbA1c 8.7%, CBG 258  Labs 01/05/21: CBG 274  Labs 12/02/20: HbA1c 8.6%, CBG 348  Labs 09/29/20: HbA1c 8.6%, CBG 193  Labs 06/29/20: HbA1c 9.2%, CBG 220; TSH 1.37, free T4 1.1, free T3 3.9; CMP normal, except glucose 181 and CO2 33; urinary microalbumin/creatinine 5  Labs 04/14/19: HbA1c 8.2%, CBG 307  Labs 03/31/19: TSH 1.20, free T4 1.1, free T3 3.6; CMP normal; cholesterol 111, triglycerides 94, HDL 44, LDL 94;  microalbumin/creatinine ratio 3  Labs 08/07/18: HbA1c 8.8%, CBG 204  Labs  05/07/18: HbA1c 9.0%, CBG 171  Labs 04/16/18: TSH 2.081  Labs 04/15/18: venous pH 7.07; serum CO2 9, creatinine 1.49; Urine glucose >500 and ketones 80  Labs 02/07/18: HbA1c 10.0%  Labs 02/05/18: CBG 251  Labs 12/03/17: CBG 334  Labs 09/30/17: HbA1c 8.7%, CBG 254  Labs 07/24/17: CBG 263; TSH 2.18, free T4 1.1, free T3 3.3; urine microalbumin/creatinine ration 2; CMP normal except glucose 263  Labs 05/07/17: HbA1c 9.3%, CBG 246  Labs 03/04/17: CBG 494. He had not bolused after breakfast due to mom putting in a new pod just before they left home to drive to his appointment today.  Labs 12/26/16: HbA1c 10.2%, CBG 289  Labs 09/25/16: HbA1c 9.5%, CBG 261  Labs 06/27/16: CBG 114; TSH 1.54, free T4 1.1, free T3 3.4; CMP normal; urine microalbumin/creatinine ratio 2; cholesterol 108, triglycerides 153, HDL 43, LDL 34  Labs 04/10/16: HbA1c 9.5%, CBG 145  Labs 02/01/16: CBG 391, TSH 1.72  Labs 12/21/15: HbA1c 8.4%  Labs 10/10/15: HbA1c 8.2%.  Labs 08/09/15: HbA1c 8.5%  Labs 1012/16: HbA1c 8.8%; TSH 2.624, free T4 0.99, free T3 3.4; urine microalbumin/creatinine ratio <0.2; CMP normal  Labs 01/27/15: HbA1c 8.8%  Labs 10/25/14: Hemoglobin A1c 8.2%, compared with 7.9% at the last visit and with 8.4% at the prior visit.    Labs 10/22/13: CMP normal; C-peptide 0.65, increased from 0.19 at diagnosis (normal 0.80-3.90); TSH 1.265, free T4 1.01, free T3 3.4, TPO antibody 10.4  Assessment and Plan:   ASSESSMENT:  1-3. T1DM/hypoglycemia/hypoglycemia unawareness  A. Since exiting the honeymoon period, Lui has required more exogenous insulin than he needed before. His BGs have been much more variable over time and he has needed multiple pump adjustments over time    B. His HbA1c was somewhat lower at his visit in March 2023. Mom was still very afraid of [place] having low BGs, so she still tends to wait until after meals to give insulin and often tends to underdose insulin if she worries that his BGs  might drop. He certainly needs higher basal rates in the evenings and early mornings. E. Johnny [name] is doing a very good job overall of taking care of Johnny [name].     4-5. Goiter/thyroiditis:   A. He has the personal history of autoimmune T1DM. He also has the family history of autoimmune thyroid disease.  B. His thyroid gland is still enlarged today. His pattern of waxing and waning of thyroid gland size and thyroid lobe sizes is c/w evolving Hashimoto's thyroiditis. It is very likely that he will become hypothyroid in the future.   C.  He was euthyroid in April 2015, in October 2016, in January 2019, in September 2019, in September 2020, in December 2021, and again in December 2022. He is clinically euthyroid now.   6. Adjustment reaction: Things are going pretty well overall. Mom is trying very hard to take good care of Johnny [name].  7. Autism/ADHD/intellectual disability: Johnny [name]'s autism has improved somewhat in that he is more social and outgoing. However, he remains completely dependent upon mom for his meals, shelter, clothing, recreational activities, physical activities, and T1DM care. 8. Peripheral neuropathy: His neuropathy was not evident at his last clinic visit, in December 2020, in May 2021, in December 2021, in March 2022, in June 2022, in September 2022, in December 2022, and in March 2023.  He says he is not having any symptoms today.   PLAN:  1. Diagnostic: We reviewed his BG and Dexcom readings. 2. Therapeutic: I changes several basal rates. Continue the Small bedtime snack. Give a correction bolus before meals and a food bolus after meals. I  Change basal rates: MN: 1.1 -> 1.2 4  AM: New 1.1 New 6 PM: 1.2  Continue ISFs:  MN: 50 8 AM: 45 10 PM: 50  Continue BG targets: MN: 180 6 AM: 150 9 PM: 180  Continue ICRs: MN: 18 8 AM: 15 Noon: 15 10 PM 18  3. Patient education: We discussed his elevated BGs, his hypoglycemia, and all of the above at length. I again asked mom to  call me if Johnny [name] has any BGs during the night or in the mornings that are <80 and unexplained. I also asked her to keep a log of unusual high BGs or lower BGs and the causes. We discussed possibly seeking out an adult endocrinologist at [facility] for [place] in [place]. For the present, however, mom wants to bring [place] back to see me.   4. Follow-up: 1 month appointment.  Level of Service: This visit lasted in excess of 55 minutes. More than 50% of the visit was devoted to counseling.   Tillman Sers, MD, CDE Adult and Pediatric Endocrinology   This is a [facility] E-Visit follow up provided via My Chart Johnny [name] and his mother, Johnny [name], consented to an E-Visit consult today.  Location of patient: Johnny [name] and his mother are at their home.  Location of provider: Tillman Sers, MD is  at his office. Patient was referred by [facility]  The following participants were involved in this E-Visit: Johnny [name], Johnny [name]. And Dr. Tobe Sos  This visit was done via [place] Complain/ Reason for E-Visit today: T1DM and hypoglycemia Total time on call: 40 minutes Follow up: 4 weeks

## 2022-01-17 ENCOUNTER — Telehealth (INDEPENDENT_AMBULATORY_CARE_PROVIDER_SITE_OTHER): Payer: Medicaid Other | Admitting: "Endocrinology

## 2022-01-17 ENCOUNTER — Encounter (INDEPENDENT_AMBULATORY_CARE_PROVIDER_SITE_OTHER): Payer: Self-pay | Admitting: "Endocrinology

## 2022-01-17 DIAGNOSIS — E11649 Type 2 diabetes mellitus with hypoglycemia without coma: Secondary | ICD-10-CM

## 2022-01-17 NOTE — Patient Instructions (Addendum)
Follow up visit in one month.  ° °At [facility], we are committed to providing exceptional care. You will receive a patient satisfaction survey through text or email regarding your visit today. Your opinion is important to me. Comments are appreciated. ° °

## 2022-01-19 ENCOUNTER — Telehealth (INDEPENDENT_AMBULATORY_CARE_PROVIDER_SITE_OTHER): Payer: Self-pay | Admitting: Pharmacist

## 2022-01-19 NOTE — Telephone Encounter (Signed)
Who's calling (name and relationship to patient) : [name]; mom  Best contact number: [phone]  Provider they see: Taylor/ Fransico Michael  Reason for call: Mom has called in wanting to speak with Ladona Ridgel regarding questions about the Omnipod. She has requested a call back or a Mychart message.   Call ID:      PRESCRIPTION REFILL ONLY  Name of prescription:  Pharmacy:

## 2022-01-19 NOTE — Telephone Encounter (Signed)
Called and spoke to mom to get an idea of what questions she has for Dr. Ladona Ridgel. Mom stated that when she had a visit a couple of days ago with Dr. Fransico Michael, he has mentioned changing some of the rates on the pump. Mom was unsure what the rates should be as well as doesn't know how to change this. Mom stated that she was told to call our office to speak to Dr. Ladona Ridgel to get instructions on how to do this and mom stated that Dr. Ladona Ridgel has done this in the past as well. I relayed to mom that I will send this message to Dr. Ladona Ridgel for further assistance. Mom was grateful for the return call and had no other questions.

## 2022-01-22 NOTE — Telephone Encounter (Signed)
Contacted patient via Mychart

## 2022-02-01 ENCOUNTER — Telehealth (INDEPENDENT_AMBULATORY_CARE_PROVIDER_SITE_OTHER): Payer: Medicaid Other | Admitting: Pharmacist

## 2022-02-07 ENCOUNTER — Telehealth (INDEPENDENT_AMBULATORY_CARE_PROVIDER_SITE_OTHER): Payer: Medicaid Other | Admitting: Pharmacist

## 2022-02-07 DIAGNOSIS — E1065 Type 1 diabetes mellitus with hyperglycemia: Secondary | ICD-10-CM

## 2022-02-07 NOTE — Progress Notes (Addendum)
This is a Pediatric Specialist E-Visit (My Chart Video Visit) follow up consult provided via [place] and [name] consented to an E-Visit consult today.  Location of patient: Johnny [name] and [name] at home  Location of provider: Drexel Iha, PharmD, BCACP, CDCES, CPP is at office.    S:     Chief Complaint  Patient presents with   Diabetes    Pump Follow Up    Endocrinology provider: Dr. Tobe Sos (upcoming appt 03/14/22 2:15 pm)  Patient referred to me by Dr. Tobe Sos for insulin pump initiation and training as well as .PMH significant for T1DM, chronic allergic rhinitis, goiter, acne vulgaris, atopic dermaitis, ADHD, autistic disorder. Patient wears an Omnipod 5 insulin pump and Dexcom G6 CGM. Patient was started the Omnipod 5  insulin pump on 11/23/2021.   I connected with Einar Grad and his mother [name]) on 02/08/2022 by video and verified that I am speaking with the correct person using two identifiers. Follow up appt was originally scheduled for assistance on entering basal dose changes Dr. Tobe Sos advised at prior appt on 01/17/2022. However, since then mother reports having issues with the Omnipod 5; it lost connect to the Dexcom G6 CGM. She transitioned back to MDI. Mom reports she administered Lantus 4 units around 10 pm and 29 units around 12:40 am on 02/06/2022. She recently changed his Education officer, community. She states His most up to date transmitter code [id number] - she only updated the code in [place] phone (not Omnipod 5 PDM).  Insurance: [place] [place], [place] - [address]  [address], [place]  [address]  Phone:  [phone]  Fax:  [phone]  DEA #:  [id number]  DAW Reason: --   Omnipod 5 Pump Settings   Basal (Max: 2 units/hr) 12AM 1.1                           Total: 26.4 units   Insulin to carbohydrate ratio (ICR)  12AM 18  8AM 15   2PM 15  5PM 15  10PM 18       Max Bolus: 10 units   Insulin Sensitivity Factor (ISF) 12AM 50  8AM 45  10PM 50                     Target and Correct Above BG 12AM 150  8AM 130  10PM 150                   Temp basal: ON Duration of insulin action: 3 hours  Extended bolus: ON Reverse Correction: OFF   O:   Labs:    [place] Report    There were no vitals filed for this visit.  HbA1c Lab Results  Component Value Date   HGBA1C 8.3 (A) 10/17/2021   HGBA1C 8.7 (A) 07/20/2021   HGBA1C 8.7 (A) 04/12/2021    Pancreatic Islet Cell Autoantibodies Lab Results  Component Value Date   ISLETAB 80 (A) 06/26/2013    Insulin Autoantibodies Lab Results  Component Value Date   INSULINAB <0.4 06/26/2013    Glutamic Acid Decarboxylase Autoantibodies Lab Results  Component Value Date   GLUTAMICACAB >30.0 (H) 06/26/2013    ZnT8 Autoantibodies No results found for: "ZNT8AB"  IA-2 Autoantibodies No results found for: "LABIA2"  C-Peptide Lab Results  Component  Value Date   CPEPTIDE 0.64 (L) 10/22/2013    Microalbumin Lab Results  Component Value Date   MICRALBCREAT 3 07/20/2021    Lipids    Component Value Date/Time   CHOL 116 07/20/2021 1521   TRIG 100 07/20/2021 1521   HDL 44 07/20/2021 1521   CHOLHDL 2.6 07/20/2021 1521   VLDL 31 (H) 06/27/2016 0001   LDLCALC 53 07/20/2021 1521    Assessment: Diabetes Management - Assisted mother with entering basal rate changes advised by Dr. Fransico Michael at prior appt.  Pump Education - Discussed with mother when changing transmitter code it must be changed on the Dexcom G6 app and Omnipod 5 controller; assisted her with updating this. We also discussed when he eats high fat meals or snacks (chocolate ice cream) he can try bolusing for 50% of carbs at onset of eating and 50% of the remaining carbs 1 hour later to mimic an extended bolus (not available in auto mode of Omnipod 5). We also thoroughly reviewed his  pump back up plan (mother downloaded boluscalc on her phone and we went through multiple examples to practice). Also, assisted mother with requesting Dexcom G6 from [facility] technical support and advised her to contact Omnipod customer support for replacement of pods.  Plan: Insulin pump settings:  Basal (Max: 2 units/hr) 12AM 1.1  4AM 1.2  6PM                   Total: 26.4 units --> 27.4  Pump Education:  Reviewed how to change transmitter code in Omnipod 5 Reviewed different bolusing strategies for high fat content meals or snacks Reviewed pump back up plan Also, assisted mother with requesting Dexcom G6 from [facility] technical support and advised her to contact Omnipod customer support for replacement of pods. Monitoring:  Continue wearing Dexcom G6 CGM Jehu Mccauslin has a diagnosis of diabetes, checks blood glucose readings > 4x per day, wears an insulin pump, and requires frequent adjustments to insulin regimen. This patient will be seen every six months, minimally, to assess adherence to their CGM regimen and diabetes treatment plan. Follow Up: 04/25/22   Written patient instructions provided and emailed to [email or web address].    If your pump breaks, your long acting insulin dose would be Lantus 26 units daily. You would do the following equation for your Novolog on bolus calc (remember to press simple insulin bolus and click on specific meal:  Novolog total dose = food dose + correction dose Food dose: total carbohydrates divided by insulin carbohydrate ratio (ICR) Your ICR is 15 for breakfast, 15 for lunch, and 15 for dinner Correction dose: (current blood sugar - target blood sugar) divided by insulin sensitivity factor (ISF) Your ISF is 45 during the day and 50 at night (after dinner) Your target blood sugar is 120 during the day and 200 at night.  PLEASE REMEMBER TO CONTACT OFFICE IF YOU ARE AT RISK OF RUNNING OUT OF PUMP SUPPLIES, INSULIN PEN SUPPLIES, OR IF YOU WANT TO  KNOW WHAT YOUR BACK UP INSULIN PEN DOSES ARE.      This appointment required 60 minutes of patient care (this includes precharting, chart review, review of results, virtual care, etc.).  Time Spent 01/20/22 - 02/19/22: 60 minutes -02/08/22:60 minutes (billed [phone])  Thank you for involving clinical pharmacist/diabetes educator to assist in providing this patient's care.  Zachery Conch, PharmD, BCACP, CDCES, CPP  I have reviewed the following documentation and am in agreeance with the plan. I was immediately available to  the clinical pharmacist for questions and collaboration.  [name], MD, CDCES

## 2022-02-22 ENCOUNTER — Encounter (INDEPENDENT_AMBULATORY_CARE_PROVIDER_SITE_OTHER): Payer: Self-pay

## 2022-03-14 ENCOUNTER — Telehealth (INDEPENDENT_AMBULATORY_CARE_PROVIDER_SITE_OTHER): Payer: Self-pay

## 2022-03-14 ENCOUNTER — Ambulatory Visit (INDEPENDENT_AMBULATORY_CARE_PROVIDER_SITE_OTHER): Payer: Medicaid Other | Admitting: "Endocrinology

## 2022-03-14 ENCOUNTER — Encounter (INDEPENDENT_AMBULATORY_CARE_PROVIDER_SITE_OTHER): Payer: Self-pay | Admitting: "Endocrinology

## 2022-03-14 VITALS — BP 98/70 | HR 102 | Wt 155.0 lb

## 2022-03-14 DIAGNOSIS — F840 Autistic disorder: Secondary | ICD-10-CM | POA: Diagnosis not present

## 2022-03-14 DIAGNOSIS — E10649 Type 1 diabetes mellitus with hypoglycemia without coma: Secondary | ICD-10-CM

## 2022-03-14 DIAGNOSIS — E063 Autoimmune thyroiditis: Secondary | ICD-10-CM

## 2022-03-14 DIAGNOSIS — E1065 Type 1 diabetes mellitus with hyperglycemia: Secondary | ICD-10-CM

## 2022-03-14 DIAGNOSIS — E1042 Type 1 diabetes mellitus with diabetic polyneuropathy: Secondary | ICD-10-CM

## 2022-03-14 DIAGNOSIS — E049 Nontoxic goiter, unspecified: Secondary | ICD-10-CM | POA: Diagnosis not present

## 2022-03-14 LAB — POCT GLYCOSYLATED HEMOGLOBIN (HGB A1C): Hemoglobin A1C: 7.9 % — AB (ref 4.0–5.6)

## 2022-03-14 LAB — POCT GLUCOSE (DEVICE FOR HOME USE): POC Glucose: 168 mg/dl — AB (ref 70–99)

## 2022-03-14 NOTE — Patient Instructions (Signed)
No further follow up.   At [facility], we are committed to providing exceptional care. You will receive a patient satisfaction survey through text or email regarding your visit today. Your opinion is important to me. Comments are appreciated.  

## 2022-03-14 NOTE — Telephone Encounter (Signed)
  Name of who is calling: Army Melia Relationship to Patient: Mother  Best contact number: [phone]  Provider they see: Fransico Michael  Reason for call: Mother is requesting for medications to be refilled so they have enough available until patient can get established with another  endocrinologist.     PRESCRIPTION REFILL ONLY  Name of prescription: All Rx prescribed by Dr. Fransico Michael  Pharmacy: [facility]

## 2022-03-14 NOTE — Progress Notes (Signed)
Subjective:  Patient Name: Johnny [name] Date of Birth: 1995-09-23  MRN: [id number]  Johnny [name]  presents at today's clinic visit for follow-up evaluation and management  of his T1DM, hypoglycemia, autism, ADHD, possible mental retardation, goiter, thyroiditis, adjustment reaction, and family history of autoimmune thyroid disease.  HISTORY OF PRESENT ILLNESS:   Johnny [name] is a 26 y.o. Caucasian young man.   Johnny [name] was accompanied by his mother, Ms. [name], and his sister.  45. Kesean was seen at [facility] in [place], [place], on 06/26/13 for complaints of weight loss, fatigue, personality change, polyuria, polydipsia, and vomiting. New-onset DM and DKA were diagnosed. He received fluids at the ED at [facility], was transferred to [facility], and was admitted to the PICU for evaluation and management of new-onset T1DM, DKA, dehydration, and ketonuria in the setting of pre-existing autism, ADHD, and possible mental retardation. Significantly abnormal lab results included: Venous pH 7.257, serum potassium 3.0, serum CO2 15, serum glucose 257, hemoglobin A1c 13.7%, C-peptide 0.19 (normal 0.80-3.09), anti-glutamic acid decarboxylase (GAD) antibody > 30 (normal < 1.0), anti-islet cell antibody 80 (normal < 5). TSH was 2.163, free T4 0.82, free T3 1.8 (normal 2.3-4.2). He was treated initially with infusions of insulin and fluids. When his DKA resolved, he was converted to a multiple daily injection (MDI) insulin plan, using Lantus as a basal insulin and Novolog aspart as a bolus insulin at mealtimes, bedtime, and 2 AM if needed. 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   2. Clinical course: A. Johnny [name] was discharged from [facility] on 07/04/13. He and his mother participated in our [place] education program with Ms. [name] Eaton, RN. 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 When he went into the  honeymoon period his Lantus dose was decreased to 6 units at night. Since then, however, he has exited the honeymoon period and has had a gradual but progressive increase in his exogenous insulin requirement. He started his Dexcom G5 CGM in early 2016. He was converted to an Omnipod pump on 01/16/16. He was subsequently converted to a Dexcom G6.  B. Johnny [name] had covid in July 2022. He has fully recovered.   3. Johnny [name] tele[facility] occurred on 01/17/22. At that visit I changed one basal rate settings. He has since started on his new Omnipod 5 pump and Dexcom G6.  A. In the interim he has been healthy. His allergies continue to be better since moving to [place], [place].  B. He still has a fine, punctate rash on his neck, back and arms. He has not yet seen a dermatologist.   C. The Dexcom G6 is working "great". His Omnipod 5 is also working well.  D. BG discussion: 1). He has been eating a series of prepared meals that were delivered to the house and that he eats about 6 PM. As a result, his BGs are better when he eats those meals. BGs were higher around his birthday on [birth year]. Marland Kitchen   2). Johnny [name] has had 2-3 low BGs. Mom still adjusts his boluses to try to compensate for planned physical activity ands to avoid causing hypoglycemia.    D. Although I have asked mom to give him his correction bolus before the meal and his food bolus after the meal, she continues to wait until after the meal. She is afraid that his BGs will drop if he does not eat all  of the carbs that she planned.     Johnny [name] has still not had the covid or flu vaccinations. Neither has mom. I encouraged both of them to get the vaccine.    G. His appetite is good. Mom still controls the number of carbs he takes in. His step-father is not as careful.  F. Johnny [name]'s speech continues to improve and he talks more. However, he still remains completely dependent on his mother for all of his basic needs. He can,  however, forage for food at home.    4. [name]:  Constitutional: Eyad feels "good".   Eyes: Vision seems to be good when he wears his glasses. He had his last eye appointment 3 months ago. The eye doctor did not see any signs of DM damage.  Neck: Anthany has not had any pains or swelling in the front part of his neck. There are no other recognized problems of the anterior neck.  Heart: There are no recognized heart problems. The ability to perform physical activities seems normal for his limited  level of physical activity.  Gastrointestinal: He is still hungry. Mom says his belly is getting bigger. He no longer has postprandial bloating. Bowel movents seem normal. There are no recognized GI problems. Hands: No problems Legs: Muscle mass and strength seem normal. He can perform his usual ADLs and other physical activities without obvious discomfort. He has no numbness, tingling, burning, or pains. No edema is noted.  Feet: His feet are dry. No edema is noted.  Neurologic: There are no recognized problems with muscle movement and strength, sensation, or coordination. Hypoglycemia: Mom says that he has had fewer low BGs.   Skin: His rash persists.   5. Pump printout: Time in Range was 64%. Time above range was 36%.    6. CGM printout: We have data from the past 2 weeks.  A. Average SG is 198, compared with 19 at his last visit and with 244 at his prior visit. SGs average about 205 at midnight, 145 at breakfast, 180 at lunch, 150 at dinner, and 230 at bedtime. His SG range is about 80->400, compared with 75-360 at his last visit. . Most of his higher SGs occur after lunch and after dinner. His SGs vary with carb counts, timing of insulin boluses, and whether or not mom gives the insulin before or after meals.  B. His Time in Range was 44:, time above range was 56%. He needs to take the correction doses before meals.   PAST MEDICAL, FAMILY, AND SOCIAL HISTORY    Past Medical  History:  Diagnosis Date   ADHD (attention deficit hyperactivity disorder)    Allergic rhinitis    Autism    Diabetes mellitus without complication ([place])     Family History  Problem Relation Age of Onset   Hypothyroidism Mother    Kidney disease Mother        Mom unsure what her kidney issues are   Autism Sister    Mental retardation Sister    Cancer Maternal Grandmother    Cancer Maternal Grandfather    Diabetes Paternal Grandmother        Died at age 25.   Other Other        Paternal great aunt with "hypoglycemia" unsure why     Current Outpatient Medications:    Accu-Chek FastClix Lancets MISC, USE TO check 10 times DAILY, Disp: 300 each, Rfl: 5   Blood Glucose Monitoring Suppl ([place] ME)  w/Device KIT, Use to check blood sugar 6 times per day, Disp: 1 kit, Rfl: 1   Continuous Blood Gluc Sensor (DEXCOM G6 SENSOR) MISC, CHANGE every 10 DAYS as directed, Disp: 3 each, Rfl: 11   Continuous Blood Gluc Transmit (DEXCOM G6 TRANSMITTER) MISC, USE AS DIRECTED CHANGE EVERY 90 DAYS, Disp: 1 each, Rfl: 5   diphenhydrAMINE (BENADRYL) 25 MG tablet, Take 50 mg by mouth every 6 (six) hours as needed for itching or allergies., Disp: , Rfl:    glucose blood (ACCU-CHEK GUIDE) test strip, Use to check blood sugar up to 10 times per day, Disp: 300 each, Rfl: 5   Insulin Disposable Pump (OMNIPOD 5 G6 POD, GEN 5,) MISC, Inject 1 Device into the skin as directed. Change pod every 2 days. Patient will need 3 boxes (each contain 5 pods) for a 30 day supply. Please fill for [facility] [id number]., Disp: 15 each, Rfl: 5   Insulin Pen Needle (PEN NEEDLES) 32G X 4 MM MISC, Use with insulin pen up to 6x per day, Disp: 200 each, Rfl: 11   LANTUS SOLOSTAR 100 UNIT/ML Solostar Pen, USE UP TO 50 units DAILY in THE event pump fails, Disp: 15 mL, Rfl: 5   NOVOLOG 100 UNIT/ML injection, administer 200 units via isulin pump EVERY 48 hours, Disp: 40 mL, Rfl: 5   NOVOLOG FLEXPEN 100 UNIT/ML FlexPen, USE UP TO 50  units DAILY in THE event pump fails, Disp: 15 mL, Rfl: 5   acetone, urine, test strip, 1 strip by Does not apply route as needed for high blood sugar. (Patient not taking: Reported on 07/20/2021), Disp: 200 strip, Rfl: 6   cetirizine (ZYRTEC) 10 MG tablet, Take 1 tablet (10 mg total) by mouth daily. (Patient not taking: Reported on 07/20/2021), Disp: 90 tablet, Rfl: 3   Continuous Blood Gluc Receiver (DEXCOM G6 RECEIVER) DEVI, USE AS DIRECTED (Patient not taking: Reported on 11/23/2021), Disp: 1 each, Rfl: 0   glucagon (GLUCAGEN HYPOKIT) 1 MG SOLR injection, Use in instances of severe low glucose, or if unresponsive (Patient not taking: Reported on 04/12/2021), Disp: 2 each, Rfl: 5   triamcinolone (KENALOG) 0.025 % cream, Apply 1 application topically 2 (two) times daily. (Patient not taking: Reported on 07/20/2021), Disp: 240 g, Rfl: 3   Urine Glucose-Ketones Test STRP, If urine output and extreme thirst increases, collect urine and check with strip as directed. Disp 1 vial (Patient not taking: Reported on 06/29/2020), Disp: 20 each, Rfl: 3  Allergies as of 03/14/2022 - Review Complete 03/14/2022  Allergen Reaction Noted   Other  03/22/2020     reports that he has never smoked. He has never used smokeless tobacco. He reports that he does not drink alcohol. Pediatric History  Patient Parents   [name] (Mother)   Other Topics Concern   Not on file  Social History Narrative   Lives with Mom and Sister (17y).  Mom quit smoking 18 years ago.  She has 2 other grown children.  They have a dog at home.  No recent travel.   1. School and Family: The family completed his education program. He has health insurance through [facility], but that insurance may be changing. Mom has health insurance as well. Family moved to [place] on 05/24/19. Mom went to [place] for care to see if she could find a doctor in [place] who would take care of Revel, but was unsuccessful. I told her that I will try to help  her find a new doctor  in [place] if she can no longer bring Ry[place] to see me. .  2. Activities: His play and video games.  3. Primary Care Provider: His PCP site is [facility], [phone].  4. Psychiatrist: None at present  REVIEW OF SYSTEMS: There are no other significant problems involving Winferd's other body systems.   Objective:   BP 98/70   Pulse (!) 102   Wt 155 lb (70.3 kg)   BMI 24.28 kg/m    Wt Readings from Last 3 Encounters:  03/14/22 155 lb (70.3 kg)  10/17/21 160 lb 12.8 oz (72.9 kg)  07/20/21 157 lb 9.6 oz (71.5 kg)     Ht Readings from Last 3 Encounters:  04/12/21 '5\' 7"'  (1.702 m)  06/29/20 5' 7.32" (1.71 m)  06/06/20 '5\' 6"'  (1.676 m)   Body mass index is 24.28 kg/m. Facility age limit for growth %iles is 20 years.  Body surface area is 1.82 meters squared.   Constitutional:  Boone looks good today. He is bright and alert, more mature and interactive, very pleasant and friendly, but still significantly cognitively disabled. His weight has decreased 5 pounds in 5 months  Eyes: There is no arcus or proptosis.  Mouth: The oropharynx appears normal. The tongue appears normal. There is normal oral moisture. There is no obvious gingivitis. Neck: There are no bruits present. The thyroid gland appears normal in size. The thyroid gland is symmetrically enlarged at approximately 21 grams in size. The consistency of the thyroid gland is relatively full. There is no thyroid tenderness to palpation. Lungs: The lungs are clear. Air movement is good. Heart: The heart rhythm and rate appear normal. Heart sounds S1 and S2 are normal. I do not appreciate any pathologic heart murmurs. Abdomen: The abdominal size is normal. Bowel sounds are normal. The abdomen is soft and non-tender. There is no obviously palpable hepatomegaly, splenomegaly, or other masses.  Arms: Muscle mass appears appropriate for age.  Hands: There is no obvious tremor. Phalangeal and  metacarpophalangeal joints appear normal. Palms are normal. Legs: Muscle mass appears appropriate for age. There is no edema.  Feet: There are no significant deformities. Dorsalis pedis pulses are normal 1+ bilaterally.  Neurologic: Muscle strength is normal for age and gender  in both the upper and the lower extremities. Muscle tone appears normal. Sensation to touch is normal in the legs and feet.  LAB DATA:  Labs 03/14/22: HbA1c 7.9%, CBG 168  Labs 10/17/21: HbA1c 8.3%, CBG 121  Labs 07/20/21: HbA1c 8.7%, CBG 242; TSH 1.86, free T4 1.1, free T3 3.7; CMP normal , except glucose 175; cholesterol 116, triglycerides 100, HDL 44, LDL 53; urinary microalbumin/creatinine ratio 3  Labs 04/12/21: HbA1c 8.7%, CBG 258  Labs 01/05/21: CBG 274  Labs 12/02/20: HbA1c 8.6%, CBG 348  Labs 09/29/20: HbA1c 8.6%, CBG 193  Labs 06/29/20: HbA1c 9.2%, CBG 220; TSH 1.37, free T4 1.1, free T3 3.9; CMP normal, except glucose 181 and CO2 33; urinary microalbumin/creatinine 5  Labs 04/14/19: HbA1c 8.2%, CBG 307  Labs 03/31/19: TSH 1.20, free T4 1.1, free T3 3.6; CMP normal; cholesterol 111, triglycerides 94, HDL 44, LDL 94;  microalbumin/creatinine ratio 3  Labs 08/07/18: HbA1c 8.8%, CBG 204  Labs 05/07/18: HbA1c 9.0%, CBG 171  Labs 04/16/18: TSH 2.081  Labs 04/15/18: venous pH 7.07; serum CO2 9, creatinine 1.49; Urine glucose >500 and ketones 80  Labs 02/07/18: HbA1c 10.0%  Labs 02/05/18: CBG 251  Labs 12/03/17: CBG 334  Labs 09/30/17: HbA1c 8.7%, CBG 254  Labs 07/24/17: CBG 263; TSH 2.18, free T4 1.1, free T3 3.3; urine microalbumin/creatinine ration 2; CMP normal except glucose 263  Labs 05/07/17: HbA1c 9.3%, CBG 246  Labs 03/04/17: CBG 494. He had not bolused after breakfast due to mom putting in a new pod just before they left home to drive to his appointment today.  Labs 12/26/16: HbA1c 10.2%, CBG 289  Labs 09/25/16: HbA1c 9.5%, CBG 261  Labs 06/27/16: CBG 114; TSH 1.54, free T4 1.1, free T3 3.4; CMP  normal; urine microalbumin/creatinine ratio 2; cholesterol 108, triglycerides 153, HDL 43, LDL 34  Labs 04/10/16: HbA1c 9.5%, CBG 145  Labs 02/01/16: CBG 391, TSH 1.72  Labs 12/21/15: HbA1c 8.4%  Labs 10/10/15: HbA1c 8.2%.  Labs 08/09/15: HbA1c 8.5%  Labs 1012/16: HbA1c 8.8%; TSH 2.624, free T4 0.99, free T3 3.4; urine microalbumin/creatinine ratio <0.2; CMP normal  Labs 01/27/15: HbA1c 8.8%  Labs 10/25/14: Hemoglobin A1c 8.2%, compared with 7.9% at the last visit and with 8.4% at the prior visit.    Labs 10/22/13: CMP normal; C-peptide 0.65, increased from 0.19 at diagnosis (normal 0.80-3.90); TSH 1.265, free T4 1.01, free T3 3.4, TPO antibody 10.4    Assessment and Plan:   ASSESSMENT:  1-3. T1DM/hypoglycemia/hypoglycemia unawareness  A. Since exiting the honeymoon period, Wayden has required more exogenous insulin than he needed before. His BGs have been much more variable over time and he has needed multiple pump adjustments over time    B. His HbA1c was somewhat lower at his visit in March 2023. Mom was still very afraid of Zamarion having low BGs, so she still tends to wait until after meals to give insulin and often tends to underdose insulin if she worries that his BGs might drop.   C. His HbA1c decreased even further in August 2023.  D. He is not having any documented hypoglycemia.  E. Ms. Drema Dallas is doing a very good job overall of taking care of Johnny [name].     4-5. Goiter/thyroiditis:   A. He has the personal history of autoimmune T1DM. He also has the family history of autoimmune thyroid disease.  B. His thyroid gland is still enlarged today. His pattern of waxing and waning of thyroid gland size and thyroid lobe sizes is c/w evolving Hashimoto's thyroiditis. It is very likely that he will become hypothyroid in the future.   C.  He was euthyroid in April 2015, in October 2016, in January 2019, in September 2019, in September 2020, in December 2021, and again in December 2022. He is  clinically euthyroid now.   6. Adjustment reaction: Things are going pretty well overall. Mom is trying very hard to take good care of Johnny [name].  7. Autism/ADHD/intellectual disability: Johnny [name]'s autism has improved somewhat in that he is more social and outgoing. However, he remains completely dependent upon mom for his meals, shelter, clothing, recreational activities, physical activities, and T1DM care. 8. Peripheral neuropathy: His neuropathy was not evident at his clinic visit in December 2020, in May 2021, in December 2021, in March 2022, in June 2022, in September 2022, in December 2022, and in March 2023.  He says he is not having any symptoms today.   PLAN:  1. Diagnostic: We reviewed his BG and Dexcom readings. 2. Therapeutic: I continued his insulin pump settings. I asked mom to give the correction boluses 15 minutes before the meal and give the food boluses as soon as practical after the meal.. Continue the Small bedtime snack.  Continue basal rates: MN: 1.2  4  AM: New 1.1 New 6 PM: 1.2  Continue ISFs:  MN: 50 8 AM: 45 10 PM: 50  Continue BG targets: MN: 180 6 AM: 150 9 PM: 180  Continue ICRs: MN: 18 8 AM: 15 Noon: 15 10 PM 18  3. Patient education: We discussed his elevated BGs, his hypoglycemia, and all of the above at length. I again asked mom to call me if Vishal has any BGs during the night or in the mornings that are <80 and unexplained. I also asked her to keep a log of unusual high BGs or lower BGs and the causes. We discussed possibly seeking out an adult endocrinologist for Mitzi Hansen at [facility] or at the [facility] in [place].  4. Follow-up: 3-6 months in [place]  Level of Service: This visit lasted in excess of 55 minutes. More than 50% of the visit was devoted to counseling.   Tillman Sers, MD, CDE Adult and Pediatric Endocrinology

## 2022-03-16 MED ORDER — LANTUS SOLOSTAR 100 UNIT/ML ~~LOC~~ SOPN: PEN_INJECTOR | SUBCUTANEOUS | 5 refills | Status: AC

## 2022-03-16 MED ORDER — NOVOLOG FLEXPEN 100 UNIT/ML ~~LOC~~ SOPN: PEN_INJECTOR | SUBCUTANEOUS | 5 refills | Status: AC

## 2022-03-16 MED ORDER — INSULIN ASPART 100 UNIT/ML IJ SOLN: INTRAMUSCULAR | 5 refills | Status: AC

## 2022-04-24 NOTE — Progress Notes (Unsigned)
This is a Pediatric Specialist E-Visit (My Chart Video Visit) follow up consult provided via [place] and Johnny [name] consented to an E-Visit consult today.  Location of patient: Johnny [name] and Johnny [name] at home  Location of provider: Drexel Iha, PharmD, BCACP, CDCES, CPP is at office.    S:     No chief complaint on file.   Endocrinology provider: Dr. Tobe Sos (upcoming appt 03/14/22 2:15 pm)  Patient referred to me by Dr. Tobe Sos for insulin pump initiation and training as well as .PMH significant for T1DM, chronic allergic rhinitis, goiter, acne vulgaris, atopic dermaitis, ADHD, autistic disorder. Patient wears an Omnipod 5 insulin pump and Dexcom G6 CGM. Patient was started the Omnipod 5  insulin pump on 11/23/2021.   I connected with Johnny [name] and his mother Johnny [name]) on 04/25/2022 by video***** and verified that I am speaking with the correct person using two identifiers. Follow up appt was originally scheduled for assistance on entering basal dose changes Dr. Tobe Sos advised at prior appt on 01/17/2022. However, since then mother reports having issues with the Omnipod 5; it lost connect to the Dexcom G6 CGM. She transitioned back to MDI. Mom reports she administered Lantus 4 units around 10 pm and 29 units around 12:40 am on 02/06/2022. She recently changed his Education officer, community. She states His most up to date transmitter code [id number] - she only updated the code in [place] phone (not Omnipod 5 PDM).  Insurance: [employer] [place], [place] - [address]  [address], [place] [address]  Phone:  [phone]  Fax:  [phone]  DEA #:  [id number]  DAW Reason: --   Omnipod 5 Pump Settings   Basal (Max: 2 units/hr) 12AM 1.1                           Total: 26.4 units   Insulin to carbohydrate ratio (ICR)  12AM 18  8AM 15  2PM 15  5PM 15  10PM 18       Max  Bolus: 10 units   Insulin Sensitivity Factor (ISF) 12AM 50  8AM 45  10PM 50                     Target and Correct Above BG 12AM 150  8AM 130  10PM 150                   Temp basal: ON Duration of insulin action: 3 hours  Extended bolus: ON Reverse Correction: OFF   O:   Labs:    [place] Report    There were no vitals filed for this visit.  HbA1c Lab Results  Component Value Date   HGBA1C 7.9 (A) 03/14/2022   HGBA1C 8.3 (A) 10/17/2021   HGBA1C 8.7 (A) 07/20/2021    Pancreatic Islet Cell Autoantibodies Lab Results  Component Value Date   ISLETAB 80 (A) 06/26/2013    Insulin Autoantibodies Lab Results  Component Value Date   INSULINAB <0.4 06/26/2013    Glutamic Acid Decarboxylase Autoantibodies Lab Results  Component Value Date   GLUTAMICACAB >30.0 (H) 06/26/2013    ZnT8 Autoantibodies No results found for: "ZNT8AB"  IA-2 Autoantibodies No results found for: "LABIA2"  C-Peptide Lab Results  Component Value Date   CPEPTIDE 0.64 (L) 10/22/2013  Microalbumin Lab Results  Component Value Date   MICRALBCREAT 3 07/20/2021    Lipids    Component Value Date/Time   CHOL 116 07/20/2021 1521   TRIG 100 07/20/2021 1521   HDL 44 07/20/2021 1521   CHOLHDL 2.6 07/20/2021 1521   VLDL 31 (H) 06/27/2016 0001   LDLCALC 53 07/20/2021 1521    Assessment: Diabetes Management - Assisted mother with entering basal rate changes advised by Dr. Fransico Michael at prior appt.  Pump Education - Discussed with mother when changing transmitter code it must be changed on the Dexcom G6 app and Omnipod 5 controller; assisted her with updating this. We also discussed when he eats high fat meals or snacks (chocolate ice cream) he can try bolusing for 50% of carbs at onset of eating and 50% of the remaining carbs 1 hour later to mimic an extended bolus (not available in auto mode of Omnipod 5). We also thoroughly reviewed his pump back up plan (mother downloaded  boluscalc on her phone and we went through multiple examples to practice). Also, assisted mother with requesting Dexcom G6 from [facility] technical support and advised her to contact Omnipod customer support for replacement of pods.  Plan: Insulin pump settings:  Basal (Max: 2 units/hr) 12AM 1.1  4AM 1.2  6PM                   Total: 26.4 units --> 27.4  Pump Education:  Reviewed how to change transmitter code in Omnipod 5 Reviewed different bolusing strategies for high fat content meals or snacks Reviewed pump back up plan Also, assisted mother with requesting Dexcom G6 from [facility] technical support and advised her to contact Omnipod customer support for replacement of pods. Monitoring:  Continue wearing Dexcom G6 CGM Mj Willis has a diagnosis of diabetes, checks blood glucose readings > 4x per day, wears an insulin pump, and requires frequent adjustments to insulin regimen. This patient will be seen every six months, minimally, to assess adherence to their CGM regimen and diabetes treatment plan. Follow Up: 04/25/22   Written patient instructions provided and emailed to [email or web address].    If your pump breaks, your long acting insulin dose would be Lantus 26 units daily. You would do the following equation for your Novolog on bolus calc (remember to press simple insulin bolus and click on specific meal:  Novolog total dose = food dose + correction dose Food dose: total carbohydrates divided by insulin carbohydrate ratio (ICR) Your ICR is 15 for breakfast, 15 for lunch, and 15 for dinner Correction dose: (current blood sugar - target blood sugar) divided by insulin sensitivity factor (ISF) Your ISF is 45 during the day and 50 at night (after dinner) Your target blood sugar is 120 during the day and 200 at night.  PLEASE REMEMBER TO CONTACT OFFICE IF YOU ARE AT RISK OF RUNNING OUT OF PUMP SUPPLIES, INSULIN PEN SUPPLIES, OR IF YOU WANT TO KNOW WHAT YOUR BACK UP INSULIN PEN  DOSES ARE.      This appointment required 60 minutes of patient care (this includes precharting, chart review, review of results, virtual care, etc.).  Time Spent 01/20/22 - 02/19/22: 60 minutes -02/08/22:60 minutes (billed [phone])  Thank you for involving clinical pharmacist/diabetes educator to assist in providing this patient's care.  Zachery Conch, PharmD, BCACP, CDCES, CPP

## 2022-04-25 ENCOUNTER — Telehealth (INDEPENDENT_AMBULATORY_CARE_PROVIDER_SITE_OTHER): Payer: Medicaid Other | Admitting: Pharmacist

## 2022-04-25 DIAGNOSIS — E1065 Type 1 diabetes mellitus with hyperglycemia: Secondary | ICD-10-CM

## 2022-04-25 NOTE — Progress Notes (Signed)
This is a Pediatric Specialist E-Visit follow up consult provided via telephone call. Johnny [name] and Johnny [name] consented to an E-Visit consult today.  Location of patient: [name] and Johnny [name] at home  Location of provider: Drexel Iha, PharmD, BCACP, CDCES, CPP is at office.    S:     Chief Complaint  Patient presents with   Diabetes    Follow Up    Endocrinology provider: Dr. Tobe Sos (upcoming appt 03/14/22 2:15 pm)  Patient referred to me by Dr. Tobe Sos for insulin pump initiation and training as well as .PMH significant for T1DM, chronic allergic rhinitis, goiter, acne vulgaris, atopic dermaitis, ADHD, autistic disorder. Patient wears an Omnipod 5 insulin pump and Dexcom G6 CGM. Patient was started the Omnipod 5  insulin pump on 11/23/2021.   I connected with Johnny [name] and his mother Johnny [name]) on 04/25/2022 by telephone and verified that I am speaking with the correct person using two identifiers. Mother has questions related to Dexcom Follow. She states she has not been able to review Alcide's CGM readings via the Dexcom Follow app.   Insurance: [facility] [place] [facility], [place] - [address]  [address], [place] [address]  Phone:  [phone]  Fax:  [phone]  DEA #:  [id number]  DAW Reason: --   Omnipod 5 Pump Settings   Basal (Max: 2 units/hr) 12AM 1.2  4AM 1.1  6PM  1.2                 Total: 27.4 units  Insulin to carbohydrate ratio (ICR)  12AM 18  8AM 15  2PM 15  5PM 15  10PM 18       Max Bolus: 10 units   Insulin Sensitivity Factor (ISF) 12AM 50  8AM 45  10PM 50                     Target and Correct Above BG 12AM 150  8AM 130  10PM 150                   Temp basal: ON Duration of insulin action: 3 hours  Extended bolus: ON Reverse Correction: OFF   O:   Labs:    [place] Report    There were no vitals filed for  this visit.  HbA1c Lab Results  Component Value Date   HGBA1C 7.9 (A) 03/14/2022   HGBA1C 8.3 (A) 10/17/2021   HGBA1C 8.7 (A) 07/20/2021    Pancreatic Islet Cell Autoantibodies Lab Results  Component Value Date   ISLETAB 80 (A) 06/26/2013    Insulin Autoantibodies Lab Results  Component Value Date   INSULINAB <0.4 06/26/2013    Glutamic Acid Decarboxylase Autoantibodies Lab Results  Component Value Date   GLUTAMICACAB >30.0 (H) 06/26/2013    ZnT8 Autoantibodies No results found for: "ZNT8AB"  IA-2 Autoantibodies No results found for: "LABIA2"  C-Peptide Lab Results  Component Value Date   CPEPTIDE 0.64 (L) 10/22/2013    Microalbumin Lab Results  Component Value Date   MICRALBCREAT 3 07/20/2021    Lipids    Component Value Date/Time   CHOL 116 07/20/2021 1521   TRIG 100 07/20/2021 1521   HDL 44 07/20/2021 1521   CHOLHDL 2.6 07/20/2021 1521   VLDL 31 (H) 06/27/2016 0001   LDLCALC 53 07/20/2021 1521    Assessment: Diabetes  Management - Attempted to assist mother with removing herself as a Adult nurse for Dexcom Follow and re-adding herself. On the screen it read that Davian was not connected to wifi. Advised mother to go to iphone settings to see if he was connected to wifi; he was. Advised mother to contact Dexcom technical support at 1 [phone].  This appointment required 15 minutes of patient care (this includes precharting, chart review, review of results, virtual care, etc.).  Time Spent 04/22/22 - 05/22/22: 15 minutes -04/25/22: 15 minutes  Thank you for involving clinical pharmacist/diabetes educator to assist in providing this patient's care.  Drexel Iha, PharmD, BCACP, [place], CPP

## 2022-05-10 ENCOUNTER — Other Ambulatory Visit (INDEPENDENT_AMBULATORY_CARE_PROVIDER_SITE_OTHER): Payer: Self-pay | Admitting: "Endocrinology

## 2022-08-13 ENCOUNTER — Telehealth (INDEPENDENT_AMBULATORY_CARE_PROVIDER_SITE_OTHER): Payer: Self-pay | Admitting: Pharmacist

## 2022-08-13 NOTE — Telephone Encounter (Signed)
  Name of who is calling:Elaine   Caller's Relationship to Patient:mother   Best contact number:[phone]  Provider they see:Dr. Lovena Le   Reason for call:Mom stated that she was told by Dr. Tobe Sos that when they found a endocrinologist to let someone know and it would be sent to were they needed to go. Mom stated that she found one in [place], [place] do a Dr. Austin Miles   [phone] Mom also stated that she was told by Dr.Brennan that we would fill the medication for Eyan until a new provider was found   [place]  Name of prescription:Omni Pod 5 , Novalog will be out soon.  Pharmacy:[facility]

## 2022-08-24 NOTE — Telephone Encounter (Signed)
Spoke with mom. Let her know per Cori. Mom verbalized understanding.

## 2023-01-25 ENCOUNTER — Encounter (INDEPENDENT_AMBULATORY_CARE_PROVIDER_SITE_OTHER): Payer: Self-pay

## 2023-02-07 ENCOUNTER — Encounter (INDEPENDENT_AMBULATORY_CARE_PROVIDER_SITE_OTHER): Payer: Self-pay
